# Patient Record
Sex: Male | Born: 1945 | Race: White | Hispanic: No | Marital: Married | Smoking: Never smoker
Health system: Southern US, Community
[De-identification: ages and names within clinical notes are randomized; demographics above are authoritative.]

## PROBLEM LIST (undated history)

## (undated) DIAGNOSIS — I499 Cardiac arrhythmia, unspecified: Secondary | ICD-10-CM

## (undated) DIAGNOSIS — Z9889 Other specified postprocedural states: Secondary | ICD-10-CM

## (undated) DIAGNOSIS — F32A Depression, unspecified: Secondary | ICD-10-CM

## (undated) DIAGNOSIS — I2510 Atherosclerotic heart disease of native coronary artery without angina pectoris: Secondary | ICD-10-CM

## (undated) DIAGNOSIS — Z8719 Personal history of other diseases of the digestive system: Secondary | ICD-10-CM

## (undated) DIAGNOSIS — F329 Major depressive disorder, single episode, unspecified: Secondary | ICD-10-CM

## (undated) DIAGNOSIS — E785 Hyperlipidemia, unspecified: Secondary | ICD-10-CM

## (undated) HISTORY — DX: Other specified postprocedural states: Z98.890

## (undated) HISTORY — DX: Atherosclerotic heart disease of native coronary artery without angina pectoris: I25.10

## (undated) HISTORY — DX: Depression, unspecified: F32.A

## (undated) HISTORY — DX: Hyperlipidemia, unspecified: E78.5

## (undated) HISTORY — DX: Personal history of other diseases of the digestive system: Z87.19

## (undated) HISTORY — DX: Cardiac arrhythmia, unspecified: I49.9

## (undated) HISTORY — PX: CARDIAC CATHETERIZATION: SHX172

## (undated) HISTORY — PX: CHOLECYSTECTOMY: SHX55

## (undated) HISTORY — DX: Major depressive disorder, single episode, unspecified: F32.9

## (undated) HISTORY — PX: HERNIA REPAIR: SHX51

---

## 1998-03-18 HISTORY — PX: CORONARY ARTERY BYPASS GRAFT: SHX141

## 1998-12-04 ENCOUNTER — Ambulatory Visit (HOSPITAL_COMMUNITY): Admission: RE | Admit: 1998-12-04 | Discharge: 1998-12-04 | Payer: Self-pay | Admitting: Gastroenterology

## 1999-07-31 ENCOUNTER — Emergency Department (HOSPITAL_COMMUNITY): Admission: EM | Admit: 1999-07-31 | Discharge: 1999-07-31 | Payer: Self-pay | Admitting: Emergency Medicine

## 2001-12-07 ENCOUNTER — Ambulatory Visit (HOSPITAL_COMMUNITY): Admission: RE | Admit: 2001-12-07 | Discharge: 2001-12-08 | Payer: Self-pay | Admitting: Cardiovascular Disease

## 2001-12-17 ENCOUNTER — Encounter: Payer: Self-pay | Admitting: Surgery

## 2001-12-21 ENCOUNTER — Encounter: Payer: Self-pay | Admitting: Surgery

## 2001-12-21 ENCOUNTER — Inpatient Hospital Stay (HOSPITAL_COMMUNITY): Admission: RE | Admit: 2001-12-21 | Discharge: 2001-12-26 | Payer: Self-pay | Admitting: Surgery

## 2001-12-22 ENCOUNTER — Encounter: Payer: Self-pay | Admitting: Surgery

## 2001-12-23 ENCOUNTER — Encounter: Payer: Self-pay | Admitting: Surgery

## 2002-01-25 ENCOUNTER — Encounter (HOSPITAL_COMMUNITY): Admission: RE | Admit: 2002-01-25 | Discharge: 2002-04-25 | Payer: Self-pay | Admitting: Cardiovascular Disease

## 2002-04-25 ENCOUNTER — Encounter (HOSPITAL_COMMUNITY): Admission: RE | Admit: 2002-04-25 | Discharge: 2002-07-24 | Payer: Self-pay | Admitting: Cardiovascular Disease

## 2002-08-17 ENCOUNTER — Encounter (HOSPITAL_COMMUNITY): Admission: RE | Admit: 2002-08-17 | Discharge: 2002-11-15 | Payer: Self-pay | Admitting: Cardiovascular Disease

## 2002-11-17 ENCOUNTER — Encounter (HOSPITAL_COMMUNITY): Admission: RE | Admit: 2002-11-17 | Discharge: 2003-02-15 | Payer: Self-pay | Admitting: Cardiovascular Disease

## 2003-02-16 ENCOUNTER — Encounter (HOSPITAL_COMMUNITY): Admission: RE | Admit: 2003-02-16 | Discharge: 2003-05-17 | Payer: Self-pay | Admitting: Cardiovascular Disease

## 2003-06-18 ENCOUNTER — Encounter (HOSPITAL_COMMUNITY): Admission: RE | Admit: 2003-06-18 | Discharge: 2003-09-16 | Payer: Self-pay | Admitting: Cardiovascular Disease

## 2003-09-20 ENCOUNTER — Encounter (HOSPITAL_COMMUNITY): Admission: RE | Admit: 2003-09-20 | Discharge: 2003-12-19 | Payer: Self-pay | Admitting: Cardiovascular Disease

## 2003-12-20 ENCOUNTER — Encounter (HOSPITAL_COMMUNITY): Admission: RE | Admit: 2003-12-20 | Discharge: 2004-03-19 | Payer: Self-pay | Admitting: Cardiovascular Disease

## 2004-03-20 ENCOUNTER — Encounter (HOSPITAL_COMMUNITY): Admission: RE | Admit: 2004-03-20 | Discharge: 2004-06-18 | Payer: Self-pay | Admitting: Cardiovascular Disease

## 2004-06-19 ENCOUNTER — Encounter (HOSPITAL_COMMUNITY): Admission: RE | Admit: 2004-06-19 | Discharge: 2004-09-17 | Payer: Self-pay | Admitting: Cardiovascular Disease

## 2004-09-18 ENCOUNTER — Encounter (HOSPITAL_COMMUNITY): Admission: RE | Admit: 2004-09-18 | Discharge: 2004-12-17 | Payer: Self-pay | Admitting: Cardiovascular Disease

## 2004-12-18 ENCOUNTER — Encounter (HOSPITAL_COMMUNITY): Admission: RE | Admit: 2004-12-18 | Discharge: 2005-03-17 | Payer: Self-pay | Admitting: Cardiovascular Disease

## 2005-03-19 ENCOUNTER — Encounter (HOSPITAL_COMMUNITY): Admission: RE | Admit: 2005-03-19 | Discharge: 2005-06-17 | Payer: Self-pay | Admitting: Cardiovascular Disease

## 2005-06-18 ENCOUNTER — Encounter (HOSPITAL_COMMUNITY): Admission: RE | Admit: 2005-06-18 | Discharge: 2005-09-16 | Payer: Self-pay | Admitting: Cardiovascular Disease

## 2005-09-17 ENCOUNTER — Encounter (HOSPITAL_COMMUNITY): Admission: RE | Admit: 2005-09-17 | Discharge: 2005-12-16 | Payer: Self-pay | Admitting: Cardiovascular Disease

## 2005-12-17 ENCOUNTER — Encounter (HOSPITAL_COMMUNITY): Admission: RE | Admit: 2005-12-17 | Discharge: 2006-03-17 | Payer: Self-pay | Admitting: Cardiovascular Disease

## 2006-03-18 ENCOUNTER — Encounter (HOSPITAL_COMMUNITY): Admission: RE | Admit: 2006-03-18 | Discharge: 2006-06-16 | Payer: Self-pay | Admitting: Cardiovascular Disease

## 2006-06-17 ENCOUNTER — Encounter (HOSPITAL_COMMUNITY): Admission: RE | Admit: 2006-06-17 | Discharge: 2006-09-15 | Payer: Self-pay | Admitting: Cardiovascular Disease

## 2006-09-16 ENCOUNTER — Encounter (HOSPITAL_COMMUNITY): Admission: RE | Admit: 2006-09-16 | Discharge: 2006-12-15 | Payer: Self-pay | Admitting: Cardiovascular Disease

## 2006-12-17 ENCOUNTER — Encounter (HOSPITAL_COMMUNITY): Admission: RE | Admit: 2006-12-17 | Discharge: 2007-03-17 | Payer: Self-pay | Admitting: Cardiovascular Disease

## 2007-03-19 ENCOUNTER — Encounter (HOSPITAL_COMMUNITY): Admission: RE | Admit: 2007-03-19 | Discharge: 2007-04-17 | Payer: Self-pay | Admitting: Cardiovascular Disease

## 2007-04-19 ENCOUNTER — Encounter (HOSPITAL_COMMUNITY): Admission: RE | Admit: 2007-04-19 | Discharge: 2007-07-18 | Payer: Self-pay | Admitting: Cardiovascular Disease

## 2007-07-19 ENCOUNTER — Encounter (HOSPITAL_COMMUNITY): Admission: RE | Admit: 2007-07-19 | Discharge: 2007-10-17 | Payer: Self-pay | Admitting: Cardiovascular Disease

## 2007-10-18 ENCOUNTER — Encounter (HOSPITAL_COMMUNITY): Admission: RE | Admit: 2007-10-18 | Discharge: 2008-01-16 | Payer: Self-pay | Admitting: Cardiovascular Disease

## 2008-01-18 ENCOUNTER — Encounter (HOSPITAL_COMMUNITY): Admission: RE | Admit: 2008-01-18 | Discharge: 2008-04-17 | Payer: Self-pay | Admitting: Cardiovascular Disease

## 2008-04-18 ENCOUNTER — Encounter (HOSPITAL_COMMUNITY): Admission: RE | Admit: 2008-04-18 | Discharge: 2008-07-17 | Payer: Self-pay | Admitting: Cardiovascular Disease

## 2008-07-18 ENCOUNTER — Encounter (HOSPITAL_COMMUNITY): Admission: RE | Admit: 2008-07-18 | Discharge: 2008-10-16 | Payer: Self-pay | Admitting: Cardiovascular Disease

## 2008-10-17 ENCOUNTER — Encounter (HOSPITAL_COMMUNITY): Admission: RE | Admit: 2008-10-17 | Discharge: 2009-01-15 | Payer: Self-pay | Admitting: Cardiovascular Disease

## 2009-01-16 ENCOUNTER — Encounter (HOSPITAL_COMMUNITY): Admission: RE | Admit: 2009-01-16 | Discharge: 2009-04-16 | Payer: Self-pay | Admitting: Cardiovascular Disease

## 2009-04-18 ENCOUNTER — Encounter (HOSPITAL_COMMUNITY): Admission: RE | Admit: 2009-04-18 | Discharge: 2009-07-17 | Payer: Self-pay | Admitting: Cardiovascular Disease

## 2009-07-18 ENCOUNTER — Encounter (HOSPITAL_COMMUNITY): Admission: RE | Admit: 2009-07-18 | Discharge: 2009-10-16 | Payer: Self-pay | Admitting: Cardiovascular Disease

## 2009-10-17 ENCOUNTER — Encounter (HOSPITAL_COMMUNITY): Admission: RE | Admit: 2009-10-17 | Discharge: 2010-01-15 | Payer: Self-pay | Admitting: Cardiovascular Disease

## 2009-12-27 ENCOUNTER — Ambulatory Visit: Payer: Self-pay | Admitting: Cardiovascular Disease

## 2010-01-16 ENCOUNTER — Encounter (HOSPITAL_COMMUNITY)
Admission: RE | Admit: 2010-01-16 | Discharge: 2010-04-16 | Payer: Self-pay | Source: Home / Self Care | Attending: Cardiovascular Disease | Admitting: Cardiovascular Disease

## 2010-02-26 IMAGING — CR DG CHEST 2V
2 series · 2 of 2 positions shown · non-contrast
Comparison: None.

CLINICAL DATA: Preop for right inguinal hernia repair

CHEST - 2 VIEW

[w chest pa]
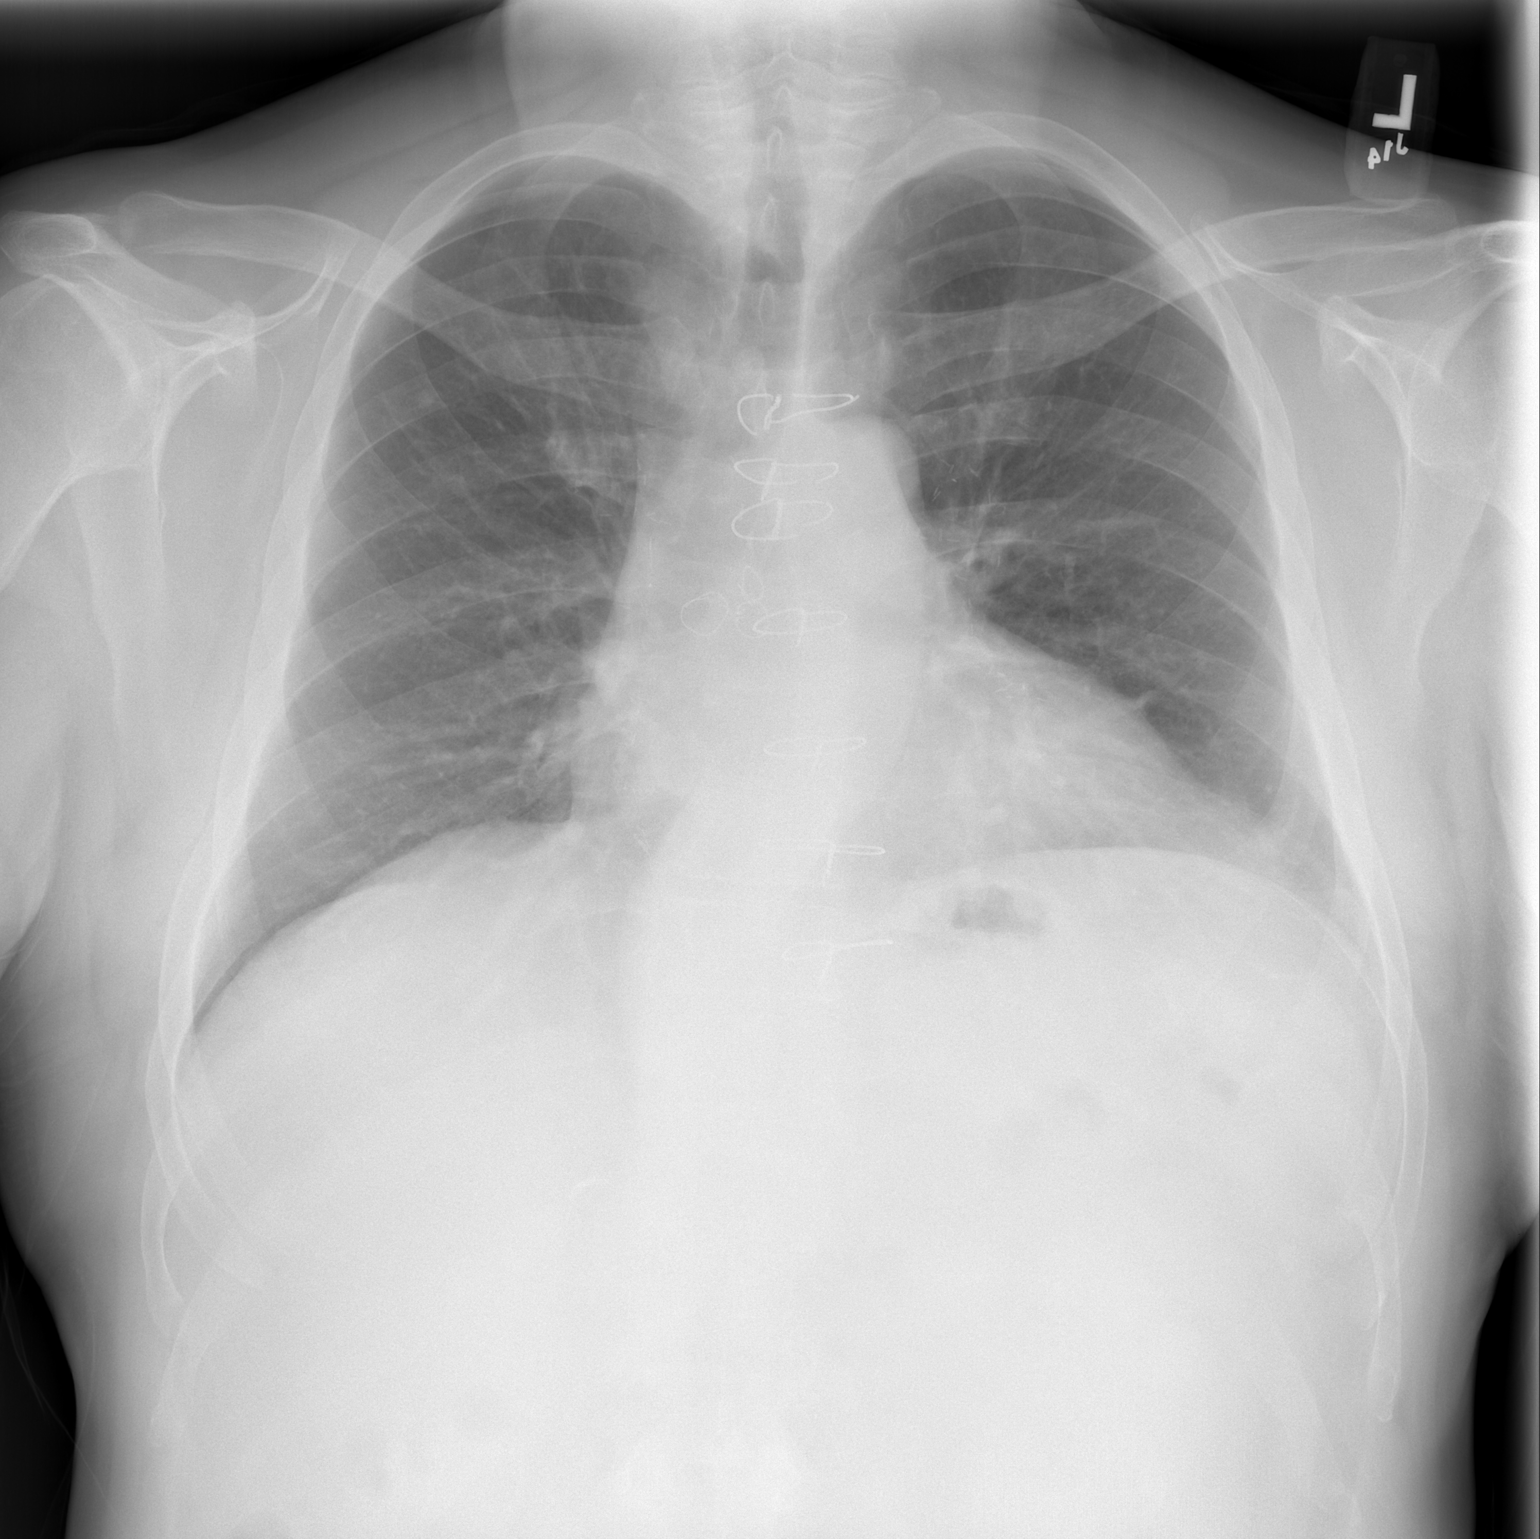

[w chest lat]
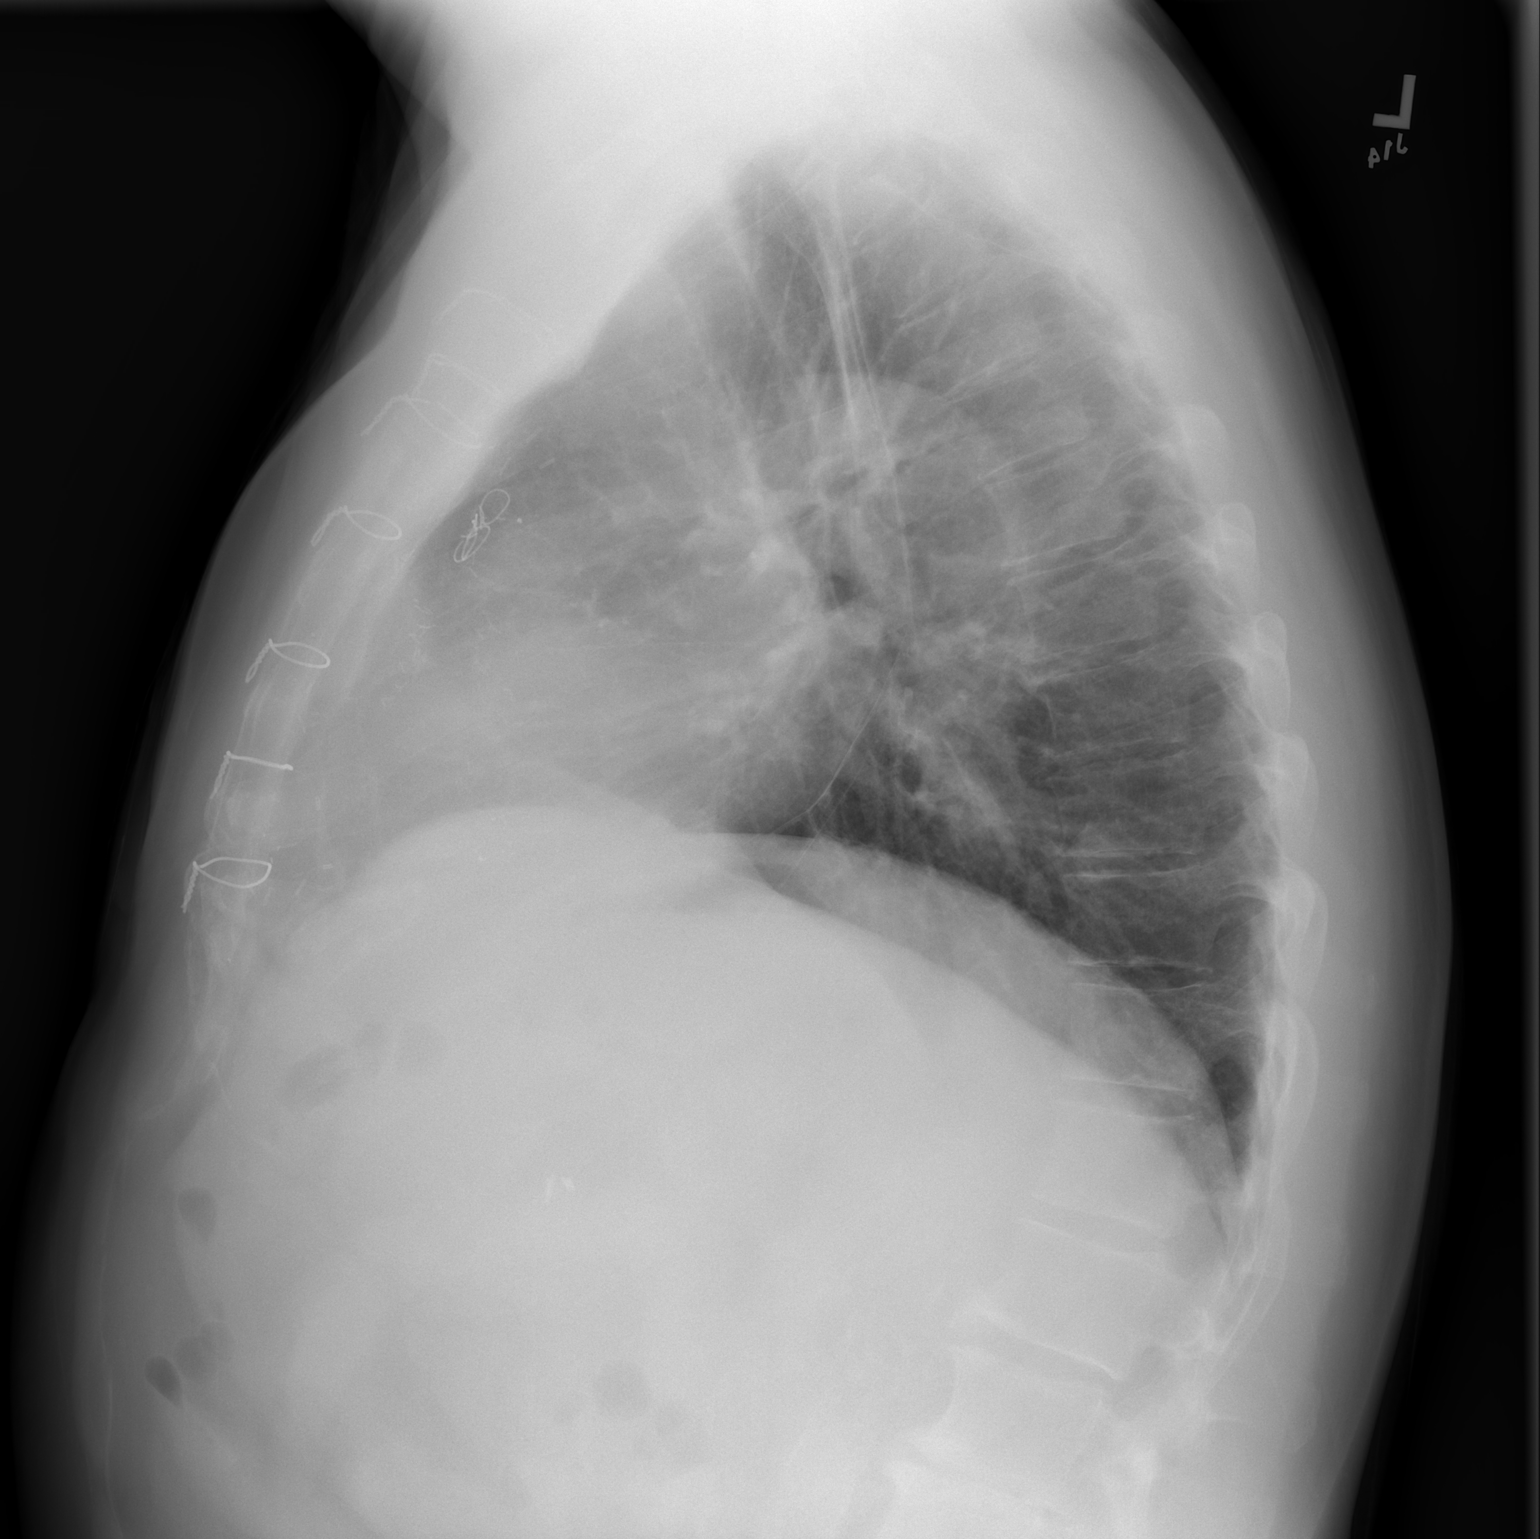

[2 of 2 positions shown; findings below may reference images not displayed]

FINDINGS: Post CABG.  Heart size within normal limits.  Aorta
tortuous.  Lungs clear.  Osseous structures intact.
IMPRESSION: Post CABG - no active disease.

## 2010-02-28 ENCOUNTER — Ambulatory Visit (HOSPITAL_COMMUNITY)
Admission: RE | Admit: 2010-02-28 | Discharge: 2010-02-28 | Payer: Self-pay | Source: Home / Self Care | Attending: Surgery | Admitting: Surgery

## 2010-04-18 ENCOUNTER — Ambulatory Visit (HOSPITAL_COMMUNITY): Payer: Medicare Other | Attending: Cardiovascular Disease

## 2010-04-19 ENCOUNTER — Encounter (HOSPITAL_COMMUNITY): Payer: Self-pay

## 2010-04-19 ENCOUNTER — Ambulatory Visit (HOSPITAL_COMMUNITY): Payer: Self-pay

## 2010-04-23 ENCOUNTER — Encounter (HOSPITAL_COMMUNITY): Payer: Medicare Other | Attending: Cardiovascular Disease

## 2010-04-23 DIAGNOSIS — I2510 Atherosclerotic heart disease of native coronary artery without angina pectoris: Secondary | ICD-10-CM | POA: Insufficient documentation

## 2010-04-23 DIAGNOSIS — Z8249 Family history of ischemic heart disease and other diseases of the circulatory system: Secondary | ICD-10-CM | POA: Insufficient documentation

## 2010-04-23 DIAGNOSIS — E669 Obesity, unspecified: Secondary | ICD-10-CM | POA: Insufficient documentation

## 2010-04-23 DIAGNOSIS — E7800 Pure hypercholesterolemia, unspecified: Secondary | ICD-10-CM | POA: Insufficient documentation

## 2010-04-23 DIAGNOSIS — Z951 Presence of aortocoronary bypass graft: Secondary | ICD-10-CM | POA: Insufficient documentation

## 2010-04-23 DIAGNOSIS — I10 Essential (primary) hypertension: Secondary | ICD-10-CM | POA: Insufficient documentation

## 2010-04-23 DIAGNOSIS — Z5189 Encounter for other specified aftercare: Secondary | ICD-10-CM | POA: Insufficient documentation

## 2010-04-24 ENCOUNTER — Encounter (HOSPITAL_COMMUNITY): Payer: Self-pay | Attending: Cardiovascular Disease

## 2010-04-25 ENCOUNTER — Encounter (HOSPITAL_COMMUNITY): Payer: Self-pay

## 2010-04-26 ENCOUNTER — Encounter (HOSPITAL_COMMUNITY): Payer: Self-pay

## 2010-05-01 ENCOUNTER — Encounter (HOSPITAL_COMMUNITY): Payer: Self-pay

## 2010-05-02 ENCOUNTER — Encounter (HOSPITAL_COMMUNITY): Payer: Self-pay

## 2010-05-03 ENCOUNTER — Encounter (HOSPITAL_COMMUNITY): Payer: Self-pay

## 2010-05-04 ENCOUNTER — Encounter (HOSPITAL_COMMUNITY): Payer: Self-pay

## 2010-05-08 ENCOUNTER — Encounter (HOSPITAL_COMMUNITY): Payer: Self-pay

## 2010-05-09 ENCOUNTER — Encounter (HOSPITAL_COMMUNITY): Payer: Self-pay

## 2010-05-10 ENCOUNTER — Encounter (HOSPITAL_COMMUNITY): Payer: Self-pay

## 2010-05-15 ENCOUNTER — Encounter (HOSPITAL_COMMUNITY): Payer: Self-pay

## 2010-05-16 ENCOUNTER — Encounter (HOSPITAL_COMMUNITY): Payer: Self-pay

## 2010-05-17 ENCOUNTER — Encounter (HOSPITAL_COMMUNITY): Payer: Self-pay | Attending: Cardiovascular Disease

## 2010-05-17 DIAGNOSIS — I10 Essential (primary) hypertension: Secondary | ICD-10-CM | POA: Insufficient documentation

## 2010-05-17 DIAGNOSIS — I2510 Atherosclerotic heart disease of native coronary artery without angina pectoris: Secondary | ICD-10-CM | POA: Insufficient documentation

## 2010-05-17 DIAGNOSIS — E669 Obesity, unspecified: Secondary | ICD-10-CM | POA: Insufficient documentation

## 2010-05-17 DIAGNOSIS — Z5189 Encounter for other specified aftercare: Secondary | ICD-10-CM | POA: Insufficient documentation

## 2010-05-17 DIAGNOSIS — Z951 Presence of aortocoronary bypass graft: Secondary | ICD-10-CM | POA: Insufficient documentation

## 2010-05-17 DIAGNOSIS — Z8249 Family history of ischemic heart disease and other diseases of the circulatory system: Secondary | ICD-10-CM | POA: Insufficient documentation

## 2010-05-17 DIAGNOSIS — E7800 Pure hypercholesterolemia, unspecified: Secondary | ICD-10-CM | POA: Insufficient documentation

## 2010-05-21 ENCOUNTER — Encounter (HOSPITAL_COMMUNITY): Payer: Self-pay

## 2010-05-22 ENCOUNTER — Encounter (HOSPITAL_COMMUNITY): Payer: Self-pay

## 2010-05-23 ENCOUNTER — Encounter (HOSPITAL_COMMUNITY): Payer: Self-pay

## 2010-05-24 ENCOUNTER — Encounter (HOSPITAL_COMMUNITY): Payer: Self-pay

## 2010-05-29 ENCOUNTER — Encounter (HOSPITAL_COMMUNITY): Payer: Self-pay

## 2010-05-29 LAB — BASIC METABOLIC PANEL
BUN: 14 mg/dL (ref 6–23)
CO2: 28 mEq/L (ref 19–32)
Calcium: 9.4 mg/dL (ref 8.4–10.5)
Chloride: 105 mEq/L (ref 96–112)
Creatinine, Ser: 0.88 mg/dL (ref 0.4–1.5)
GFR calc Af Amer: >60 mL/min (ref >60)
GFR calc non Af Amer: >60 mL/min (ref >60)
Glucose, Bld: 146 mg/dL — ABNORMAL HIGH (ref 70–99)
Potassium: 4 mEq/L (ref 3.5–5.1)
Sodium: 139 mEq/L (ref 135–145)

## 2010-05-29 LAB — SURGICAL PCR SCREEN
MRSA, PCR: NEGATIVE
Staphylococcus aureus: POSITIVE — AB

## 2010-05-30 ENCOUNTER — Encounter (HOSPITAL_COMMUNITY): Payer: Self-pay

## 2010-05-31 ENCOUNTER — Encounter (HOSPITAL_COMMUNITY): Payer: Self-pay

## 2010-06-01 ENCOUNTER — Encounter (HOSPITAL_COMMUNITY): Payer: Self-pay

## 2010-06-05 ENCOUNTER — Encounter (HOSPITAL_COMMUNITY): Payer: Self-pay

## 2010-06-07 ENCOUNTER — Encounter (HOSPITAL_COMMUNITY): Payer: Self-pay

## 2010-06-08 ENCOUNTER — Encounter (HOSPITAL_COMMUNITY): Payer: Self-pay

## 2010-06-12 ENCOUNTER — Encounter (HOSPITAL_COMMUNITY): Payer: Self-pay

## 2010-06-13 ENCOUNTER — Encounter (HOSPITAL_COMMUNITY): Payer: Self-pay

## 2010-06-14 ENCOUNTER — Encounter (HOSPITAL_COMMUNITY): Payer: Self-pay

## 2010-06-19 ENCOUNTER — Encounter (HOSPITAL_COMMUNITY): Payer: Self-pay | Attending: Cardiovascular Disease

## 2010-06-19 DIAGNOSIS — Z8249 Family history of ischemic heart disease and other diseases of the circulatory system: Secondary | ICD-10-CM | POA: Insufficient documentation

## 2010-06-19 DIAGNOSIS — E669 Obesity, unspecified: Secondary | ICD-10-CM | POA: Insufficient documentation

## 2010-06-19 DIAGNOSIS — Z5189 Encounter for other specified aftercare: Secondary | ICD-10-CM | POA: Insufficient documentation

## 2010-06-19 DIAGNOSIS — E7800 Pure hypercholesterolemia, unspecified: Secondary | ICD-10-CM | POA: Insufficient documentation

## 2010-06-19 DIAGNOSIS — I2510 Atherosclerotic heart disease of native coronary artery without angina pectoris: Secondary | ICD-10-CM | POA: Insufficient documentation

## 2010-06-19 DIAGNOSIS — I10 Essential (primary) hypertension: Secondary | ICD-10-CM | POA: Insufficient documentation

## 2010-06-19 DIAGNOSIS — Z951 Presence of aortocoronary bypass graft: Secondary | ICD-10-CM | POA: Insufficient documentation

## 2010-06-20 ENCOUNTER — Encounter (HOSPITAL_COMMUNITY): Payer: Self-pay

## 2010-06-21 ENCOUNTER — Encounter (HOSPITAL_COMMUNITY): Payer: Self-pay

## 2010-06-26 ENCOUNTER — Encounter (HOSPITAL_COMMUNITY): Payer: Self-pay

## 2010-06-27 ENCOUNTER — Encounter (HOSPITAL_COMMUNITY): Payer: Self-pay

## 2010-06-28 ENCOUNTER — Encounter (HOSPITAL_COMMUNITY): Payer: Self-pay

## 2010-07-02 ENCOUNTER — Encounter: Payer: Self-pay | Admitting: *Deleted

## 2010-07-03 ENCOUNTER — Encounter: Payer: Self-pay | Admitting: Cardiovascular Disease

## 2010-07-03 ENCOUNTER — Encounter (HOSPITAL_COMMUNITY): Payer: Self-pay

## 2010-07-03 ENCOUNTER — Ambulatory Visit (INDEPENDENT_AMBULATORY_CARE_PROVIDER_SITE_OTHER): Payer: Medicare Other | Admitting: Cardiovascular Disease

## 2010-07-03 VITALS — BP 132/70 | HR 60 | Wt 186.0 lb

## 2010-07-03 DIAGNOSIS — F419 Anxiety disorder, unspecified: Secondary | ICD-10-CM | POA: Insufficient documentation

## 2010-07-03 DIAGNOSIS — F411 Generalized anxiety disorder: Secondary | ICD-10-CM

## 2010-07-03 DIAGNOSIS — E785 Hyperlipidemia, unspecified: Secondary | ICD-10-CM | POA: Insufficient documentation

## 2010-07-03 DIAGNOSIS — I2510 Atherosclerotic heart disease of native coronary artery without angina pectoris: Secondary | ICD-10-CM | POA: Insufficient documentation

## 2010-07-03 NOTE — Progress Notes (Signed)
Fransisca Kaufmann Date of Birth  [birth year] [facility] / [facility] [address]     [address] [place], [place]  [address] [phone]  Fax  [phone]  History of Present Illness:  Pharoah is a middle-aged gentleman with a history of coronary artery disease. He status post CABG. He has a history of dyslipidemia and atrial fibrillation. He also has problems with depression. He denies any chest pain or shortness breath. He still works out at the right MCA and is anticipating in cardiac rehabilitation.  Current Outpatient Prescriptions on File Prior to Visit  Medication Sig Dispense Refill  . aspirin 325 MG EC tablet Take 325 mg by mouth daily.        Marland Kitchen atorvastatin (LIPITOR) 20 MG tablet Take 20 mg by mouth daily.        . fish oil-omega-3 fatty acids 1000 MG capsule Take 1 g by mouth daily.       . Flaxseed, Linseed, (FLAX SEED OIL PO) Take by mouth daily.        . irbesartan (AVAPRO) 75 MG tablet Take 75 mg by mouth at bedtime.        . Multiple Vitamin (MULTIVITAMIN PO) Take by mouth daily.        . ranitidine (ZANTAC) 150 MG capsule Take 150 mg by mouth every evening.        . TRAZODONE HCL PO Take 50 mg by mouth at bedtime as needed.        Marland Kitchen DISCONTD: atenolol (TENORMIN) 25 MG tablet Take 25 mg by mouth daily.          Allergies  Allergen Reactions  . Lisinopril   . Penicillins     Past Medical History  Diagnosis Date  . Hyperlipidemia   . Arrhythmia     afib  . Depression   . Coronary artery disease     2003  . S/P hernia surgery     Past Surgical History  Procedure Date  . Hernia repair   . Cholecystectomy   . Cardiac catheterization     11/2001  . Coronary artery bypass graft 2000    History  Smoking status  . Never Smoker   Smokeless tobacco  . Not on file    History  Alcohol Use No    Family History  Problem Relation Age of Onset  . Heart disease Father     Reviw of Systems:  Reviewed in the HPI.  All other  systems are negative.  Physical Exam: BP 132/70  Pulse 60  Wt 186 lb (84.369 kg) The patient is alert and oriented x 3.  The mood and affect are normal.  The skin is warm and dry.  Color is normal.  The HEENT exam reveals that the sclera are nonicteric.  The mucous membranes are moist.  The carotids are 2+ without bruits.  There is no thyromegaly.  There is no JVD.  The lungs are clear.  The chest wall is non tender.  The heart exam reveals a regular rate with a normal S1 and S2.  There are no murmurs, gallops, or rubs.  The PMI is not displaced.   Abdominal exam reveals good bowel sounds.  There is no guarding or rebound.  There is no hepatosplenomegaly or tenderness.  There are no masses.  Exam of the legs reveal no clubbing, cyanosis, or edema.  The legs are without rashes.  The distal pulses are intact.  Cranial nerves II -  XII are intact.  Motor and sensory functions are intact.  The gait is normal.  Assessment / Plan:

## 2010-07-03 NOTE — Patient Instructions (Signed)
Call William Hernandez  [phone]) if you need to discuss your stress issues.

## 2010-07-03 NOTE — Assessment & Plan Note (Signed)
His lipid levels have been well controlled. We will check a lipid profile, basic metabolic profile, and hepatic profile in 6 months along with his office visit.

## 2010-07-03 NOTE — Assessment & Plan Note (Signed)
William Hernandez is doing quite well from a cardiac standpoint. He denies any chest pain or shortness of breath. He's having some chest wall pain that is probably due to costochondritis. He is not having any angina symptoms. We'll continue with his same medications. I'll see him again in 6 months.

## 2010-07-04 ENCOUNTER — Encounter (HOSPITAL_COMMUNITY): Payer: Self-pay

## 2010-07-05 ENCOUNTER — Encounter (HOSPITAL_COMMUNITY): Payer: Self-pay

## 2010-07-10 ENCOUNTER — Encounter (HOSPITAL_COMMUNITY): Payer: Self-pay

## 2010-07-11 ENCOUNTER — Encounter (HOSPITAL_COMMUNITY): Payer: Self-pay

## 2010-07-12 ENCOUNTER — Encounter (HOSPITAL_COMMUNITY): Payer: Self-pay

## 2010-07-17 ENCOUNTER — Encounter (HOSPITAL_COMMUNITY): Payer: Self-pay | Attending: Cardiovascular Disease

## 2010-07-17 DIAGNOSIS — E669 Obesity, unspecified: Secondary | ICD-10-CM | POA: Insufficient documentation

## 2010-07-17 DIAGNOSIS — E7800 Pure hypercholesterolemia, unspecified: Secondary | ICD-10-CM | POA: Insufficient documentation

## 2010-07-17 DIAGNOSIS — Z8249 Family history of ischemic heart disease and other diseases of the circulatory system: Secondary | ICD-10-CM | POA: Insufficient documentation

## 2010-07-17 DIAGNOSIS — I2510 Atherosclerotic heart disease of native coronary artery without angina pectoris: Secondary | ICD-10-CM | POA: Insufficient documentation

## 2010-07-17 DIAGNOSIS — Z951 Presence of aortocoronary bypass graft: Secondary | ICD-10-CM | POA: Insufficient documentation

## 2010-07-17 DIAGNOSIS — I10 Essential (primary) hypertension: Secondary | ICD-10-CM | POA: Insufficient documentation

## 2010-07-17 DIAGNOSIS — Z5189 Encounter for other specified aftercare: Secondary | ICD-10-CM | POA: Insufficient documentation

## 2010-07-18 ENCOUNTER — Encounter (HOSPITAL_COMMUNITY): Payer: Self-pay

## 2010-07-19 ENCOUNTER — Encounter (HOSPITAL_COMMUNITY): Payer: Self-pay

## 2010-07-24 ENCOUNTER — Encounter (HOSPITAL_COMMUNITY): Payer: Self-pay

## 2010-07-25 ENCOUNTER — Encounter (HOSPITAL_COMMUNITY): Payer: Self-pay

## 2010-07-26 ENCOUNTER — Encounter (HOSPITAL_COMMUNITY): Payer: Self-pay

## 2010-07-31 ENCOUNTER — Encounter (HOSPITAL_COMMUNITY): Payer: Self-pay

## 2010-08-01 ENCOUNTER — Encounter (HOSPITAL_COMMUNITY): Payer: Self-pay

## 2010-08-02 ENCOUNTER — Encounter (HOSPITAL_COMMUNITY): Payer: Self-pay

## 2010-08-03 NOTE — Discharge Summary (Signed)
NAME:  William Hernandez, William Hernandez NO.:  [id number]   MEDICAL RECORD NO.:  [id number]                   PATIENT TYPE:  INP   LOCATION:  2001                                 FACILITY:  [facility]   PHYSICIAN:  Evelene Croon, M.D.                  DATE OF BIRTH:  04-13-1945   DATE OF ADMISSION:  12/21/2001  DATE OF DISCHARGE:  12/26/2001                                 DISCHARGE SUMMARY   ADMISSION DIAGNOSIS:  Three vessel coronary artery disease.   PAST MEDICAL HISTORY:  1. Hypertension  2. Hypercholesterolemia  3. Surgical history includes left inguinal hernia repair  4. This patient is allergic to penicillin   DISCHARGE DIAGNOSIS:  Significant three vessel coronary artery disease,  status post coronary artery bypass grafting.   BRIEF HISTORY:  Mr. Heikes is a 65 year old gentleman.  In early September  2003 he developed severe right sided chest pain and sought medical  attention. His physician recommended a Cardiolite scan. This showed apical  attenuation of unclear significance.  Since his chest pains were recurrent,  he underwent cardiac catheterization on December 07, 2001. This revealed  significant three vessel coronary artery disease.  This was not amenable to  PCI, and therefore CVTS consultation was requested.  Mr. Holsomback was evaluated  by Dr. Evelene Croon, in the CVTS office on December 15, 2001.  After  examination of the patient revealed all available records including  catheterization films, Dr. Laneta Simmers recommended coronary artery bypass  grafting as the preferred treatment of choice for this gentleman.  The  procedure, risks and benefits were discussed with Mr. Legate and he agreed to  proceed.   HOSPITAL COURSE:  On December 21, 2001, Mr. Flanagin was electively admitted to  [facility] under the care of Dr. Evelene Croon.  He underwent the  following surgical procedure.  Coronary artery bypass grafting times six.  Grafts placed at the time of the  procedure were left internal mammary artery  grafted to the left anterior descending artery, right internal mammary  artery  grafted to the posterior descending artery, saphenous vein grafted  to the first diagonal artery, saphenous vein grafted to the second diagonal  artery, saphenous vein was grafted in a sequential fashion to the first  obtuse marginal and the second obtuse marginal arteries.  Vein was harvested  from bilateral thighs via the intra vein harvesting technique.  Mr. Goins  tolerated this procedure reasonably well, and was transferred in stable  condition to the SICU.   Mr. Czerniak has remained hemodynamically stable since surgery.  History of  postoperative course has been eventful only for some atrial fibrillation.  On the afternoon December 23, 2001 postoperative day two, Mr. Borboa  experienced recurrent atrial fibrillation with his heart rates 140 to 178  range, history of  blood pressure was stable, and he was essentially  asymptomatic.  Dr. Laneta Simmers initiated amiodarone for  treatment of his  arrhythmia. He also initiated anticoagulation therapy with Lovenox.  The  next day in the afternoon he did become symptomatic from RAF, Cardizem was  initiated in an attempt to lower his heart rate.  He converted back to  normal sinus rhythm later in the day and remained there.   On October 8, he also was noted to have a fever of 100.5. His chest x-ray  revealed consolidation in his right lower lobe.  Tequin antibiotic was  initiated.  He was afebrile by the morning of October 10, and the Tequin was  continued due to possible effect QTC with the amiodarone.   Overall, Mr. Bomar made very good progress in recovering from his surgery.  On the morning of October 11, on postoperative day five his vital signs were  stable and he was afebrile. His heart remained in normal sinus rhythm and  his lungs were clear.  His bowel and bladder habits were within normal  limits for him.  His  incisions were healing well, and his pain was well  controlled, and he was ambulating independently.  Mr. Parton was read for  discharge on the morning of December 26, 2001.   LABORATORY DATA:  Recent laboratory studies on October 8, CBC revealed white  blood cell count 11.3, hemoglobin 10.8, hematocrit 31.3, platelet count  126,000.  On December 24, 2001 chemistries revealed sodium 137, potassium 3.9,  chloride 100, CO2 28, BUN 11, creatinine 1.0, glucose 160, calcium 8.8.   CONDITION ON DISCHARGE:  Improved.   DISCHARGE INSTRUCTIONS:  Include medications coated aspirin 325 mg P.O. QD -  this was a new dose for him, Tylox one to two P.O. four to six hours prn for  moderate-to-severe pain or Tylenol one to two P.O. q. four to six hours prn  for mild-to-moderate pain.  Amiodarone 200 mg P.O. QD.  He is instructed to  resume his following home medications: atenolol 25 mg P.O. QD, ranitidine  150 mg P.O. q evening . Lipitor 10 mg P.O. q evening, trazodone 25 mg P.O. q  evening and a multivitamin daily.   ACTIVITY:  He has been asked to refrain from any driving or any heaving  lifting, pushing or pulling.  He has also been instructed to continue his  breathing exercises and daily walking.   DIET:  Should be low fat and low salt.   WOUND CARE:  He may shower with mild soap and water.  He has been instructed  to inspect his incisions daily. If they become red, hot, swollen, draining  or he has a fever of greater than 101 degrees Farenheit, he is to call Dr.  Sharee Pimple office.   FOLLOW UP:  1.Dr. Elease Hashimoto would like to see him in the office in  approximately two weeks. He has been asked to call to arrange the  appointment.  He will have a chest x-ray taken that day.  1. He has an appointment to see Dr. Laneta Simmers at the CVTS office in     approximately three weeks.  The office will call with the date and time    for that appointment. He has been asked to bring his chest x-ray from Dr.     Harvie Bridge  office to this appointment with Dr. Evelene Croon, M.D..  Evelene Croon, M.D.    BB/MEDQ  D:  02/01/2002  T:  02/02/2002  Job:  [id number]   cc:   Vesta Mixer, M.D.   Windle Guard, M.D.  [address]  [place], [place] [address]  Fax: [phone]

## 2010-08-03 NOTE — Cardiovascular Report (Signed)
NAME:  William Hernandez, William Hernandez NO.:  [id number]   MEDICAL RECORD NO.:  [id number]                   PATIENT TYPE:  OIB   LOCATION:  6525                                 FACILITY:  [facility]   PHYSICIAN:  Vesta Mixer, M.D.              DATE OF BIRTH:  1945/12/15   DATE OF PROCEDURE:  12/07/2001  DATE OF DISCHARGE:                              CARDIAC CATHETERIZATION   INDICATIONS:  The patient is a 65 year old gentleman with a history of chest  pain. For the past year he has had an abnormal Cardiolite study which has  revealed apical attenuation.  He is referred for a heart catheterization  after having worsening episodes of chest pain.   PROCEDURE:  Left heart catheterization with an attempted percutaneous  transluminal coronary angioplasty of his left anterior descending.   DESCRIPTION OF PROCEDURE:  The right femoral artery was easily cannulated  using the modified Seldinger technique.   HEMODYNAMICS:  The left ventricular pressure is 131/12 with an aortic  pressure of  131/68.   ANGIOGRAPHY:  His left main coronary artery has minor luminal  irregularities.   The left anterior descending artery has minor luminal irregularities  proximally.  The mid vessel is quite tortuous and then has an 80-90%  stenosis at the origin of the second diagonal vessel.  The remainder of the  LAD has only minor luminal irregularities.   The first diagonal vessel is a moderate to large vessel with no significant  irregularities. The second diagonal vessel originates at the site of the  tight LAD stenosis and is approximately 1 mm in size.   The left circumflex artery is a large and codominant vessel. There is a 50%  proximal stenosis.  This stenosis is quite eccentric.  In the middle of the  stenosis, the first obtuse marginal arises and is seen to be subtotally  occluded.  The circumflex marginal fills predominately through left to left  collateral filling.  The size  of this vessel is approximately 1.5 to 2 mm in  size.  The remainder of the left circumflex artery has minor luminal  irregularities.  There is a 40-50% stenosis at the distal end of the  circumflex artery prior to giving off a posterior descending artery.   The right coronary artery is small to moderate in size.  There is a 50-60%  stenosis in the proximal aspect of this vessel.  The vessel provides flow to  a moderate sized posterior descending artery.   LEFT VENTRICULOGRAM:  The left ventriculogram was performed in a 30 RAO  position. It reveals a mildly dilated left ventricle but with well preserved  left ventricular systolic function.  There is no mitral regurgitation.  Ejection fraction is approximately 50%.   PERCUTANEOUS TRANSLUMINAL CORONARY ANGIOPLASTY PROCEDURE:  The 6 French  sheath was traded out for a 7 [place] sheath.  A Judkins left 3.5 guide was  used  to engage the left main coronary artery.  We gave him 4500 units of  heparin followed by a double bolus Integrilin drip.  We made multiple passes  with various guide wires. We were not able to cross the mid LAD stenosis.  The wires kept preferentially fusing to go down the diagonal vessel.  After  multiple attempts and inducing some coronary spasm, we decided to stop the  procedure.  We  will review the films and consider an alternative approach.  The patient  does have a left circumflex marginal stenosis/occlusion and a moderate  stenosis in this right coronary artery.  We may need to consider coronary  artery bypass grafting.  We will discuss these options with the patient.                                                Vesta Mixer, M.D.    PJN/MEDQ  D:  12/07/2001  T:  12/08/2001  Job:  [id number]   cc:   [facility]   Windle Guard, M.D.

## 2010-08-03 NOTE — H&P (Signed)
NAME:  DELMORE, SEAR NO.:  [id number]   MEDICAL RECORD NO.:  [id number]                   PATIENT TYPE:  OIB   LOCATION:  6532                                 FACILITY:  [facility]   PHYSICIAN:  Vesta Mixer, M.D.              DATE OF BIRTH:  [birth year]   DATE OF ADMISSION:  12/07/2001  DATE OF DISCHARGE:                                HISTORY & PHYSICAL   HISTORY OF PRESENT ILLNESS:  The patient is a middle-aged gentleman with a  history of chest pain and an abnormal Cardiolite study.  He is referred for  cardiac catheterization for further evaluation of these abnormalities.   The patient is a middle-aged gentleman who has had some intermittent  episodes of chest pain over the years.  He had a stress Cardiolite study  approximately six months ago which was mildly abnormal.  He was found to  have apical attenuation consistent with either chest wall attenuation versus  a scar.   The patient has continued to have some intermittent episodes of chest pain  and arm pain.  These episodes of pain occur at various times and are not  necessarily associated with eating, drinking, changing position, or taking a  deep breath.  He notes these episodes of pain both with rest and exertion  and they typically last for several minutes.   MEDICATIONS:  1. Hydrochlorothiazide 25 mg a day.  2. Aspirin 325 mg a day.  3. Vitamin E and multivitamin once a day.  4. Celebrex as needed.  5. Garlic 300 mg a day.  6. Caltrate once a day.  7. Atenolol 25 mg a day.  8. Ranitidine 150 mg a day.  9. Trazodone 50 mg q.h.s.  10.      Lipitor q.h.s.   ALLERGIES:  PENICILLIN.   PAST MEDICAL HISTORY:  1. Hypertension.  2. Hypercholesterolemia.  3. History of chest pain.   SOCIAL HISTORY:  The patient does not smoke.  He does not drink alcohol.   FAMILY HISTORY:  Noncontributory.   REVIEW OF SYSTEMS:  Essentially negative.   PHYSICAL EXAMINATION:  GENERAL:  He is a  middle-aged gentleman in no acute  distress.  He is alert and oriented x3 and his mood and affect are normal.  VITAL SIGNS:  Weight 202, blood pressure 130/70, heart rate 80.  HEENT:  2+ carotids.  No bruits.  No JVD and no thyromegaly.  LUNGS:  Clear to auscultation.  HEART:  Regular rate and rhythm, S1 and S2 with no murmurs, rubs, or  gallops.  ABDOMEN:  Good bowel sounds and nontender.  EXTREMITIES:  No cyanosis, clubbing, or edema.  NEUROLOGY:  Nonfocal.   The patient presents with persistently abnormal Cardiolite study and some  episodes of chest pain.  The most recent Cardiolite study performed last  week revealed the persistent scar.  In addition, his left ventricular  systolic function has decreased  slightly.  For further evaluation, I would  prefer that we perform a cardiac catheterization. We have discussed the  risks, benefits, and options of heart catheterization. He understands and  agrees to proceed.                                               Vesta Mixer, M.D.    PJN/MEDQ  D:  12/04/2001  T:  12/07/2001  Job:  [id number]   cc:   Windle Guard, M.D.

## 2010-08-03 NOTE — Op Note (Signed)
NAME:  William Hernandez, William Hernandez NO.:  [id number]   MEDICAL RECORD NO.:  [id number]                   PATIENT TYPE:  INP   LOCATION:  2309                                 FACILITY:  [facility]   PHYSICIAN:  Evelene Croon, MD                    DATE OF BIRTH:  05-17-1945   DATE OF PROCEDURE:  12/21/2001  DATE OF DISCHARGE:                                 OPERATIVE REPORT   PREOPERATIVE DIAGNOSIS:  Severe three vessel coronary artery disease with  unstable angina.   POSTOPERATIVE DIAGNOSIS:  Severe three vessel coronary artery disease with  unstable angina.   OPERATION:  Median sternotomy, extracorporeal circulation, coronary artery  bypass graft surgery times six using a left internal mammary artery graft to  the left anterior descending coronary artery, with a right internal mammary  artery graft to the posterior descending branch of the right coronary  artery, a saphenous vein graft to the first diagonal branch of the left  anterior descending artery, a saphenous vein graft to the second diagonal  branch of the left anterior descending artery, and a sequential saphenous  vein graft to the first and second obtuse marginal branches of the left  circumflex coronary artery.  Endoscopic vein harvesting from both thighs.   SURGEON:  Evelene Croon, M.D.   ASSISTANT:  Debby Freiberg. Thomasena Edis, P.A.   ANESTHESIA:  General endotracheal   CLINICAL HISTORY:  This patient is a 65 year old gentleman with a several  year history of intermittent chest pain.  He developed severe right sided  chest pain on Labor Day radiating into the right neck.  He underwent a  stress Cardiolite scan that showed apical attenuation of unclear  significance.  Due to these recurring episodes of chest pain, he underwent  cardiac catheterization on December 07, 2001.  This showed significant  three vessel coronary artery disease.  The LAD had diffuse proximal 50%  stenosis.  There was a first diagonal  branch that had about 50% focal  stenosis proximally.  The second diagonal branch came off the LAD in an area  of 80% to 90% stenosis.  The left circumflex had subtotal occlusion on the  first marginal branch.  The distal left circumflex had 40 to 50% stenosis  before a second marginal branch.  The right coronary artery had diffuse  proximal stenosis of about 50 to 60%.  Left ventricular ejection fraction  was about 50% with mild ventricular dilatation.  There was no mitral  regurgitation.  After review of the angiogram and examination of the  patient, it was felt that coronary artery bypass graft surgery was the best  treatment.  I discussed the operative procedure with the patient, including  alternatives, benefits and risks including bleeding, blood transfusion,  infection, stroke, myocardial infarction, and death.  He understood and  agreed to proceed.   DESCRIPTION OF PROCEDURE:  The patient was taken to  the operating room and  placed on the table in the supine position.  After induction of general  endotracheal anesthesia, a  Foley catheter was placed in the bladder using  sterile technique.  Then the chest, abdomen and both lower extremities were  prepped and draped in the usual sterile manner.  The chest was entered  through a median sternotomy incision and the pericardium opened in the  midline.  Examination of the heart showed good ventricular contractility.  The ascending aorta had no palpable plaques in it.   Then the left internal mammary artery was harvested from the chest wall as a  pedicle graft.  This is a medium caliber vessel with excellent flow through  it.  At the same time, a segment of greater saphenous vein was harvested  from the right thigh.  Endoscopic vein harvest technique was used. Part of  this vein in the upper thigh was of sufficient caliber to use.  About the  mid thigh, the vein was small and very thin walled and not felt to be  suitable.  Therefore,  another section of saphenous vein was harvested from  the left leg using endoscopic vein harvest technique.  This vein was of  medium size and good quality.  Since there was limited vein to use, I  decided to harvest the right internal mammary artery as a pedicle graft so  that this could be placed to the posterior descending branch of the right  coronary artery.   Then the patient was heparinized and when an adequate clotting time was  achieved, the distal ascending aorta was cannulated using a 20 French aortic  cannula for arterial inflow.  Venous outflow was achieved using a two stage  venous cannula at the right atrial appendage.  Antegrade cardioplegia and  vent cannula was inserted in the aortic root.   The first distal anastomosis was performed to the first obtuse marginal  branch.  Internal diameter was 1.75 mm.  The conduit used was a segment of  greater saphenous vein.  The anastomosis was performed in a sequential side-  to-side manner using continuous 7-0 Prolene suture.  Flow was measured  through the graft and was excellent.   The second distal anastomosis was performed to the second marginal branch.  The internal diameter of this vessel was about 2 mm.  The conduit used was  the same segment of greater saphenous vein.  The anastomosis was performed  in a sequential end-to-side manner using continuous 7-0 Prolene suture.  Flow was measured through the graft and was excellent.  Another dose of  cardioplegia was given down the vein graft and in the aortic root.   A third distal anastomosis was performed in the first diagonal branch.  The  internal diameter of this vessel was about 1.75 mm.  The conduit used was a  second segment of greater saphenous vein.  The anastomosis was performed in  an end-to-side manner using continuous 7-0 Prolene suture.  Flow was  measured through the graft and was excellent.  The fourth distal anastomosis was performed in the second diagonal  branch.  The internal diameter was about 1.5 to 1.6 mm.  The conduit used was a third  segment of greater saphenous vein.  The anastomosis was performed in an end-  to-side manner using continuous 7-0 Prolene suture.  Flow was measured  through the graft and was excellent.  Then another dose of cardioplegia was  given down these vein grafts and in  the aortic root.   The fifth distal anastomosis was then performed to the posterior descending  branch of the right coronary artery.  The internal diameter of this vessel  was about 1.75 mm.  The conduit used was the right internal mammary graft  and this was brought through an opening in the right pericardium anterior to  the phrenic nerve.  It was anastomosed to the posterior descending branch in  an end-to-side manner using continuous 8-0 Prolene suture.  The pedicle was  tacked to epicardium with 6-0 Prolene sutures.   The sixth distal anastomosis was performed in the midportion of the left  anterior descending coronary artery.  The internal diameter of this vessel  was about 2 mm.  The conduit used was the left internal mammary graft and  this was brought through an opening in the left pericardium anterior to the  phrenic nerve.  It was anastomosed to the LAD in an end-to-side manner using  continuous 8-0 Prolene suture.  The pedicle was tacked to the epicardium  with 6-0 Prolene sutures.  The patient was rewarmed to 37 degrees Centigrade  and the clamp removed from the mammary pedicle.  There was rapid rewarming  of the ventricular septum and return of spontaneous ventricular  fibrillation.  The cross clamp was removed with a time of 80 minutes and the  patient defibrillating to sinus rhythm.   A partial occlusion clamp was placed in the aortic root and the three  proximal vein graft anastomoses were performed in an end-to-side manner  using continuous 6-0 Prolene suture.  The clamp was removed.  The vein graft  was deaired and clamps  removed from them.  The proximal and distal  anastomoses appeared hemostatic in line with vastus lateralis fascia.  Graft  markers were placed around the proximal anastomoses.  Two temporary  biventricular and right atrial pacing wires were placed and brought out  through the skin.   When the patient had rewarmed to 37 degrees Centigrade, he was weaned from  cardiopulmonary bypass.  Total bypass time was 125 minutes.  Cardiac  appeared excellent with cardiac output of 6 liters per minute.  Protamine  was given and venous and aortic cannulae were removed without difficulty.  Hemostasis was achieved.  Four chest tubes were placed with bilateral  pleural tubes, a tube in the posterior pericardium, and one in the anterior  mediastinum.  The pericardium was reapproximated to the heart.  Sternum was  closed with #6 stainless steel wire.  The fascia was closed with continuous #1 Vicryl suture.  Subcutaneous tissue was closed using continuous 2-0  Vicryl and the skin with 3-0 Vicryl subcuticular closure.  Lower extremity  vein harvest site was closed in layers in a similar manner.  The sponge,  needle and instrument counts were correct according to the scrub nurse.  Dry  sterile dressings were applied over the incisions around the chest tubes  which were hooked to Pleurovac suction.  The patient remained  hemodynamically stable and was transported to the SICU in guarded but stable  condition.                                               Evelene Croon, MD    BB/MEDQ  D:  12/21/2001  T:  12/22/2001  Job:  [id number]   cc:   Deloris Ping.  Nahser, M.D.   [facility]

## 2010-08-07 ENCOUNTER — Encounter (HOSPITAL_COMMUNITY): Payer: Self-pay

## 2010-08-08 ENCOUNTER — Encounter (HOSPITAL_COMMUNITY): Payer: Self-pay

## 2010-08-09 ENCOUNTER — Encounter (HOSPITAL_COMMUNITY): Payer: Self-pay

## 2010-08-14 ENCOUNTER — Encounter (HOSPITAL_COMMUNITY): Payer: Self-pay

## 2010-08-15 ENCOUNTER — Encounter (HOSPITAL_COMMUNITY): Payer: Self-pay

## 2010-08-16 ENCOUNTER — Encounter (HOSPITAL_COMMUNITY): Payer: Self-pay

## 2010-08-21 ENCOUNTER — Encounter (HOSPITAL_COMMUNITY): Payer: Self-pay | Attending: Cardiovascular Disease

## 2010-08-21 DIAGNOSIS — E669 Obesity, unspecified: Secondary | ICD-10-CM | POA: Insufficient documentation

## 2010-08-21 DIAGNOSIS — Z951 Presence of aortocoronary bypass graft: Secondary | ICD-10-CM | POA: Insufficient documentation

## 2010-08-21 DIAGNOSIS — I2510 Atherosclerotic heart disease of native coronary artery without angina pectoris: Secondary | ICD-10-CM | POA: Insufficient documentation

## 2010-08-21 DIAGNOSIS — Z5189 Encounter for other specified aftercare: Secondary | ICD-10-CM | POA: Insufficient documentation

## 2010-08-21 DIAGNOSIS — I10 Essential (primary) hypertension: Secondary | ICD-10-CM | POA: Insufficient documentation

## 2010-08-21 DIAGNOSIS — Z8249 Family history of ischemic heart disease and other diseases of the circulatory system: Secondary | ICD-10-CM | POA: Insufficient documentation

## 2010-08-21 DIAGNOSIS — E7800 Pure hypercholesterolemia, unspecified: Secondary | ICD-10-CM | POA: Insufficient documentation

## 2010-08-22 ENCOUNTER — Encounter (HOSPITAL_COMMUNITY): Payer: Self-pay

## 2010-08-23 ENCOUNTER — Encounter (HOSPITAL_COMMUNITY): Payer: Self-pay

## 2010-08-28 ENCOUNTER — Encounter (HOSPITAL_COMMUNITY): Payer: Self-pay

## 2010-08-29 ENCOUNTER — Encounter (HOSPITAL_COMMUNITY): Payer: Self-pay

## 2010-08-30 ENCOUNTER — Encounter (HOSPITAL_COMMUNITY): Payer: Self-pay

## 2010-09-04 ENCOUNTER — Encounter (HOSPITAL_COMMUNITY): Payer: Self-pay

## 2010-09-05 ENCOUNTER — Encounter (HOSPITAL_COMMUNITY): Payer: Self-pay

## 2010-09-06 ENCOUNTER — Encounter (HOSPITAL_COMMUNITY): Payer: Self-pay

## 2010-09-07 ENCOUNTER — Encounter (HOSPITAL_COMMUNITY): Payer: Medicare Other | Attending: Cardiovascular Disease

## 2010-09-11 ENCOUNTER — Encounter (HOSPITAL_COMMUNITY): Payer: Self-pay

## 2010-09-12 ENCOUNTER — Encounter (HOSPITAL_COMMUNITY): Payer: Self-pay

## 2010-09-13 ENCOUNTER — Encounter (HOSPITAL_COMMUNITY): Payer: Self-pay

## 2010-09-14 ENCOUNTER — Encounter (HOSPITAL_COMMUNITY): Payer: Medicare Other | Attending: Cardiovascular Disease

## 2010-09-18 ENCOUNTER — Encounter (HOSPITAL_COMMUNITY): Payer: Self-pay | Attending: Cardiovascular Disease

## 2010-09-18 DIAGNOSIS — E669 Obesity, unspecified: Secondary | ICD-10-CM | POA: Insufficient documentation

## 2010-09-18 DIAGNOSIS — Z8249 Family history of ischemic heart disease and other diseases of the circulatory system: Secondary | ICD-10-CM | POA: Insufficient documentation

## 2010-09-18 DIAGNOSIS — Z5189 Encounter for other specified aftercare: Secondary | ICD-10-CM | POA: Insufficient documentation

## 2010-09-18 DIAGNOSIS — E7800 Pure hypercholesterolemia, unspecified: Secondary | ICD-10-CM | POA: Insufficient documentation

## 2010-09-18 DIAGNOSIS — Z951 Presence of aortocoronary bypass graft: Secondary | ICD-10-CM | POA: Insufficient documentation

## 2010-09-18 DIAGNOSIS — I2510 Atherosclerotic heart disease of native coronary artery without angina pectoris: Secondary | ICD-10-CM | POA: Insufficient documentation

## 2010-09-18 DIAGNOSIS — I10 Essential (primary) hypertension: Secondary | ICD-10-CM | POA: Insufficient documentation

## 2010-09-19 ENCOUNTER — Encounter (HOSPITAL_COMMUNITY): Payer: Self-pay

## 2010-09-20 ENCOUNTER — Encounter (HOSPITAL_COMMUNITY): Payer: Self-pay

## 2010-09-25 ENCOUNTER — Encounter (HOSPITAL_COMMUNITY): Payer: Self-pay

## 2010-09-26 ENCOUNTER — Encounter (HOSPITAL_COMMUNITY): Payer: Self-pay

## 2010-09-27 ENCOUNTER — Encounter (HOSPITAL_COMMUNITY): Payer: Self-pay

## 2010-10-02 ENCOUNTER — Encounter (HOSPITAL_COMMUNITY): Payer: Self-pay

## 2010-10-03 ENCOUNTER — Encounter (HOSPITAL_COMMUNITY): Payer: Self-pay

## 2010-10-04 ENCOUNTER — Encounter (HOSPITAL_COMMUNITY): Payer: Self-pay

## 2010-10-09 ENCOUNTER — Encounter (HOSPITAL_COMMUNITY): Payer: Self-pay

## 2010-10-10 ENCOUNTER — Encounter (HOSPITAL_COMMUNITY): Payer: Self-pay

## 2010-10-11 ENCOUNTER — Encounter (HOSPITAL_COMMUNITY): Payer: Self-pay

## 2010-10-16 ENCOUNTER — Encounter (HOSPITAL_COMMUNITY): Payer: Self-pay

## 2010-10-17 ENCOUNTER — Encounter (HOSPITAL_COMMUNITY): Payer: Self-pay | Attending: Cardiovascular Disease

## 2010-10-17 DIAGNOSIS — Z951 Presence of aortocoronary bypass graft: Secondary | ICD-10-CM | POA: Insufficient documentation

## 2010-10-17 DIAGNOSIS — E669 Obesity, unspecified: Secondary | ICD-10-CM | POA: Insufficient documentation

## 2010-10-17 DIAGNOSIS — Z8249 Family history of ischemic heart disease and other diseases of the circulatory system: Secondary | ICD-10-CM | POA: Insufficient documentation

## 2010-10-17 DIAGNOSIS — I10 Essential (primary) hypertension: Secondary | ICD-10-CM | POA: Insufficient documentation

## 2010-10-17 DIAGNOSIS — E7800 Pure hypercholesterolemia, unspecified: Secondary | ICD-10-CM | POA: Insufficient documentation

## 2010-10-17 DIAGNOSIS — I2510 Atherosclerotic heart disease of native coronary artery without angina pectoris: Secondary | ICD-10-CM | POA: Insufficient documentation

## 2010-10-17 DIAGNOSIS — Z5189 Encounter for other specified aftercare: Secondary | ICD-10-CM | POA: Insufficient documentation

## 2010-10-18 ENCOUNTER — Encounter (HOSPITAL_COMMUNITY): Payer: Self-pay

## 2010-10-23 ENCOUNTER — Encounter (HOSPITAL_COMMUNITY): Payer: Self-pay

## 2010-10-24 ENCOUNTER — Encounter (HOSPITAL_COMMUNITY): Payer: Self-pay

## 2010-10-25 ENCOUNTER — Encounter (HOSPITAL_COMMUNITY): Payer: Self-pay

## 2010-10-30 ENCOUNTER — Encounter (HOSPITAL_COMMUNITY): Payer: Self-pay

## 2010-10-31 ENCOUNTER — Encounter (HOSPITAL_COMMUNITY): Payer: Self-pay

## 2010-11-01 ENCOUNTER — Encounter (HOSPITAL_COMMUNITY): Payer: Self-pay

## 2010-11-06 ENCOUNTER — Encounter (HOSPITAL_COMMUNITY): Payer: Self-pay

## 2010-11-07 ENCOUNTER — Encounter (HOSPITAL_COMMUNITY): Payer: Self-pay

## 2010-11-08 ENCOUNTER — Encounter (HOSPITAL_COMMUNITY): Payer: Self-pay

## 2010-11-13 ENCOUNTER — Encounter (HOSPITAL_COMMUNITY): Payer: Self-pay

## 2010-11-14 ENCOUNTER — Encounter (HOSPITAL_COMMUNITY): Payer: Self-pay

## 2010-11-15 ENCOUNTER — Encounter (HOSPITAL_COMMUNITY): Payer: Self-pay

## 2010-11-20 ENCOUNTER — Encounter (HOSPITAL_COMMUNITY): Payer: Self-pay | Attending: Cardiovascular Disease

## 2010-11-20 DIAGNOSIS — I2510 Atherosclerotic heart disease of native coronary artery without angina pectoris: Secondary | ICD-10-CM | POA: Insufficient documentation

## 2010-11-20 DIAGNOSIS — Z5189 Encounter for other specified aftercare: Secondary | ICD-10-CM | POA: Insufficient documentation

## 2010-11-20 DIAGNOSIS — Z8249 Family history of ischemic heart disease and other diseases of the circulatory system: Secondary | ICD-10-CM | POA: Insufficient documentation

## 2010-11-20 DIAGNOSIS — Z951 Presence of aortocoronary bypass graft: Secondary | ICD-10-CM | POA: Insufficient documentation

## 2010-11-20 DIAGNOSIS — I10 Essential (primary) hypertension: Secondary | ICD-10-CM | POA: Insufficient documentation

## 2010-11-20 DIAGNOSIS — E669 Obesity, unspecified: Secondary | ICD-10-CM | POA: Insufficient documentation

## 2010-11-20 DIAGNOSIS — E7800 Pure hypercholesterolemia, unspecified: Secondary | ICD-10-CM | POA: Insufficient documentation

## 2010-11-21 ENCOUNTER — Encounter (HOSPITAL_COMMUNITY): Payer: Self-pay

## 2010-11-22 ENCOUNTER — Encounter (HOSPITAL_COMMUNITY): Payer: Self-pay

## 2010-11-26 ENCOUNTER — Other Ambulatory Visit: Payer: Self-pay | Admitting: Cardiology

## 2010-11-26 MED ORDER — ATORVASTATIN CALCIUM 20 MG PO TABS: ORAL_TABLET | ORAL | Status: DC

## 2010-11-26 NOTE — Telephone Encounter (Signed)
Med refill for lipitor °

## 2010-11-27 ENCOUNTER — Encounter (HOSPITAL_COMMUNITY): Payer: Self-pay

## 2010-11-28 ENCOUNTER — Encounter (HOSPITAL_COMMUNITY): Payer: Self-pay

## 2010-11-29 ENCOUNTER — Encounter (HOSPITAL_COMMUNITY): Payer: Self-pay

## 2010-12-04 ENCOUNTER — Encounter (HOSPITAL_COMMUNITY): Payer: Self-pay

## 2010-12-05 ENCOUNTER — Encounter (HOSPITAL_COMMUNITY): Payer: Self-pay

## 2010-12-06 ENCOUNTER — Encounter (HOSPITAL_COMMUNITY): Payer: Self-pay

## 2010-12-11 ENCOUNTER — Encounter (HOSPITAL_COMMUNITY): Payer: Self-pay

## 2010-12-12 ENCOUNTER — Encounter (HOSPITAL_COMMUNITY): Payer: Self-pay

## 2010-12-13 ENCOUNTER — Encounter (HOSPITAL_COMMUNITY): Payer: Self-pay

## 2010-12-14 ENCOUNTER — Encounter (HOSPITAL_COMMUNITY): Payer: Self-pay

## 2010-12-18 ENCOUNTER — Encounter (HOSPITAL_COMMUNITY): Payer: Self-pay | Attending: Cardiovascular Disease

## 2010-12-18 DIAGNOSIS — E7800 Pure hypercholesterolemia, unspecified: Secondary | ICD-10-CM | POA: Insufficient documentation

## 2010-12-18 DIAGNOSIS — E669 Obesity, unspecified: Secondary | ICD-10-CM | POA: Insufficient documentation

## 2010-12-18 DIAGNOSIS — I10 Essential (primary) hypertension: Secondary | ICD-10-CM | POA: Insufficient documentation

## 2010-12-18 DIAGNOSIS — Z951 Presence of aortocoronary bypass graft: Secondary | ICD-10-CM | POA: Insufficient documentation

## 2010-12-18 DIAGNOSIS — I2510 Atherosclerotic heart disease of native coronary artery without angina pectoris: Secondary | ICD-10-CM | POA: Insufficient documentation

## 2010-12-18 DIAGNOSIS — Z8249 Family history of ischemic heart disease and other diseases of the circulatory system: Secondary | ICD-10-CM | POA: Insufficient documentation

## 2010-12-18 DIAGNOSIS — Z5189 Encounter for other specified aftercare: Secondary | ICD-10-CM | POA: Insufficient documentation

## 2010-12-19 ENCOUNTER — Encounter (HOSPITAL_COMMUNITY): Payer: Self-pay

## 2010-12-20 ENCOUNTER — Encounter (HOSPITAL_COMMUNITY): Payer: Self-pay

## 2010-12-25 ENCOUNTER — Encounter (HOSPITAL_COMMUNITY): Payer: Self-pay

## 2010-12-26 ENCOUNTER — Encounter (HOSPITAL_COMMUNITY): Payer: Self-pay

## 2010-12-27 ENCOUNTER — Encounter (HOSPITAL_COMMUNITY): Payer: Self-pay

## 2011-01-01 ENCOUNTER — Encounter (HOSPITAL_COMMUNITY): Payer: Self-pay

## 2011-01-02 ENCOUNTER — Encounter (HOSPITAL_COMMUNITY): Payer: Self-pay

## 2011-01-03 ENCOUNTER — Encounter (HOSPITAL_COMMUNITY): Payer: Self-pay

## 2011-01-04 ENCOUNTER — Encounter (HOSPITAL_COMMUNITY): Payer: Self-pay

## 2011-01-08 ENCOUNTER — Encounter (HOSPITAL_COMMUNITY): Payer: Self-pay

## 2011-01-09 ENCOUNTER — Encounter (HOSPITAL_COMMUNITY): Payer: Self-pay

## 2011-01-10 ENCOUNTER — Encounter: Payer: Self-pay | Admitting: Cardiovascular Disease

## 2011-01-10 ENCOUNTER — Ambulatory Visit (INDEPENDENT_AMBULATORY_CARE_PROVIDER_SITE_OTHER): Payer: Medicare Other | Admitting: Cardiovascular Disease

## 2011-01-10 ENCOUNTER — Encounter (HOSPITAL_COMMUNITY): Payer: Self-pay

## 2011-01-10 ENCOUNTER — Other Ambulatory Visit (INDEPENDENT_AMBULATORY_CARE_PROVIDER_SITE_OTHER): Payer: Medicare Other | Admitting: *Deleted

## 2011-01-10 VITALS — BP 118/74 | HR 50 | Ht 69.0 in | Wt 190.0 lb

## 2011-01-10 DIAGNOSIS — I119 Hypertensive heart disease without heart failure: Secondary | ICD-10-CM

## 2011-01-10 DIAGNOSIS — I2510 Atherosclerotic heart disease of native coronary artery without angina pectoris: Secondary | ICD-10-CM

## 2011-01-10 DIAGNOSIS — F419 Anxiety disorder, unspecified: Secondary | ICD-10-CM

## 2011-01-10 DIAGNOSIS — E785 Hyperlipidemia, unspecified: Secondary | ICD-10-CM

## 2011-01-10 LAB — BASIC METABOLIC PANEL
CO2: 26 mEq/L (ref 19–32)
Chloride: 106 mEq/L (ref 96–112)
Potassium: 4.1 mEq/L (ref 3.5–5.1)
Sodium: 140 mEq/L (ref 135–145)

## 2011-01-10 LAB — LIPID PANEL
Cholesterol: 113 mg/dL (ref 0–200)
LDL Cholesterol: 57 mg/dL (ref 0–99)
Total CHOL/HDL Ratio: 3
Triglycerides: 52 mg/dL (ref 0.0–149.0)
VLDL: 10.4 mg/dL (ref 0.0–40.0)

## 2011-01-10 LAB — HEPATIC FUNCTION PANEL
Albumin: 4.2 g/dL (ref 3.5–5.2)
Total Protein: 7.1 g/dL (ref 6.0–8.3)

## 2011-01-10 NOTE — Assessment & Plan Note (Signed)
We'll check a lipid level in 6 months when I seen again for an office visit.

## 2011-01-10 NOTE — Assessment & Plan Note (Signed)
He'll continue to see Josue Hector  for help with his anxiety.

## 2011-01-10 NOTE — Assessment & Plan Note (Addendum)
He remains very stable from a cardiac standpoint. He's not having any episodes of angina. He walks on a regular basis and is participating in cardiac rehabilitation without any angina.  He has occasional episodes of chest pain that I think are due to anxiety. They typically occur at night when he's worried about something. These episodes never occur with rest.  I've reassured him that these do not sound cardiac in origin. We'll continue to follow him and I'll see him in 6 months.

## 2011-01-10 NOTE — Patient Instructions (Signed)
Will get labs today and call you with results.  Your physician wants you to follow-up in: 6 month You will receive a reminder letter in the mail two months in advance. If you don't receive a letter, please call our office to schedule the follow-up appointment.

## 2011-01-10 NOTE — Progress Notes (Signed)
William Hernandez Date of Birth  02-13-1946 [facility] [address]    [address] [place], [place]  [address] [phone]  Fax  [phone]  History of Present Illness:  William Hernandez is a 65 year old gentleman with a history of coronary disease-status post coronary artery bypass grafting.  He's done very well from a cardiac standpoint. He is participating in cardiac rehabilitation and has not had any episodes of chest pain or shortness breath.  He has some episodes of chest pressure that typically occur if he is sitting balance relaxing. This is a sister with a sensation of indigestion. He continues to have lots of problems with anxiety.  Current Outpatient Prescriptions on File Prior to Visit  Medication Sig Dispense Refill  . aspirin 325 MG EC tablet Take 325 mg by mouth daily.        Marland Kitchen atorvastatin (LIPITOR) 20 MG tablet Take one tablet by mouth once daily.   90 tablet  3  . fish oil-omega-3 fatty acids 1000 MG capsule Take 1 g by mouth daily.       . Flaxseed, Linseed, (FLAX SEED OIL PO) Take by mouth daily.        . irbesartan (AVAPRO) 75 MG tablet Take 75 mg by mouth at bedtime.        . Multiple Vitamin (MULTIVITAMIN PO) Take by mouth daily.        . nebivolol (BYSTOLIC) 5 MG tablet Take 5 mg by mouth daily.        . ranitidine (ZANTAC) 150 MG capsule Take 150 mg by mouth every evening.        . TRAZODONE HCL PO Take 50 mg by mouth at bedtime as needed.          Allergies  Allergen Reactions  . Lisinopril   . Penicillins     Past Medical History  Diagnosis Date  . Hyperlipidemia   . Arrhythmia     afib  . Depression   . Coronary artery disease     2003  . S/P hernia surgery     Past Surgical History  Procedure Date  . Hernia repair   . Cholecystectomy   . Cardiac catheterization     11/2001  . Coronary artery bypass graft 2000    History  Smoking status  . Never Smoker   Smokeless tobacco  . Not on file    History  Alcohol Use No    Family  History  Problem Relation Age of Onset  . Heart disease Father     Reviw of Systems:  Reviewed in the HPI.  All other systems are negative.  Physical Exam: BP 118/74  Pulse 50  Ht 5\' 9"  (1.753 m)  Wt 190 lb (86.183 kg)  BMI 28.06 kg/m2 The patient is alert and oriented x 3.  The mood and affect are normal.   Skin: warm and dry.  Color is normal.    HEENT:   normal  Lungs: clear   Heart: RR, no murmurs    Abdomen: soft, good BS  Extremities:  No edema  Neuro:  Non focal  ECG:  Assessment / Plan:

## 2011-01-14 ENCOUNTER — Telehealth: Payer: Self-pay | Admitting: Cardiovascular Disease

## 2011-01-14 NOTE — Telephone Encounter (Signed)
Pt calling for lab results from 01-10-11/mt

## 2011-01-14 NOTE — Telephone Encounter (Signed)
Lab results given, copy mailed to him and sent to pcp.

## 2011-01-15 ENCOUNTER — Encounter (HOSPITAL_COMMUNITY): Payer: Self-pay

## 2011-01-16 ENCOUNTER — Encounter (HOSPITAL_COMMUNITY): Payer: Self-pay

## 2011-01-17 ENCOUNTER — Encounter (HOSPITAL_COMMUNITY): Payer: Self-pay

## 2011-01-17 ENCOUNTER — Telehealth: Payer: Self-pay | Admitting: Cardiovascular Disease

## 2011-01-17 ENCOUNTER — Other Ambulatory Visit: Payer: Self-pay | Admitting: *Deleted

## 2011-01-17 DIAGNOSIS — Z951 Presence of aortocoronary bypass graft: Secondary | ICD-10-CM | POA: Insufficient documentation

## 2011-01-17 DIAGNOSIS — I10 Essential (primary) hypertension: Secondary | ICD-10-CM | POA: Insufficient documentation

## 2011-01-17 DIAGNOSIS — Z5189 Encounter for other specified aftercare: Secondary | ICD-10-CM | POA: Insufficient documentation

## 2011-01-17 DIAGNOSIS — Z8249 Family history of ischemic heart disease and other diseases of the circulatory system: Secondary | ICD-10-CM | POA: Insufficient documentation

## 2011-01-17 DIAGNOSIS — E7800 Pure hypercholesterolemia, unspecified: Secondary | ICD-10-CM | POA: Insufficient documentation

## 2011-01-17 DIAGNOSIS — I2510 Atherosclerotic heart disease of native coronary artery without angina pectoris: Secondary | ICD-10-CM | POA: Insufficient documentation

## 2011-01-17 DIAGNOSIS — E669 Obesity, unspecified: Secondary | ICD-10-CM | POA: Insufficient documentation

## 2011-01-17 NOTE — Telephone Encounter (Signed)
Concern with T. Bili being elevated, Please advise: when should he be concerned? Do you feel its elevated from lipitor? Does he need to F/U with specialist? Pt informed I will not call him back till Monday.

## 2011-01-17 NOTE — Telephone Encounter (Signed)
New message  Pt said he wanted to talk to you about lab test done last week Bilirubin test was high Please call

## 2011-01-18 NOTE — Telephone Encounter (Signed)
He should consult his primary md.  This is not likely to be due to statin.  thge AST and ALT are normal.

## 2011-01-21 ENCOUNTER — Other Ambulatory Visit: Payer: Self-pay | Admitting: *Deleted

## 2011-01-21 MED ORDER — IRBESARTAN 150 MG PO TABS: 75.0000 mg | ORAL_TABLET | Freq: Every day | ORAL | Status: DC

## 2011-01-21 NOTE — Telephone Encounter (Signed)
Advised f/u to pcp pt agreed to do so.

## 2011-01-22 ENCOUNTER — Encounter (HOSPITAL_COMMUNITY): Payer: Self-pay

## 2011-01-23 ENCOUNTER — Encounter (HOSPITAL_COMMUNITY): Payer: Self-pay

## 2011-01-24 ENCOUNTER — Encounter (HOSPITAL_COMMUNITY): Payer: Self-pay

## 2011-01-29 ENCOUNTER — Encounter (HOSPITAL_COMMUNITY): Payer: Self-pay

## 2011-01-30 ENCOUNTER — Encounter (HOSPITAL_COMMUNITY): Payer: Self-pay

## 2011-01-31 ENCOUNTER — Encounter (HOSPITAL_COMMUNITY): Payer: Self-pay

## 2011-02-05 ENCOUNTER — Encounter (HOSPITAL_COMMUNITY)
Admission: RE | Admit: 2011-02-05 | Discharge: 2011-02-05 | Disposition: A | Discharge status: Home / Self Care | Payer: Self-pay | Source: Ambulatory Visit | Attending: Cardiovascular Disease | Admitting: Cardiovascular Disease

## 2011-02-06 ENCOUNTER — Encounter (HOSPITAL_COMMUNITY)
Admission: RE | Admit: 2011-02-06 | Discharge: 2011-02-06 | Disposition: A | Discharge status: Home / Self Care | Payer: Self-pay | Source: Ambulatory Visit | Attending: Cardiovascular Disease | Admitting: Cardiovascular Disease

## 2011-02-07 ENCOUNTER — Encounter (HOSPITAL_COMMUNITY): Payer: Self-pay

## 2011-02-12 ENCOUNTER — Encounter (HOSPITAL_COMMUNITY)
Admission: RE | Admit: 2011-02-12 | Discharge: 2011-02-12 | Disposition: A | Discharge status: Home / Self Care | Payer: Self-pay | Source: Ambulatory Visit | Attending: Cardiovascular Disease | Admitting: Cardiovascular Disease

## 2011-02-13 ENCOUNTER — Encounter (HOSPITAL_COMMUNITY): Payer: Self-pay

## 2011-02-14 ENCOUNTER — Encounter (HOSPITAL_COMMUNITY): Payer: Self-pay

## 2011-02-19 ENCOUNTER — Encounter (HOSPITAL_COMMUNITY)
Admission: RE | Admit: 2011-02-19 | Discharge: 2011-02-19 | Disposition: A | Discharge status: Home / Self Care | Payer: Self-pay | Source: Ambulatory Visit | Attending: Cardiovascular Disease | Admitting: Cardiovascular Disease

## 2011-02-19 DIAGNOSIS — Z8249 Family history of ischemic heart disease and other diseases of the circulatory system: Secondary | ICD-10-CM | POA: Insufficient documentation

## 2011-02-19 DIAGNOSIS — I2510 Atherosclerotic heart disease of native coronary artery without angina pectoris: Secondary | ICD-10-CM | POA: Insufficient documentation

## 2011-02-19 DIAGNOSIS — Z5189 Encounter for other specified aftercare: Secondary | ICD-10-CM | POA: Insufficient documentation

## 2011-02-19 DIAGNOSIS — I10 Essential (primary) hypertension: Secondary | ICD-10-CM | POA: Insufficient documentation

## 2011-02-19 DIAGNOSIS — E7800 Pure hypercholesterolemia, unspecified: Secondary | ICD-10-CM | POA: Insufficient documentation

## 2011-02-19 DIAGNOSIS — Z951 Presence of aortocoronary bypass graft: Secondary | ICD-10-CM | POA: Insufficient documentation

## 2011-02-19 DIAGNOSIS — E669 Obesity, unspecified: Secondary | ICD-10-CM | POA: Insufficient documentation

## 2011-02-20 ENCOUNTER — Encounter (HOSPITAL_COMMUNITY)
Admission: RE | Admit: 2011-02-20 | Discharge: 2011-02-20 | Disposition: A | Discharge status: Home / Self Care | Payer: Self-pay | Source: Ambulatory Visit | Attending: Cardiovascular Disease | Admitting: Cardiovascular Disease

## 2011-02-21 ENCOUNTER — Encounter (HOSPITAL_COMMUNITY)
Admission: RE | Admit: 2011-02-21 | Discharge: 2011-02-21 | Disposition: A | Discharge status: Home / Self Care | Payer: Self-pay | Source: Ambulatory Visit | Attending: Cardiovascular Disease | Admitting: Cardiovascular Disease

## 2011-02-26 ENCOUNTER — Encounter (HOSPITAL_COMMUNITY)
Admission: RE | Admit: 2011-02-26 | Discharge: 2011-02-26 | Disposition: A | Discharge status: Home / Self Care | Payer: Self-pay | Source: Ambulatory Visit | Attending: Cardiovascular Disease | Admitting: Cardiovascular Disease

## 2011-02-27 ENCOUNTER — Encounter (HOSPITAL_COMMUNITY)
Admission: RE | Admit: 2011-02-27 | Discharge: 2011-02-27 | Disposition: A | Discharge status: Home / Self Care | Payer: Self-pay | Source: Ambulatory Visit | Attending: Cardiovascular Disease | Admitting: Cardiovascular Disease

## 2011-02-28 ENCOUNTER — Encounter (HOSPITAL_COMMUNITY)
Admission: RE | Admit: 2011-02-28 | Discharge: 2011-02-28 | Disposition: A | Discharge status: Home / Self Care | Payer: Self-pay | Source: Ambulatory Visit | Attending: Cardiovascular Disease | Admitting: Cardiovascular Disease

## 2011-03-05 ENCOUNTER — Encounter (HOSPITAL_COMMUNITY)
Admission: RE | Admit: 2011-03-05 | Discharge: 2011-03-05 | Disposition: A | Discharge status: Home / Self Care | Payer: Self-pay | Source: Ambulatory Visit | Attending: Cardiovascular Disease | Admitting: Cardiovascular Disease

## 2011-03-06 ENCOUNTER — Encounter (HOSPITAL_COMMUNITY)
Admission: RE | Admit: 2011-03-06 | Discharge: 2011-03-06 | Disposition: A | Discharge status: Home / Self Care | Payer: Self-pay | Source: Ambulatory Visit | Attending: Cardiovascular Disease | Admitting: Cardiovascular Disease

## 2011-03-07 ENCOUNTER — Encounter (HOSPITAL_COMMUNITY)
Admission: RE | Admit: 2011-03-07 | Discharge: 2011-03-07 | Disposition: A | Discharge status: Home / Self Care | Payer: Self-pay | Source: Ambulatory Visit | Attending: Cardiovascular Disease | Admitting: Cardiovascular Disease

## 2011-03-12 ENCOUNTER — Encounter (HOSPITAL_COMMUNITY): Payer: Self-pay

## 2011-03-13 ENCOUNTER — Encounter (HOSPITAL_COMMUNITY): Payer: Self-pay

## 2011-03-14 ENCOUNTER — Encounter (HOSPITAL_COMMUNITY): Payer: Self-pay

## 2011-03-19 ENCOUNTER — Encounter (HOSPITAL_COMMUNITY): Payer: Self-pay

## 2011-03-20 ENCOUNTER — Encounter (HOSPITAL_COMMUNITY): Payer: Self-pay

## 2011-03-21 ENCOUNTER — Encounter (HOSPITAL_COMMUNITY)
Admission: RE | Admit: 2011-03-21 | Discharge: 2011-03-21 | Disposition: A | Discharge status: Home / Self Care | Payer: Self-pay | Source: Ambulatory Visit | Attending: Cardiovascular Disease | Admitting: Cardiovascular Disease

## 2011-03-21 DIAGNOSIS — E669 Obesity, unspecified: Secondary | ICD-10-CM | POA: Insufficient documentation

## 2011-03-21 DIAGNOSIS — E7800 Pure hypercholesterolemia, unspecified: Secondary | ICD-10-CM | POA: Insufficient documentation

## 2011-03-21 DIAGNOSIS — I10 Essential (primary) hypertension: Secondary | ICD-10-CM | POA: Insufficient documentation

## 2011-03-21 DIAGNOSIS — Z951 Presence of aortocoronary bypass graft: Secondary | ICD-10-CM | POA: Insufficient documentation

## 2011-03-21 DIAGNOSIS — I2510 Atherosclerotic heart disease of native coronary artery without angina pectoris: Secondary | ICD-10-CM | POA: Insufficient documentation

## 2011-03-21 DIAGNOSIS — Z8249 Family history of ischemic heart disease and other diseases of the circulatory system: Secondary | ICD-10-CM | POA: Insufficient documentation

## 2011-03-21 DIAGNOSIS — Z5189 Encounter for other specified aftercare: Secondary | ICD-10-CM | POA: Insufficient documentation

## 2011-03-22 ENCOUNTER — Encounter (HOSPITAL_COMMUNITY)
Admission: RE | Admit: 2011-03-22 | Discharge: 2011-03-22 | Disposition: A | Discharge status: Home / Self Care | Payer: Self-pay | Source: Ambulatory Visit | Attending: Cardiovascular Disease | Admitting: Cardiovascular Disease

## 2011-03-26 ENCOUNTER — Encounter (HOSPITAL_COMMUNITY)
Admission: RE | Admit: 2011-03-26 | Discharge: 2011-03-26 | Disposition: A | Discharge status: Home / Self Care | Payer: Self-pay | Source: Ambulatory Visit | Attending: Cardiovascular Disease | Admitting: Cardiovascular Disease

## 2011-03-27 ENCOUNTER — Encounter (HOSPITAL_COMMUNITY)
Admission: RE | Admit: 2011-03-27 | Discharge: 2011-03-27 | Disposition: A | Discharge status: Home / Self Care | Payer: Self-pay | Source: Ambulatory Visit | Attending: Cardiovascular Disease | Admitting: Cardiovascular Disease

## 2011-03-28 ENCOUNTER — Encounter (HOSPITAL_COMMUNITY)
Admission: RE | Admit: 2011-03-28 | Discharge: 2011-03-28 | Disposition: A | Discharge status: Home / Self Care | Payer: Self-pay | Source: Ambulatory Visit | Attending: Cardiovascular Disease | Admitting: Cardiovascular Disease

## 2011-04-02 ENCOUNTER — Encounter (HOSPITAL_COMMUNITY): Payer: Self-pay

## 2011-04-03 ENCOUNTER — Encounter (HOSPITAL_COMMUNITY)
Admission: RE | Admit: 2011-04-03 | Discharge: 2011-04-03 | Disposition: A | Discharge status: Home / Self Care | Payer: Self-pay | Source: Ambulatory Visit | Attending: Cardiovascular Disease | Admitting: Cardiovascular Disease

## 2011-04-04 ENCOUNTER — Encounter (HOSPITAL_COMMUNITY): Payer: Self-pay

## 2011-04-05 ENCOUNTER — Encounter (HOSPITAL_COMMUNITY): Payer: Self-pay

## 2011-04-08 ENCOUNTER — Encounter (HOSPITAL_COMMUNITY): Payer: Self-pay

## 2011-04-09 ENCOUNTER — Encounter (HOSPITAL_COMMUNITY)
Admission: RE | Admit: 2011-04-09 | Discharge: 2011-04-09 | Disposition: A | Discharge status: Home / Self Care | Payer: Self-pay | Source: Ambulatory Visit | Attending: Cardiovascular Disease | Admitting: Cardiovascular Disease

## 2011-04-10 ENCOUNTER — Encounter (HOSPITAL_COMMUNITY)
Admission: RE | Admit: 2011-04-10 | Discharge: 2011-04-10 | Disposition: A | Discharge status: Home / Self Care | Payer: Self-pay | Source: Ambulatory Visit | Attending: Cardiovascular Disease | Admitting: Cardiovascular Disease

## 2011-04-11 ENCOUNTER — Encounter (HOSPITAL_COMMUNITY)
Admission: RE | Admit: 2011-04-11 | Discharge: 2011-04-11 | Disposition: A | Discharge status: Home / Self Care | Payer: Self-pay | Source: Ambulatory Visit | Attending: Cardiovascular Disease | Admitting: Cardiovascular Disease

## 2011-04-12 ENCOUNTER — Encounter (HOSPITAL_COMMUNITY): Payer: Self-pay

## 2011-04-15 ENCOUNTER — Encounter (HOSPITAL_COMMUNITY): Payer: Self-pay

## 2011-04-16 ENCOUNTER — Encounter (HOSPITAL_COMMUNITY)
Admission: RE | Admit: 2011-04-16 | Discharge: 2011-04-16 | Disposition: A | Discharge status: Home / Self Care | Payer: Self-pay | Source: Ambulatory Visit | Attending: Cardiovascular Disease | Admitting: Cardiovascular Disease

## 2011-04-17 ENCOUNTER — Encounter (HOSPITAL_COMMUNITY)
Admission: RE | Admit: 2011-04-17 | Discharge: 2011-04-17 | Disposition: A | Discharge status: Home / Self Care | Payer: Self-pay | Source: Ambulatory Visit | Attending: Cardiovascular Disease | Admitting: Cardiovascular Disease

## 2011-04-18 ENCOUNTER — Encounter (HOSPITAL_COMMUNITY)
Admission: RE | Admit: 2011-04-18 | Discharge: 2011-04-18 | Disposition: A | Discharge status: Home / Self Care | Payer: Self-pay | Source: Ambulatory Visit | Attending: Cardiovascular Disease | Admitting: Cardiovascular Disease

## 2011-04-19 ENCOUNTER — Encounter (HOSPITAL_COMMUNITY): Payer: Self-pay

## 2011-04-22 ENCOUNTER — Encounter (HOSPITAL_COMMUNITY): Payer: Self-pay

## 2011-04-23 ENCOUNTER — Encounter (HOSPITAL_COMMUNITY)
Admission: RE | Admit: 2011-04-23 | Discharge: 2011-04-23 | Disposition: A | Discharge status: Home / Self Care | Payer: Self-pay | Source: Ambulatory Visit | Attending: Cardiovascular Disease | Admitting: Cardiovascular Disease

## 2011-04-23 DIAGNOSIS — Z951 Presence of aortocoronary bypass graft: Secondary | ICD-10-CM | POA: Insufficient documentation

## 2011-04-23 DIAGNOSIS — Z5189 Encounter for other specified aftercare: Secondary | ICD-10-CM | POA: Insufficient documentation

## 2011-04-23 DIAGNOSIS — E7800 Pure hypercholesterolemia, unspecified: Secondary | ICD-10-CM | POA: Insufficient documentation

## 2011-04-23 DIAGNOSIS — E669 Obesity, unspecified: Secondary | ICD-10-CM | POA: Insufficient documentation

## 2011-04-23 DIAGNOSIS — Z8249 Family history of ischemic heart disease and other diseases of the circulatory system: Secondary | ICD-10-CM | POA: Insufficient documentation

## 2011-04-23 DIAGNOSIS — I2510 Atherosclerotic heart disease of native coronary artery without angina pectoris: Secondary | ICD-10-CM | POA: Insufficient documentation

## 2011-04-23 DIAGNOSIS — I10 Essential (primary) hypertension: Secondary | ICD-10-CM | POA: Insufficient documentation

## 2011-04-24 ENCOUNTER — Encounter (HOSPITAL_COMMUNITY)
Admission: RE | Admit: 2011-04-24 | Discharge: 2011-04-24 | Disposition: A | Discharge status: Home / Self Care | Payer: Self-pay | Source: Ambulatory Visit | Attending: Cardiovascular Disease | Admitting: Cardiovascular Disease

## 2011-04-25 ENCOUNTER — Encounter (HOSPITAL_COMMUNITY)
Admission: RE | Admit: 2011-04-25 | Discharge: 2011-04-25 | Disposition: A | Discharge status: Home / Self Care | Payer: Self-pay | Source: Ambulatory Visit | Attending: Cardiovascular Disease | Admitting: Cardiovascular Disease

## 2011-04-26 ENCOUNTER — Encounter (HOSPITAL_COMMUNITY): Payer: Self-pay

## 2011-04-29 ENCOUNTER — Encounter (HOSPITAL_COMMUNITY): Payer: Self-pay

## 2011-04-30 ENCOUNTER — Encounter (HOSPITAL_COMMUNITY)
Admission: RE | Admit: 2011-04-30 | Discharge: 2011-04-30 | Disposition: A | Discharge status: Home / Self Care | Payer: Self-pay | Source: Ambulatory Visit | Attending: Cardiovascular Disease | Admitting: Cardiovascular Disease

## 2011-05-01 ENCOUNTER — Encounter (HOSPITAL_COMMUNITY): Payer: Self-pay

## 2011-05-02 ENCOUNTER — Encounter (HOSPITAL_COMMUNITY)
Admission: RE | Admit: 2011-05-02 | Discharge: 2011-05-02 | Disposition: A | Discharge status: Home / Self Care | Payer: Self-pay | Source: Ambulatory Visit | Attending: Cardiovascular Disease | Admitting: Cardiovascular Disease

## 2011-05-03 ENCOUNTER — Encounter (HOSPITAL_COMMUNITY): Payer: Self-pay

## 2011-05-06 ENCOUNTER — Encounter (HOSPITAL_COMMUNITY): Payer: Self-pay

## 2011-05-07 ENCOUNTER — Encounter (HOSPITAL_COMMUNITY)
Admission: RE | Admit: 2011-05-07 | Discharge: 2011-05-07 | Disposition: A | Discharge status: Home / Self Care | Payer: Self-pay | Source: Ambulatory Visit | Attending: Cardiovascular Disease | Admitting: Cardiovascular Disease

## 2011-05-08 ENCOUNTER — Encounter (HOSPITAL_COMMUNITY)
Admission: RE | Admit: 2011-05-08 | Discharge: 2011-05-08 | Disposition: A | Discharge status: Home / Self Care | Payer: Self-pay | Source: Ambulatory Visit | Attending: Cardiovascular Disease | Admitting: Cardiovascular Disease

## 2011-05-09 ENCOUNTER — Encounter (HOSPITAL_COMMUNITY)
Admission: RE | Admit: 2011-05-09 | Discharge: 2011-05-09 | Disposition: A | Discharge status: Home / Self Care | Payer: Self-pay | Source: Ambulatory Visit | Attending: Cardiovascular Disease | Admitting: Cardiovascular Disease

## 2011-05-10 ENCOUNTER — Encounter (HOSPITAL_COMMUNITY): Payer: Self-pay

## 2011-05-13 ENCOUNTER — Encounter (HOSPITAL_COMMUNITY): Payer: Self-pay

## 2011-05-14 ENCOUNTER — Other Ambulatory Visit: Payer: Self-pay | Admitting: *Deleted

## 2011-05-14 ENCOUNTER — Encounter (HOSPITAL_COMMUNITY)
Admission: RE | Admit: 2011-05-14 | Discharge: 2011-05-14 | Disposition: A | Discharge status: Home / Self Care | Payer: Self-pay | Source: Ambulatory Visit | Attending: Cardiovascular Disease | Admitting: Cardiovascular Disease

## 2011-05-14 MED ORDER — NEBIVOLOL HCL 5 MG PO TABS: 5.0000 mg | ORAL_TABLET | Freq: Every day | ORAL | Status: DC

## 2011-05-15 ENCOUNTER — Encounter (HOSPITAL_COMMUNITY): Payer: Self-pay

## 2011-05-16 ENCOUNTER — Encounter (HOSPITAL_COMMUNITY)
Admission: RE | Admit: 2011-05-16 | Discharge: 2011-05-16 | Disposition: A | Discharge status: Home / Self Care | Payer: Self-pay | Source: Ambulatory Visit | Attending: Cardiovascular Disease | Admitting: Cardiovascular Disease

## 2011-05-17 ENCOUNTER — Encounter (HOSPITAL_COMMUNITY): Payer: Self-pay

## 2011-05-20 ENCOUNTER — Encounter (HOSPITAL_COMMUNITY): Payer: Self-pay

## 2011-05-21 ENCOUNTER — Encounter (HOSPITAL_COMMUNITY)
Admission: RE | Admit: 2011-05-21 | Discharge: 2011-05-21 | Disposition: A | Discharge status: Home / Self Care | Payer: Self-pay | Source: Ambulatory Visit | Attending: Cardiovascular Disease | Admitting: Cardiovascular Disease

## 2011-05-21 DIAGNOSIS — E669 Obesity, unspecified: Secondary | ICD-10-CM | POA: Insufficient documentation

## 2011-05-21 DIAGNOSIS — E7800 Pure hypercholesterolemia, unspecified: Secondary | ICD-10-CM | POA: Insufficient documentation

## 2011-05-21 DIAGNOSIS — Z5189 Encounter for other specified aftercare: Secondary | ICD-10-CM | POA: Insufficient documentation

## 2011-05-21 DIAGNOSIS — I10 Essential (primary) hypertension: Secondary | ICD-10-CM | POA: Insufficient documentation

## 2011-05-21 DIAGNOSIS — Z951 Presence of aortocoronary bypass graft: Secondary | ICD-10-CM | POA: Insufficient documentation

## 2011-05-21 DIAGNOSIS — Z8249 Family history of ischemic heart disease and other diseases of the circulatory system: Secondary | ICD-10-CM | POA: Insufficient documentation

## 2011-05-21 DIAGNOSIS — I2510 Atherosclerotic heart disease of native coronary artery without angina pectoris: Secondary | ICD-10-CM | POA: Insufficient documentation

## 2011-05-22 ENCOUNTER — Encounter (HOSPITAL_COMMUNITY): Payer: Self-pay

## 2011-05-23 ENCOUNTER — Encounter (HOSPITAL_COMMUNITY)
Admission: RE | Admit: 2011-05-23 | Discharge: 2011-05-23 | Disposition: A | Discharge status: Home / Self Care | Payer: Self-pay | Source: Ambulatory Visit | Attending: Cardiovascular Disease | Admitting: Cardiovascular Disease

## 2011-05-24 ENCOUNTER — Encounter (HOSPITAL_COMMUNITY): Payer: Self-pay

## 2011-05-27 ENCOUNTER — Encounter (HOSPITAL_COMMUNITY): Payer: Self-pay

## 2011-05-28 ENCOUNTER — Encounter (HOSPITAL_COMMUNITY): Payer: Self-pay

## 2011-05-29 ENCOUNTER — Encounter (HOSPITAL_COMMUNITY)
Admission: RE | Admit: 2011-05-29 | Discharge: 2011-05-29 | Disposition: A | Discharge status: Home / Self Care | Payer: Self-pay | Source: Ambulatory Visit | Attending: Cardiovascular Disease | Admitting: Cardiovascular Disease

## 2011-05-30 ENCOUNTER — Encounter (HOSPITAL_COMMUNITY)
Admission: RE | Admit: 2011-05-30 | Discharge: 2011-05-30 | Disposition: A | Discharge status: Home / Self Care | Payer: Self-pay | Source: Ambulatory Visit | Attending: Cardiovascular Disease | Admitting: Cardiovascular Disease

## 2011-05-31 ENCOUNTER — Encounter (HOSPITAL_COMMUNITY): Payer: Self-pay

## 2011-06-03 ENCOUNTER — Encounter (HOSPITAL_COMMUNITY): Payer: Self-pay

## 2011-06-04 ENCOUNTER — Encounter (HOSPITAL_COMMUNITY)
Admission: RE | Admit: 2011-06-04 | Discharge: 2011-06-04 | Disposition: A | Discharge status: Home / Self Care | Payer: Self-pay | Source: Ambulatory Visit | Attending: Cardiovascular Disease | Admitting: Cardiovascular Disease

## 2011-06-05 ENCOUNTER — Encounter (HOSPITAL_COMMUNITY)
Admission: RE | Admit: 2011-06-05 | Discharge: 2011-06-05 | Disposition: A | Discharge status: Home / Self Care | Payer: Self-pay | Source: Ambulatory Visit | Attending: Cardiovascular Disease | Admitting: Cardiovascular Disease

## 2011-06-06 ENCOUNTER — Encounter (HOSPITAL_COMMUNITY)
Admission: RE | Admit: 2011-06-06 | Discharge: 2011-06-06 | Disposition: A | Discharge status: Home / Self Care | Payer: Self-pay | Source: Ambulatory Visit | Attending: Cardiovascular Disease | Admitting: Cardiovascular Disease

## 2011-06-07 ENCOUNTER — Encounter (HOSPITAL_COMMUNITY): Payer: Self-pay

## 2011-06-10 ENCOUNTER — Encounter (HOSPITAL_COMMUNITY): Payer: Self-pay

## 2011-06-11 ENCOUNTER — Encounter (HOSPITAL_COMMUNITY)
Admission: RE | Admit: 2011-06-11 | Discharge: 2011-06-11 | Disposition: A | Discharge status: Home / Self Care | Payer: Self-pay | Source: Ambulatory Visit | Attending: Cardiovascular Disease | Admitting: Cardiovascular Disease

## 2011-06-12 ENCOUNTER — Encounter (HOSPITAL_COMMUNITY): Payer: Self-pay

## 2011-06-13 ENCOUNTER — Encounter (HOSPITAL_COMMUNITY)
Admission: RE | Admit: 2011-06-13 | Discharge: 2011-06-13 | Disposition: A | Discharge status: Home / Self Care | Payer: Self-pay | Source: Ambulatory Visit | Attending: Cardiovascular Disease | Admitting: Cardiovascular Disease

## 2011-06-14 ENCOUNTER — Encounter (HOSPITAL_COMMUNITY): Payer: Self-pay

## 2011-06-17 ENCOUNTER — Encounter (HOSPITAL_COMMUNITY): Payer: Self-pay

## 2011-06-18 ENCOUNTER — Encounter (HOSPITAL_COMMUNITY)
Admission: RE | Admit: 2011-06-18 | Discharge: 2011-06-18 | Disposition: A | Discharge status: Home / Self Care | Payer: Self-pay | Source: Ambulatory Visit | Attending: Cardiovascular Disease | Admitting: Cardiovascular Disease

## 2011-06-18 DIAGNOSIS — Z8249 Family history of ischemic heart disease and other diseases of the circulatory system: Secondary | ICD-10-CM | POA: Insufficient documentation

## 2011-06-18 DIAGNOSIS — I2510 Atherosclerotic heart disease of native coronary artery without angina pectoris: Secondary | ICD-10-CM | POA: Insufficient documentation

## 2011-06-18 DIAGNOSIS — E669 Obesity, unspecified: Secondary | ICD-10-CM | POA: Insufficient documentation

## 2011-06-18 DIAGNOSIS — Z5189 Encounter for other specified aftercare: Secondary | ICD-10-CM | POA: Insufficient documentation

## 2011-06-18 DIAGNOSIS — I10 Essential (primary) hypertension: Secondary | ICD-10-CM | POA: Insufficient documentation

## 2011-06-18 DIAGNOSIS — E7800 Pure hypercholesterolemia, unspecified: Secondary | ICD-10-CM | POA: Insufficient documentation

## 2011-06-18 DIAGNOSIS — Z951 Presence of aortocoronary bypass graft: Secondary | ICD-10-CM | POA: Insufficient documentation

## 2011-06-19 ENCOUNTER — Encounter (HOSPITAL_COMMUNITY): Payer: Self-pay

## 2011-06-20 ENCOUNTER — Encounter (HOSPITAL_COMMUNITY)
Admission: RE | Admit: 2011-06-20 | Discharge: 2011-06-20 | Disposition: A | Discharge status: Home / Self Care | Payer: Self-pay | Source: Ambulatory Visit | Attending: Cardiovascular Disease | Admitting: Cardiovascular Disease

## 2011-06-21 ENCOUNTER — Encounter (HOSPITAL_COMMUNITY): Payer: Self-pay

## 2011-06-24 ENCOUNTER — Encounter (HOSPITAL_COMMUNITY): Payer: Self-pay

## 2011-06-25 ENCOUNTER — Encounter (HOSPITAL_COMMUNITY): Payer: Self-pay

## 2011-06-26 ENCOUNTER — Encounter (HOSPITAL_COMMUNITY)
Admission: RE | Admit: 2011-06-26 | Discharge: 2011-06-26 | Disposition: A | Discharge status: Home / Self Care | Payer: Self-pay | Source: Ambulatory Visit | Attending: Cardiovascular Disease | Admitting: Cardiovascular Disease

## 2011-06-27 ENCOUNTER — Encounter (HOSPITAL_COMMUNITY)
Admission: RE | Admit: 2011-06-27 | Discharge: 2011-06-27 | Disposition: A | Discharge status: Home / Self Care | Payer: Self-pay | Source: Ambulatory Visit | Attending: Cardiovascular Disease | Admitting: Cardiovascular Disease

## 2011-06-28 ENCOUNTER — Encounter (HOSPITAL_COMMUNITY): Payer: Self-pay

## 2011-07-01 ENCOUNTER — Encounter (HOSPITAL_COMMUNITY): Payer: Self-pay

## 2011-07-02 ENCOUNTER — Encounter (HOSPITAL_COMMUNITY)
Admission: RE | Admit: 2011-07-02 | Discharge: 2011-07-02 | Disposition: A | Discharge status: Home / Self Care | Payer: Self-pay | Source: Ambulatory Visit | Attending: Cardiovascular Disease | Admitting: Cardiovascular Disease

## 2011-07-03 ENCOUNTER — Encounter (HOSPITAL_COMMUNITY)
Admission: RE | Admit: 2011-07-03 | Discharge: 2011-07-03 | Disposition: A | Discharge status: Home / Self Care | Payer: Self-pay | Source: Ambulatory Visit | Attending: Cardiovascular Disease | Admitting: Cardiovascular Disease

## 2011-07-04 ENCOUNTER — Encounter (HOSPITAL_COMMUNITY)
Admission: RE | Admit: 2011-07-04 | Discharge: 2011-07-04 | Disposition: A | Discharge status: Home / Self Care | Payer: Self-pay | Source: Ambulatory Visit | Attending: Cardiovascular Disease | Admitting: Cardiovascular Disease

## 2011-07-04 ENCOUNTER — Ambulatory Visit (INDEPENDENT_AMBULATORY_CARE_PROVIDER_SITE_OTHER): Payer: Medicare Other | Admitting: *Deleted

## 2011-07-04 DIAGNOSIS — Z125 Encounter for screening for malignant neoplasm of prostate: Secondary | ICD-10-CM

## 2011-07-04 DIAGNOSIS — I119 Hypertensive heart disease without heart failure: Secondary | ICD-10-CM

## 2011-07-04 LAB — BASIC METABOLIC PANEL
BUN: 15 mg/dL (ref 6–23)
CO2: 30 mEq/L (ref 19–32)
Chloride: 102 mEq/L (ref 96–112)
Creatinine, Ser: 0.9 mg/dL (ref 0.4–1.5)
Glucose, Bld: 113 mg/dL — ABNORMAL HIGH (ref 70–99)
Potassium: 4.6 mEq/L (ref 3.5–5.1)

## 2011-07-04 LAB — LIPID PANEL
Cholesterol: 113 mg/dL (ref 0–200)
LDL Cholesterol: 59 mg/dL (ref 0–99)
Total CHOL/HDL Ratio: 3
VLDL: 12.6 mg/dL (ref 0.0–40.0)

## 2011-07-04 LAB — HEPATIC FUNCTION PANEL
Alkaline Phosphatase: 46 U/L (ref 39–117)
Bilirubin, Direct: 0.2 mg/dL (ref 0.0–0.3)
Total Bilirubin: 2.6 mg/dL — ABNORMAL HIGH (ref 0.3–1.2)
Total Protein: 6.9 g/dL (ref 6.0–8.3)

## 2011-07-05 ENCOUNTER — Telehealth: Payer: Self-pay | Admitting: Cardiovascular Disease

## 2011-07-05 ENCOUNTER — Encounter (HOSPITAL_COMMUNITY): Payer: Self-pay

## 2011-07-05 NOTE — Telephone Encounter (Signed)
Pt aware of all lab results. He would like a copy of them when he comes in next week. Mylo Red RN

## 2011-07-05 NOTE — Telephone Encounter (Signed)
Fu call °Patient returning your call about test results °

## 2011-07-08 ENCOUNTER — Encounter (HOSPITAL_COMMUNITY): Payer: Self-pay

## 2011-07-08 NOTE — Telephone Encounter (Signed)
noted 

## 2011-07-09 ENCOUNTER — Encounter (HOSPITAL_COMMUNITY)
Admission: RE | Admit: 2011-07-09 | Discharge: 2011-07-09 | Disposition: A | Discharge status: Home / Self Care | Payer: Medicare Other | Source: Ambulatory Visit | Attending: Cardiovascular Disease | Admitting: Cardiovascular Disease

## 2011-07-10 ENCOUNTER — Encounter (HOSPITAL_COMMUNITY)
Admission: RE | Admit: 2011-07-10 | Discharge: 2011-07-10 | Disposition: A | Discharge status: Home / Self Care | Payer: Medicare Other | Source: Ambulatory Visit | Attending: Cardiovascular Disease | Admitting: Cardiovascular Disease

## 2011-07-11 ENCOUNTER — Encounter (HOSPITAL_COMMUNITY)
Admission: RE | Admit: 2011-07-11 | Discharge: 2011-07-11 | Disposition: A | Discharge status: Home / Self Care | Payer: Medicare Other | Source: Ambulatory Visit | Attending: Cardiovascular Disease | Admitting: Cardiovascular Disease

## 2011-07-12 ENCOUNTER — Encounter (HOSPITAL_COMMUNITY): Payer: Self-pay

## 2011-07-12 ENCOUNTER — Ambulatory Visit (INDEPENDENT_AMBULATORY_CARE_PROVIDER_SITE_OTHER): Payer: Medicare Other | Admitting: Cardiovascular Disease

## 2011-07-12 ENCOUNTER — Encounter: Payer: Self-pay | Admitting: Cardiovascular Disease

## 2011-07-12 VITALS — BP 140/66 | HR 62 | Ht 69.0 in | Wt 187.2 lb

## 2011-07-12 DIAGNOSIS — F419 Anxiety disorder, unspecified: Secondary | ICD-10-CM

## 2011-07-12 DIAGNOSIS — E785 Hyperlipidemia, unspecified: Secondary | ICD-10-CM

## 2011-07-12 DIAGNOSIS — I2510 Atherosclerotic heart disease of native coronary artery without angina pectoris: Secondary | ICD-10-CM

## 2011-07-12 DIAGNOSIS — F411 Generalized anxiety disorder: Secondary | ICD-10-CM

## 2011-07-12 NOTE — Assessment & Plan Note (Signed)
William Hernandez is doing well. He's not having any episodes of chest pain or shortness of breath.

## 2011-07-12 NOTE — Assessment & Plan Note (Signed)
His lipid levels are very well controlled.

## 2011-07-12 NOTE — Assessment & Plan Note (Signed)
Continue to see his counselor.

## 2011-07-12 NOTE — Patient Instructions (Signed)
Your physician wants you to follow-up in:  6 months. You will receive a reminder letter in the mail two months in advance. If you don't receive a letter, please call our office to schedule the follow-up appointment.   

## 2011-07-12 NOTE — Progress Notes (Addendum)
William Hernandez Date of Birth  [birth year] [facility] [address]    [address] [place], [place]  [address] [phone]  Fax  [phone]  Problems 1. CAD 2. Dyslipidemia 3. Atrial fibrillation 4. Depression  History of Present Illness:  William Hernandez is a 66 year old gentleman with a history of coronary disease-status post coronary artery bypass grafting.  He's done very well from a cardiac standpoint. He is participating in cardiac rehabilitation and has not had any episodes of chest pain or shortness breath.  He has some episodes of chest pressure that typically occur if he is sitting balance relaxing.  He continues to have lots of problems with anxiety.  He has done well.  His lipids are well controlled.    He is participating in cardiac rehab and is doing well.  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 He doesn't have any episodes of chest pain or shortness breath when he is active.  Current Outpatient Prescriptions on File Prior to Visit  Medication Sig Dispense Refill  . aspirin 325 MG EC tablet Take 325 mg by mouth daily.        Marland Kitchen atorvastatin (LIPITOR) 20 MG tablet Take one tablet by mouth once daily.   90 tablet  3  . fish oil-omega-3 fatty acids 1000 MG capsule Take 1 g by mouth daily.       . Flaxseed, Linseed, (FLAX SEED OIL PO) Take by mouth daily.        . irbesartan (AVAPRO) 150 MG tablet Take 0.5 tablets (75 mg total) by mouth at bedtime.  90 tablet  3  . Multiple Vitamin (MULTIVITAMIN PO) Take by mouth daily.        . nebivolol (BYSTOLIC) 5 MG tablet Take 1 tablet (5 mg total) by mouth daily.  30 tablet  6  . ranitidine (ZANTAC) 150 MG capsule Take 150 mg by mouth every evening.        . TRAZODONE HCL PO Take 75 mg by mouth at bedtime as needed.         Allergies  Allergen Reactions  . Lisinopril   . Penicillins     Past Medical History  Diagnosis Date  . Hyperlipidemia   . Arrhythmia     afib  . Depression   .  Coronary artery disease     2003  . S/P hernia surgery     Past Surgical History  Procedure Date  . Hernia repair   . Cholecystectomy   . Cardiac catheterization     11/2001  . Coronary artery bypass graft 2000    History  Smoking status  . Never Smoker   Smokeless tobacco  . Not on file    History  Alcohol Use No    Family History  Problem Relation Age of Onset  . Heart disease Father     Reviw of Systems:  Reviewed in the HPI.  All other systems are negative.  Physical Exam: BP 140/66  Pulse 62  Ht 5\' 9"  (1.753 m)  Wt 187 lb 3.2 oz (84.913 kg)  BMI 27.64 kg/m2 The patient is alert and oriented x 3.  The mood and affect are normal.   Skin: warm and dry.  Color is normal.    HEENT:   normal  Lungs: clear   Heart: RR, no murmurs    Abdomen: soft, good BS  Extremities:  No edema  Neuro:  Non focal  ECG: 07/12/2011-normal sinus rhythm.  Incomplete) slide. His voltage criteria for left ventricular hypertrophy.  Assessment / Plan:

## 2011-07-15 ENCOUNTER — Encounter (HOSPITAL_COMMUNITY): Payer: Self-pay

## 2011-07-16 ENCOUNTER — Encounter (HOSPITAL_COMMUNITY): Payer: Medicare Other

## 2011-07-17 ENCOUNTER — Encounter (HOSPITAL_COMMUNITY)
Admission: RE | Admit: 2011-07-17 | Discharge: 2011-07-17 | Disposition: A | Discharge status: Home / Self Care | Payer: Self-pay | Source: Ambulatory Visit | Attending: Cardiovascular Disease | Admitting: Cardiovascular Disease

## 2011-07-17 DIAGNOSIS — E669 Obesity, unspecified: Secondary | ICD-10-CM | POA: Insufficient documentation

## 2011-07-17 DIAGNOSIS — Z5189 Encounter for other specified aftercare: Secondary | ICD-10-CM | POA: Insufficient documentation

## 2011-07-17 DIAGNOSIS — I10 Essential (primary) hypertension: Secondary | ICD-10-CM | POA: Insufficient documentation

## 2011-07-17 DIAGNOSIS — Z8249 Family history of ischemic heart disease and other diseases of the circulatory system: Secondary | ICD-10-CM | POA: Insufficient documentation

## 2011-07-17 DIAGNOSIS — E7800 Pure hypercholesterolemia, unspecified: Secondary | ICD-10-CM | POA: Insufficient documentation

## 2011-07-17 DIAGNOSIS — I2510 Atherosclerotic heart disease of native coronary artery without angina pectoris: Secondary | ICD-10-CM | POA: Insufficient documentation

## 2011-07-17 DIAGNOSIS — Z951 Presence of aortocoronary bypass graft: Secondary | ICD-10-CM | POA: Insufficient documentation

## 2011-07-18 ENCOUNTER — Encounter (HOSPITAL_COMMUNITY)
Admission: RE | Admit: 2011-07-18 | Discharge: 2011-07-18 | Disposition: A | Discharge status: Home / Self Care | Payer: Self-pay | Source: Ambulatory Visit

## 2011-07-19 ENCOUNTER — Encounter (HOSPITAL_COMMUNITY): Payer: Self-pay

## 2011-07-22 ENCOUNTER — Encounter (HOSPITAL_COMMUNITY): Payer: Self-pay

## 2011-07-23 ENCOUNTER — Encounter (HOSPITAL_COMMUNITY)
Admission: RE | Admit: 2011-07-23 | Discharge: 2011-07-23 | Disposition: A | Discharge status: Home / Self Care | Payer: Self-pay | Source: Ambulatory Visit | Attending: Cardiovascular Disease | Admitting: Cardiovascular Disease

## 2011-07-24 ENCOUNTER — Encounter (HOSPITAL_COMMUNITY)
Admission: RE | Admit: 2011-07-24 | Discharge: 2011-07-24 | Disposition: A | Discharge status: Home / Self Care | Payer: Self-pay | Source: Ambulatory Visit | Attending: Cardiovascular Disease | Admitting: Cardiovascular Disease

## 2011-07-25 ENCOUNTER — Encounter (HOSPITAL_COMMUNITY): Payer: Self-pay

## 2011-07-26 ENCOUNTER — Encounter (HOSPITAL_COMMUNITY): Payer: Self-pay

## 2011-07-29 ENCOUNTER — Encounter (HOSPITAL_COMMUNITY): Payer: Self-pay

## 2011-07-30 ENCOUNTER — Encounter (HOSPITAL_COMMUNITY): Payer: Self-pay

## 2011-07-31 ENCOUNTER — Encounter (HOSPITAL_COMMUNITY): Payer: Self-pay

## 2011-08-01 ENCOUNTER — Encounter (HOSPITAL_COMMUNITY): Payer: Self-pay

## 2011-08-02 ENCOUNTER — Encounter (HOSPITAL_COMMUNITY): Payer: Self-pay

## 2011-08-05 ENCOUNTER — Encounter (HOSPITAL_COMMUNITY): Payer: Self-pay

## 2011-08-06 ENCOUNTER — Encounter (HOSPITAL_COMMUNITY)
Admission: RE | Admit: 2011-08-06 | Discharge: 2011-08-06 | Disposition: A | Discharge status: Home / Self Care | Payer: Self-pay | Source: Ambulatory Visit | Attending: Cardiovascular Disease | Admitting: Cardiovascular Disease

## 2011-08-07 ENCOUNTER — Encounter (HOSPITAL_COMMUNITY)
Admission: RE | Admit: 2011-08-07 | Discharge: 2011-08-07 | Disposition: A | Discharge status: Home / Self Care | Payer: Self-pay | Source: Ambulatory Visit | Attending: Cardiovascular Disease | Admitting: Cardiovascular Disease

## 2011-08-08 ENCOUNTER — Encounter (HOSPITAL_COMMUNITY)
Admission: RE | Admit: 2011-08-08 | Discharge: 2011-08-08 | Disposition: A | Discharge status: Home / Self Care | Payer: Self-pay | Source: Ambulatory Visit | Attending: Cardiovascular Disease | Admitting: Cardiovascular Disease

## 2011-08-09 ENCOUNTER — Encounter (HOSPITAL_COMMUNITY): Payer: Self-pay

## 2011-08-12 ENCOUNTER — Encounter (HOSPITAL_COMMUNITY): Payer: Self-pay

## 2011-08-13 ENCOUNTER — Encounter (HOSPITAL_COMMUNITY): Payer: Self-pay

## 2011-08-14 ENCOUNTER — Encounter (HOSPITAL_COMMUNITY): Payer: Self-pay

## 2011-08-15 ENCOUNTER — Encounter (HOSPITAL_COMMUNITY): Payer: Self-pay

## 2011-08-16 ENCOUNTER — Encounter (HOSPITAL_COMMUNITY): Payer: Self-pay

## 2011-08-19 ENCOUNTER — Encounter (HOSPITAL_COMMUNITY): Payer: Self-pay

## 2011-08-20 ENCOUNTER — Encounter (HOSPITAL_COMMUNITY)
Admission: RE | Admit: 2011-08-20 | Discharge: 2011-08-20 | Disposition: A | Discharge status: Home / Self Care | Payer: Self-pay | Source: Ambulatory Visit | Attending: Cardiovascular Disease | Admitting: Cardiovascular Disease

## 2011-08-20 DIAGNOSIS — E7800 Pure hypercholesterolemia, unspecified: Secondary | ICD-10-CM | POA: Insufficient documentation

## 2011-08-20 DIAGNOSIS — Z5189 Encounter for other specified aftercare: Secondary | ICD-10-CM | POA: Insufficient documentation

## 2011-08-20 DIAGNOSIS — Z8249 Family history of ischemic heart disease and other diseases of the circulatory system: Secondary | ICD-10-CM | POA: Insufficient documentation

## 2011-08-20 DIAGNOSIS — I10 Essential (primary) hypertension: Secondary | ICD-10-CM | POA: Insufficient documentation

## 2011-08-20 DIAGNOSIS — E669 Obesity, unspecified: Secondary | ICD-10-CM | POA: Insufficient documentation

## 2011-08-20 DIAGNOSIS — Z951 Presence of aortocoronary bypass graft: Secondary | ICD-10-CM | POA: Insufficient documentation

## 2011-08-20 DIAGNOSIS — I2510 Atherosclerotic heart disease of native coronary artery without angina pectoris: Secondary | ICD-10-CM | POA: Insufficient documentation

## 2011-08-21 ENCOUNTER — Encounter (HOSPITAL_COMMUNITY)
Admission: RE | Admit: 2011-08-21 | Discharge: 2011-08-21 | Disposition: A | Discharge status: Home / Self Care | Payer: Self-pay | Source: Ambulatory Visit | Attending: Cardiovascular Disease | Admitting: Cardiovascular Disease

## 2011-08-22 ENCOUNTER — Encounter (HOSPITAL_COMMUNITY): Payer: Self-pay

## 2011-08-23 ENCOUNTER — Encounter (HOSPITAL_COMMUNITY): Payer: Self-pay

## 2011-08-26 ENCOUNTER — Encounter (HOSPITAL_COMMUNITY): Payer: Self-pay

## 2011-08-27 ENCOUNTER — Encounter (HOSPITAL_COMMUNITY)
Admission: RE | Admit: 2011-08-27 | Discharge: 2011-08-27 | Disposition: A | Discharge status: Home / Self Care | Payer: Self-pay | Source: Ambulatory Visit | Attending: Cardiovascular Disease | Admitting: Cardiovascular Disease

## 2011-08-28 ENCOUNTER — Encounter (HOSPITAL_COMMUNITY)
Admission: RE | Admit: 2011-08-28 | Discharge: 2011-08-28 | Disposition: A | Discharge status: Home / Self Care | Payer: Self-pay | Source: Ambulatory Visit | Attending: Cardiovascular Disease | Admitting: Cardiovascular Disease

## 2011-08-29 ENCOUNTER — Encounter (HOSPITAL_COMMUNITY)
Admission: RE | Admit: 2011-08-29 | Discharge: 2011-08-29 | Disposition: A | Discharge status: Home / Self Care | Payer: Self-pay | Source: Ambulatory Visit | Attending: Cardiovascular Disease | Admitting: Cardiovascular Disease

## 2011-08-30 ENCOUNTER — Encounter (HOSPITAL_COMMUNITY): Payer: Self-pay

## 2011-09-02 ENCOUNTER — Encounter (HOSPITAL_COMMUNITY): Payer: Self-pay

## 2011-09-03 ENCOUNTER — Encounter (HOSPITAL_COMMUNITY)
Admission: RE | Admit: 2011-09-03 | Discharge: 2011-09-03 | Disposition: A | Discharge status: Home / Self Care | Payer: Self-pay | Source: Ambulatory Visit | Attending: Cardiovascular Disease | Admitting: Cardiovascular Disease

## 2011-09-04 ENCOUNTER — Encounter (HOSPITAL_COMMUNITY)
Admission: RE | Admit: 2011-09-04 | Discharge: 2011-09-04 | Disposition: A | Discharge status: Home / Self Care | Payer: Self-pay | Source: Ambulatory Visit | Attending: Cardiovascular Disease | Admitting: Cardiovascular Disease

## 2011-09-05 ENCOUNTER — Encounter (HOSPITAL_COMMUNITY)
Admission: RE | Admit: 2011-09-05 | Discharge: 2011-09-05 | Disposition: A | Discharge status: Home / Self Care | Payer: Self-pay | Source: Ambulatory Visit | Attending: Cardiovascular Disease | Admitting: Cardiovascular Disease

## 2011-09-06 ENCOUNTER — Encounter (HOSPITAL_COMMUNITY): Payer: Self-pay

## 2011-09-09 ENCOUNTER — Encounter (HOSPITAL_COMMUNITY): Payer: Self-pay

## 2011-09-10 ENCOUNTER — Encounter (HOSPITAL_COMMUNITY)
Admission: RE | Admit: 2011-09-10 | Discharge: 2011-09-10 | Disposition: A | Discharge status: Home / Self Care | Payer: Self-pay | Source: Ambulatory Visit | Attending: Cardiovascular Disease | Admitting: Cardiovascular Disease

## 2011-09-11 ENCOUNTER — Encounter (HOSPITAL_COMMUNITY): Payer: Self-pay

## 2011-09-12 ENCOUNTER — Encounter (HOSPITAL_COMMUNITY): Payer: Self-pay

## 2011-09-13 ENCOUNTER — Encounter (HOSPITAL_COMMUNITY): Payer: Self-pay

## 2011-09-16 ENCOUNTER — Encounter (HOSPITAL_COMMUNITY): Payer: Self-pay

## 2011-09-17 ENCOUNTER — Encounter (HOSPITAL_COMMUNITY): Payer: Self-pay

## 2011-09-17 DIAGNOSIS — E7800 Pure hypercholesterolemia, unspecified: Secondary | ICD-10-CM | POA: Insufficient documentation

## 2011-09-17 DIAGNOSIS — Z5189 Encounter for other specified aftercare: Secondary | ICD-10-CM | POA: Insufficient documentation

## 2011-09-17 DIAGNOSIS — E669 Obesity, unspecified: Secondary | ICD-10-CM | POA: Insufficient documentation

## 2011-09-17 DIAGNOSIS — I2510 Atherosclerotic heart disease of native coronary artery without angina pectoris: Secondary | ICD-10-CM | POA: Insufficient documentation

## 2011-09-17 DIAGNOSIS — Z8249 Family history of ischemic heart disease and other diseases of the circulatory system: Secondary | ICD-10-CM | POA: Insufficient documentation

## 2011-09-17 DIAGNOSIS — I10 Essential (primary) hypertension: Secondary | ICD-10-CM | POA: Insufficient documentation

## 2011-09-17 DIAGNOSIS — Z951 Presence of aortocoronary bypass graft: Secondary | ICD-10-CM | POA: Insufficient documentation

## 2011-09-18 ENCOUNTER — Encounter (HOSPITAL_COMMUNITY): Payer: Self-pay

## 2011-09-19 ENCOUNTER — Encounter (HOSPITAL_COMMUNITY): Payer: Self-pay

## 2011-09-20 ENCOUNTER — Encounter (HOSPITAL_COMMUNITY): Payer: Self-pay

## 2011-09-23 ENCOUNTER — Encounter (HOSPITAL_COMMUNITY): Payer: Self-pay

## 2011-09-24 ENCOUNTER — Encounter (HOSPITAL_COMMUNITY): Payer: Self-pay

## 2011-09-25 ENCOUNTER — Encounter (HOSPITAL_COMMUNITY)
Admission: RE | Admit: 2011-09-25 | Discharge: 2011-09-25 | Disposition: A | Discharge status: Home / Self Care | Payer: Self-pay | Source: Ambulatory Visit | Attending: Cardiovascular Disease | Admitting: Cardiovascular Disease

## 2011-09-26 ENCOUNTER — Encounter (HOSPITAL_COMMUNITY): Payer: Self-pay

## 2011-09-27 ENCOUNTER — Encounter (HOSPITAL_COMMUNITY): Payer: Self-pay

## 2011-09-30 ENCOUNTER — Encounter (HOSPITAL_COMMUNITY): Payer: Self-pay

## 2011-10-01 ENCOUNTER — Encounter (HOSPITAL_COMMUNITY)
Admission: RE | Admit: 2011-10-01 | Discharge: 2011-10-01 | Disposition: A | Discharge status: Home / Self Care | Payer: Self-pay | Source: Ambulatory Visit | Attending: Cardiovascular Disease | Admitting: Cardiovascular Disease

## 2011-10-02 ENCOUNTER — Encounter (HOSPITAL_COMMUNITY)
Admission: RE | Admit: 2011-10-02 | Discharge: 2011-10-02 | Disposition: A | Discharge status: Home / Self Care | Payer: Self-pay | Source: Ambulatory Visit | Attending: Cardiovascular Disease | Admitting: Cardiovascular Disease

## 2011-10-03 ENCOUNTER — Encounter (HOSPITAL_COMMUNITY)
Admission: RE | Admit: 2011-10-03 | Discharge: 2011-10-03 | Disposition: A | Discharge status: Home / Self Care | Payer: Self-pay | Source: Ambulatory Visit | Attending: Cardiovascular Disease | Admitting: Cardiovascular Disease

## 2011-10-04 ENCOUNTER — Encounter (HOSPITAL_COMMUNITY): Payer: Self-pay

## 2011-10-07 ENCOUNTER — Encounter (HOSPITAL_COMMUNITY): Payer: Self-pay

## 2011-10-08 ENCOUNTER — Encounter (HOSPITAL_COMMUNITY)
Admission: RE | Admit: 2011-10-08 | Discharge: 2011-10-08 | Disposition: A | Discharge status: Home / Self Care | Payer: Self-pay | Source: Ambulatory Visit | Attending: Cardiovascular Disease | Admitting: Cardiovascular Disease

## 2011-10-09 ENCOUNTER — Encounter (HOSPITAL_COMMUNITY)
Admission: RE | Admit: 2011-10-09 | Discharge: 2011-10-09 | Disposition: A | Discharge status: Home / Self Care | Payer: Self-pay | Source: Ambulatory Visit | Attending: Cardiovascular Disease | Admitting: Cardiovascular Disease

## 2011-10-10 ENCOUNTER — Encounter (HOSPITAL_COMMUNITY)
Admission: RE | Admit: 2011-10-10 | Discharge: 2011-10-10 | Disposition: A | Discharge status: Home / Self Care | Payer: Self-pay | Source: Ambulatory Visit | Attending: Cardiovascular Disease | Admitting: Cardiovascular Disease

## 2011-10-11 ENCOUNTER — Encounter (HOSPITAL_COMMUNITY): Payer: Self-pay

## 2011-10-14 ENCOUNTER — Encounter (HOSPITAL_COMMUNITY): Payer: Self-pay

## 2011-10-15 ENCOUNTER — Encounter (HOSPITAL_COMMUNITY)
Admission: RE | Admit: 2011-10-15 | Discharge: 2011-10-15 | Disposition: A | Discharge status: Home / Self Care | Payer: Self-pay | Source: Ambulatory Visit | Attending: Cardiovascular Disease | Admitting: Cardiovascular Disease

## 2011-10-16 ENCOUNTER — Encounter (HOSPITAL_COMMUNITY)
Admission: RE | Admit: 2011-10-16 | Discharge: 2011-10-16 | Disposition: A | Discharge status: Home / Self Care | Payer: Self-pay | Source: Ambulatory Visit | Attending: Cardiovascular Disease | Admitting: Cardiovascular Disease

## 2011-10-17 ENCOUNTER — Encounter (HOSPITAL_COMMUNITY): Payer: Self-pay

## 2011-10-17 DIAGNOSIS — Z5189 Encounter for other specified aftercare: Secondary | ICD-10-CM | POA: Insufficient documentation

## 2011-10-17 DIAGNOSIS — I10 Essential (primary) hypertension: Secondary | ICD-10-CM | POA: Insufficient documentation

## 2011-10-17 DIAGNOSIS — I2510 Atherosclerotic heart disease of native coronary artery without angina pectoris: Secondary | ICD-10-CM | POA: Insufficient documentation

## 2011-10-17 DIAGNOSIS — Z8249 Family history of ischemic heart disease and other diseases of the circulatory system: Secondary | ICD-10-CM | POA: Insufficient documentation

## 2011-10-17 DIAGNOSIS — Z951 Presence of aortocoronary bypass graft: Secondary | ICD-10-CM | POA: Insufficient documentation

## 2011-10-17 DIAGNOSIS — E7800 Pure hypercholesterolemia, unspecified: Secondary | ICD-10-CM | POA: Insufficient documentation

## 2011-10-17 DIAGNOSIS — E669 Obesity, unspecified: Secondary | ICD-10-CM | POA: Insufficient documentation

## 2011-10-18 ENCOUNTER — Encounter (HOSPITAL_COMMUNITY): Payer: Self-pay

## 2011-10-21 ENCOUNTER — Encounter (HOSPITAL_COMMUNITY): Payer: Self-pay

## 2011-10-22 ENCOUNTER — Encounter (HOSPITAL_COMMUNITY)
Admission: RE | Admit: 2011-10-22 | Discharge: 2011-10-22 | Disposition: A | Discharge status: Home / Self Care | Payer: Self-pay | Source: Ambulatory Visit | Attending: Cardiovascular Disease | Admitting: Cardiovascular Disease

## 2011-10-23 ENCOUNTER — Encounter (HOSPITAL_COMMUNITY): Admission: RE | Admit: 2011-10-23 | Payer: Self-pay | Source: Ambulatory Visit

## 2011-10-24 ENCOUNTER — Encounter (HOSPITAL_COMMUNITY)
Admission: RE | Admit: 2011-10-24 | Discharge: 2011-10-24 | Disposition: A | Discharge status: Home / Self Care | Payer: Self-pay | Source: Ambulatory Visit | Attending: Cardiovascular Disease | Admitting: Cardiovascular Disease

## 2011-10-25 ENCOUNTER — Encounter (HOSPITAL_COMMUNITY): Payer: Self-pay

## 2011-10-28 ENCOUNTER — Encounter (HOSPITAL_COMMUNITY): Payer: Self-pay

## 2011-10-29 ENCOUNTER — Encounter (HOSPITAL_COMMUNITY)
Admission: RE | Admit: 2011-10-29 | Discharge: 2011-10-29 | Disposition: A | Discharge status: Home / Self Care | Payer: Self-pay | Source: Ambulatory Visit | Attending: Cardiovascular Disease | Admitting: Cardiovascular Disease

## 2011-10-30 ENCOUNTER — Encounter (HOSPITAL_COMMUNITY)
Admission: RE | Admit: 2011-10-30 | Discharge: 2011-10-30 | Disposition: A | Discharge status: Home / Self Care | Payer: Self-pay | Source: Ambulatory Visit | Attending: Cardiovascular Disease | Admitting: Cardiovascular Disease

## 2011-10-31 ENCOUNTER — Encounter (HOSPITAL_COMMUNITY)
Admission: RE | Admit: 2011-10-31 | Discharge: 2011-10-31 | Disposition: A | Discharge status: Home / Self Care | Payer: Self-pay | Source: Ambulatory Visit | Attending: Cardiovascular Disease | Admitting: Cardiovascular Disease

## 2011-11-01 ENCOUNTER — Encounter (HOSPITAL_COMMUNITY): Payer: Self-pay

## 2011-11-04 ENCOUNTER — Encounter (HOSPITAL_COMMUNITY): Payer: Self-pay

## 2011-11-05 ENCOUNTER — Encounter (HOSPITAL_COMMUNITY)
Admission: RE | Admit: 2011-11-05 | Discharge: 2011-11-05 | Disposition: A | Discharge status: Home / Self Care | Payer: Self-pay | Source: Ambulatory Visit | Attending: Cardiovascular Disease | Admitting: Cardiovascular Disease

## 2011-11-06 ENCOUNTER — Encounter (HOSPITAL_COMMUNITY)
Admission: RE | Admit: 2011-11-06 | Discharge: 2011-11-06 | Disposition: A | Discharge status: Home / Self Care | Payer: Self-pay | Source: Ambulatory Visit | Attending: Cardiovascular Disease | Admitting: Cardiovascular Disease

## 2011-11-07 ENCOUNTER — Encounter (HOSPITAL_COMMUNITY)
Admission: RE | Admit: 2011-11-07 | Discharge: 2011-11-07 | Disposition: A | Discharge status: Home / Self Care | Payer: Self-pay | Source: Ambulatory Visit | Attending: Cardiovascular Disease | Admitting: Cardiovascular Disease

## 2011-11-08 ENCOUNTER — Encounter (HOSPITAL_COMMUNITY): Payer: Self-pay

## 2011-11-11 ENCOUNTER — Encounter (HOSPITAL_COMMUNITY): Payer: Self-pay

## 2011-11-12 ENCOUNTER — Encounter (HOSPITAL_COMMUNITY)
Admission: RE | Admit: 2011-11-12 | Discharge: 2011-11-12 | Disposition: A | Discharge status: Home / Self Care | Payer: Self-pay | Source: Ambulatory Visit | Attending: Cardiovascular Disease | Admitting: Cardiovascular Disease

## 2011-11-13 ENCOUNTER — Encounter (HOSPITAL_COMMUNITY)
Admission: RE | Admit: 2011-11-13 | Discharge: 2011-11-13 | Disposition: A | Discharge status: Home / Self Care | Payer: Self-pay | Source: Ambulatory Visit | Attending: Cardiovascular Disease | Admitting: Cardiovascular Disease

## 2011-11-14 ENCOUNTER — Encounter (HOSPITAL_COMMUNITY): Payer: Self-pay

## 2011-11-15 ENCOUNTER — Encounter (HOSPITAL_COMMUNITY): Payer: Self-pay

## 2011-11-18 ENCOUNTER — Encounter (HOSPITAL_COMMUNITY): Payer: Self-pay

## 2011-11-19 ENCOUNTER — Encounter (HOSPITAL_COMMUNITY): Payer: Self-pay

## 2011-11-19 DIAGNOSIS — Z951 Presence of aortocoronary bypass graft: Secondary | ICD-10-CM | POA: Insufficient documentation

## 2011-11-19 DIAGNOSIS — Z8249 Family history of ischemic heart disease and other diseases of the circulatory system: Secondary | ICD-10-CM | POA: Insufficient documentation

## 2011-11-19 DIAGNOSIS — I2510 Atherosclerotic heart disease of native coronary artery without angina pectoris: Secondary | ICD-10-CM | POA: Insufficient documentation

## 2011-11-19 DIAGNOSIS — I10 Essential (primary) hypertension: Secondary | ICD-10-CM | POA: Insufficient documentation

## 2011-11-19 DIAGNOSIS — E669 Obesity, unspecified: Secondary | ICD-10-CM | POA: Insufficient documentation

## 2011-11-19 DIAGNOSIS — Z5189 Encounter for other specified aftercare: Secondary | ICD-10-CM | POA: Insufficient documentation

## 2011-11-19 DIAGNOSIS — E7800 Pure hypercholesterolemia, unspecified: Secondary | ICD-10-CM | POA: Insufficient documentation

## 2011-11-20 ENCOUNTER — Encounter (HOSPITAL_COMMUNITY)
Admission: RE | Admit: 2011-11-20 | Discharge: 2011-11-20 | Disposition: A | Discharge status: Home / Self Care | Payer: Self-pay | Source: Ambulatory Visit | Attending: Cardiovascular Disease | Admitting: Cardiovascular Disease

## 2011-11-21 ENCOUNTER — Other Ambulatory Visit: Payer: Self-pay | Admitting: Cardiovascular Disease

## 2011-11-21 ENCOUNTER — Encounter (HOSPITAL_COMMUNITY)
Admission: RE | Admit: 2011-11-21 | Discharge: 2011-11-21 | Disposition: A | Discharge status: Home / Self Care | Payer: Self-pay | Source: Ambulatory Visit | Attending: Cardiovascular Disease | Admitting: Cardiovascular Disease

## 2011-11-22 ENCOUNTER — Encounter (HOSPITAL_COMMUNITY): Payer: Self-pay

## 2011-11-22 NOTE — Telephone Encounter (Signed)
Fax Received. Refill Completed. Everlie Eble Chowoe (R.M.A)   

## 2011-11-25 ENCOUNTER — Encounter (HOSPITAL_COMMUNITY): Payer: Self-pay

## 2011-11-26 ENCOUNTER — Encounter (HOSPITAL_COMMUNITY)
Admission: RE | Admit: 2011-11-26 | Discharge: 2011-11-26 | Disposition: A | Discharge status: Home / Self Care | Payer: Self-pay | Source: Ambulatory Visit | Attending: Cardiovascular Disease | Admitting: Cardiovascular Disease

## 2011-11-27 ENCOUNTER — Encounter (HOSPITAL_COMMUNITY)
Admission: RE | Admit: 2011-11-27 | Discharge: 2011-11-27 | Disposition: A | Discharge status: Home / Self Care | Payer: Self-pay | Source: Ambulatory Visit | Attending: Cardiovascular Disease | Admitting: Cardiovascular Disease

## 2011-11-28 ENCOUNTER — Encounter (HOSPITAL_COMMUNITY): Payer: Self-pay

## 2011-11-29 ENCOUNTER — Encounter (HOSPITAL_COMMUNITY): Payer: Self-pay

## 2011-12-02 ENCOUNTER — Encounter (HOSPITAL_COMMUNITY): Payer: Self-pay

## 2011-12-03 ENCOUNTER — Encounter (HOSPITAL_COMMUNITY): Payer: Self-pay

## 2011-12-04 ENCOUNTER — Encounter (HOSPITAL_COMMUNITY): Payer: Self-pay

## 2011-12-05 ENCOUNTER — Encounter (HOSPITAL_COMMUNITY): Payer: Self-pay

## 2011-12-06 ENCOUNTER — Encounter (HOSPITAL_COMMUNITY): Payer: Self-pay

## 2011-12-09 ENCOUNTER — Encounter (HOSPITAL_COMMUNITY): Payer: Self-pay

## 2011-12-10 ENCOUNTER — Encounter (HOSPITAL_COMMUNITY)
Admission: RE | Admit: 2011-12-10 | Discharge: 2011-12-10 | Disposition: A | Discharge status: Home / Self Care | Payer: Self-pay | Source: Ambulatory Visit | Attending: Cardiovascular Disease | Admitting: Cardiovascular Disease

## 2011-12-11 ENCOUNTER — Encounter (HOSPITAL_COMMUNITY)
Admission: RE | Admit: 2011-12-11 | Discharge: 2011-12-11 | Disposition: A | Discharge status: Home / Self Care | Payer: Self-pay | Source: Ambulatory Visit | Attending: Cardiovascular Disease | Admitting: Cardiovascular Disease

## 2011-12-12 ENCOUNTER — Encounter (HOSPITAL_COMMUNITY)
Admission: RE | Admit: 2011-12-12 | Discharge: 2011-12-12 | Disposition: A | Discharge status: Home / Self Care | Payer: Self-pay | Source: Ambulatory Visit | Attending: Cardiovascular Disease | Admitting: Cardiovascular Disease

## 2011-12-12 ENCOUNTER — Telehealth: Payer: Self-pay | Admitting: Cardiovascular Disease

## 2011-12-12 MED ORDER — NEBIVOLOL HCL 5 MG PO TABS: 5.0000 mg | ORAL_TABLET | Freq: Every day | ORAL | Status: DC

## 2011-12-12 NOTE — Telephone Encounter (Signed)
New Problem:    Patient called in needing a refill of his nebivolol (BYSTOLIC) 5 MG tablet.

## 2011-12-12 NOTE — Telephone Encounter (Signed)
Fax Received. Refill Completed. Krysti Hickling Chowoe (R.M.A)   

## 2011-12-13 ENCOUNTER — Encounter (HOSPITAL_COMMUNITY): Payer: Self-pay

## 2011-12-16 ENCOUNTER — Encounter (HOSPITAL_COMMUNITY): Payer: Self-pay

## 2011-12-17 ENCOUNTER — Encounter (HOSPITAL_COMMUNITY)
Admission: RE | Admit: 2011-12-17 | Discharge: 2011-12-17 | Disposition: A | Discharge status: Home / Self Care | Payer: Self-pay | Source: Ambulatory Visit | Attending: Cardiovascular Disease | Admitting: Cardiovascular Disease

## 2011-12-17 DIAGNOSIS — Z8249 Family history of ischemic heart disease and other diseases of the circulatory system: Secondary | ICD-10-CM | POA: Insufficient documentation

## 2011-12-17 DIAGNOSIS — E669 Obesity, unspecified: Secondary | ICD-10-CM | POA: Insufficient documentation

## 2011-12-17 DIAGNOSIS — I2510 Atherosclerotic heart disease of native coronary artery without angina pectoris: Secondary | ICD-10-CM | POA: Insufficient documentation

## 2011-12-17 DIAGNOSIS — I10 Essential (primary) hypertension: Secondary | ICD-10-CM | POA: Insufficient documentation

## 2011-12-17 DIAGNOSIS — Z951 Presence of aortocoronary bypass graft: Secondary | ICD-10-CM | POA: Insufficient documentation

## 2011-12-17 DIAGNOSIS — Z5189 Encounter for other specified aftercare: Secondary | ICD-10-CM | POA: Insufficient documentation

## 2011-12-17 DIAGNOSIS — E7800 Pure hypercholesterolemia, unspecified: Secondary | ICD-10-CM | POA: Insufficient documentation

## 2011-12-18 ENCOUNTER — Encounter (HOSPITAL_COMMUNITY): Payer: Self-pay

## 2011-12-19 ENCOUNTER — Encounter (HOSPITAL_COMMUNITY): Payer: Self-pay

## 2011-12-20 ENCOUNTER — Encounter (HOSPITAL_COMMUNITY): Payer: Self-pay

## 2011-12-23 ENCOUNTER — Encounter (HOSPITAL_COMMUNITY): Payer: Self-pay

## 2011-12-24 ENCOUNTER — Encounter (HOSPITAL_COMMUNITY)
Admission: RE | Admit: 2011-12-24 | Discharge: 2011-12-24 | Disposition: A | Discharge status: Home / Self Care | Payer: Self-pay | Source: Ambulatory Visit | Attending: Cardiovascular Disease | Admitting: Cardiovascular Disease

## 2011-12-25 ENCOUNTER — Encounter (HOSPITAL_COMMUNITY)
Admission: RE | Admit: 2011-12-25 | Discharge: 2011-12-25 | Disposition: A | Discharge status: Home / Self Care | Payer: Self-pay | Source: Ambulatory Visit | Attending: Cardiovascular Disease | Admitting: Cardiovascular Disease

## 2011-12-26 ENCOUNTER — Encounter (HOSPITAL_COMMUNITY)
Admission: RE | Admit: 2011-12-26 | Discharge: 2011-12-26 | Disposition: A | Discharge status: Home / Self Care | Payer: Self-pay | Source: Ambulatory Visit | Attending: Cardiovascular Disease | Admitting: Cardiovascular Disease

## 2011-12-27 ENCOUNTER — Encounter (HOSPITAL_COMMUNITY): Payer: Self-pay

## 2011-12-30 ENCOUNTER — Encounter (HOSPITAL_COMMUNITY): Payer: Self-pay

## 2011-12-30 ENCOUNTER — Encounter (HOSPITAL_COMMUNITY)
Admission: RE | Admit: 2011-12-30 | Discharge: 2011-12-30 | Disposition: A | Discharge status: Home / Self Care | Payer: Self-pay | Source: Ambulatory Visit | Attending: Cardiovascular Disease | Admitting: Cardiovascular Disease

## 2011-12-31 ENCOUNTER — Encounter (HOSPITAL_COMMUNITY): Payer: Self-pay

## 2012-01-01 ENCOUNTER — Encounter (HOSPITAL_COMMUNITY): Payer: Self-pay

## 2012-01-02 ENCOUNTER — Encounter (HOSPITAL_COMMUNITY): Payer: Self-pay

## 2012-01-03 ENCOUNTER — Encounter (HOSPITAL_COMMUNITY): Payer: Self-pay

## 2012-01-06 ENCOUNTER — Encounter (HOSPITAL_COMMUNITY): Payer: Self-pay

## 2012-01-07 ENCOUNTER — Encounter (HOSPITAL_COMMUNITY)
Admission: RE | Admit: 2012-01-07 | Discharge: 2012-01-07 | Disposition: A | Discharge status: Home / Self Care | Payer: Self-pay | Source: Ambulatory Visit | Attending: Cardiovascular Disease | Admitting: Cardiovascular Disease

## 2012-01-08 ENCOUNTER — Ambulatory Visit (INDEPENDENT_AMBULATORY_CARE_PROVIDER_SITE_OTHER): Payer: Medicare Other | Admitting: Cardiovascular Disease

## 2012-01-08 ENCOUNTER — Encounter: Payer: Self-pay | Admitting: Cardiovascular Disease

## 2012-01-08 ENCOUNTER — Encounter (HOSPITAL_COMMUNITY)
Admission: RE | Admit: 2012-01-08 | Discharge: 2012-01-08 | Disposition: A | Discharge status: Home / Self Care | Payer: Self-pay | Source: Ambulatory Visit | Attending: Cardiovascular Disease | Admitting: Cardiovascular Disease

## 2012-01-08 VITALS — BP 118/72 | HR 60 | Resp 16 | Ht 68.0 in | Wt 185.8 lb

## 2012-01-08 DIAGNOSIS — I25810 Atherosclerosis of coronary artery bypass graft(s) without angina pectoris: Secondary | ICD-10-CM

## 2012-01-08 DIAGNOSIS — I2510 Atherosclerotic heart disease of native coronary artery without angina pectoris: Secondary | ICD-10-CM

## 2012-01-08 DIAGNOSIS — E785 Hyperlipidemia, unspecified: Secondary | ICD-10-CM

## 2012-01-08 LAB — LIPID PANEL
LDL Cholesterol: 63 mg/dL (ref 0–99)
Total CHOL/HDL Ratio: 3
Triglycerides: 78 mg/dL (ref 0.0–149.0)

## 2012-01-08 LAB — BASIC METABOLIC PANEL
BUN: 13 mg/dL (ref 6–23)
CO2: 28 mEq/L (ref 19–32)
Calcium: 9.3 mg/dL (ref 8.4–10.5)
Chloride: 105 mEq/L (ref 96–112)
Creatinine, Ser: 0.9 mg/dL (ref 0.4–1.5)
Glucose, Bld: 96 mg/dL (ref 70–99)

## 2012-01-08 LAB — HEPATIC FUNCTION PANEL
AST: 16 U/L (ref 0–37)
Alkaline Phosphatase: 42 U/L (ref 39–117)
Total Bilirubin: 2 mg/dL — ABNORMAL HIGH (ref 0.3–1.2)

## 2012-01-08 NOTE — Progress Notes (Signed)
William Hernandez Date of Birth  11/15/1945 [facility] [address]    [address] [place], [place]  [address] [phone]  Fax  [phone]  Problems 1. CAD 2. Dyslipidemia 3. Atrial fibrillation 4. Depression  History of Present Illness:  William Hernandez is a 66 year old gentleman with a history of coronary disease-status post coronary artery bypass grafting.  He's done very well from a cardiac standpoint. He is participating in cardiac rehabilitation and has not had any episodes of chest pain or shortness breath.  He has done well.  His lipids are well controlled.    He is participating in cardiac rehab and is doing well.  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 He doesn't have any episodes of chest pain or shortness breath when he is active.  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  He continues to have issues with anxiety.   Current Outpatient Prescriptions on File Prior to Visit  Medication Sig Dispense Refill  . aspirin 325 MG EC tablet Take 325 mg by mouth daily.        Marland Kitchen atorvastatin (LIPITOR) 20 MG tablet TAKE 1 TABLET BY MOUTH ONCE DAILY  90 tablet  3  . fish oil-omega-3 fatty acids 1000 MG capsule Take 1 g by mouth daily.       . Flaxseed, Linseed, (FLAX SEED OIL PO) Take by mouth daily.        . irbesartan (AVAPRO) 150 MG tablet Take 0.5 tablets (75 mg total) by mouth at bedtime.  90 tablet  3  . Multiple Vitamin (MULTIVITAMIN PO) Take by mouth daily.        . nebivolol (BYSTOLIC) 5 MG tablet Take 1 tablet (5 mg total) by mouth daily.  30 tablet  6  . ranitidine (ZANTAC) 150 MG capsule Take 150 mg by mouth every evening.        . TRAZODONE HCL PO Take 150 mg by mouth at bedtime as needed.         Allergies  Allergen Reactions  . Lisinopril   . Penicillins     Past Medical History  Diagnosis Date  . Hyperlipidemia   . Arrhythmia     afib  . Depression   . Coronary artery disease     2003    . S/P hernia surgery     Past Surgical History  Procedure Date  . Hernia repair   . Cholecystectomy   . Cardiac catheterization     11/2001  . Coronary artery bypass graft 2000    History  Smoking status  . Never Smoker   Smokeless tobacco  . Not on file    History  Alcohol Use No    Family History  Problem Relation Age of Onset  . Heart disease Father     Reviw of Systems:  Reviewed in the HPI.  All other systems are negative.  Physical Exam: BP 118/72  Pulse 60  Resp 16  Ht 5\' 8"  (1.727 m)  Wt 185 lb 12.8 oz (84.278 kg)  BMI 28.25 kg/m2 The patient is alert and oriented x 3.  The mood and affect are normal.   Skin: warm and dry.  Color is normal.    HEENT:   normal  Lungs: clear   Heart: RR, no murmurs    Abdomen: soft, good BS  Extremities:  No edema  Neuro:  Non focal  ECG:  Assessment / Plan:

## 2012-01-08 NOTE — Assessment & Plan Note (Signed)
William Hernandez is doing well.  He is very active - no chest pain.

## 2012-01-08 NOTE — Assessment & Plan Note (Signed)
We will continue with his same medication -20 mg atorvastatin.  recheck fasting today. We'll check him again in 6 months.

## 2012-01-08 NOTE — Patient Instructions (Addendum)
Your physician recommends that you continue on your current medications as directed. Please refer to the Current Medication list given to you today.  Your physician wants you to follow-up in: 6 months. You will receive a reminder letter in the mail two months in advance. If you don't receive a letter, please call our office to schedule the follow-up appointment.  

## 2012-01-09 ENCOUNTER — Encounter (HOSPITAL_COMMUNITY): Payer: Self-pay

## 2012-01-10 ENCOUNTER — Encounter (HOSPITAL_COMMUNITY): Payer: Self-pay

## 2012-01-13 ENCOUNTER — Encounter (HOSPITAL_COMMUNITY): Payer: Self-pay

## 2012-01-14 ENCOUNTER — Encounter (HOSPITAL_COMMUNITY)
Admission: RE | Admit: 2012-01-14 | Discharge: 2012-01-14 | Disposition: A | Discharge status: Home / Self Care | Payer: Self-pay | Source: Ambulatory Visit | Attending: Cardiovascular Disease | Admitting: Cardiovascular Disease

## 2012-01-15 ENCOUNTER — Encounter (HOSPITAL_COMMUNITY)
Admission: RE | Admit: 2012-01-15 | Discharge: 2012-01-15 | Disposition: A | Discharge status: Home / Self Care | Payer: Self-pay | Source: Ambulatory Visit | Attending: Cardiovascular Disease | Admitting: Cardiovascular Disease

## 2012-01-16 ENCOUNTER — Encounter (HOSPITAL_COMMUNITY)
Admission: RE | Admit: 2012-01-16 | Discharge: 2012-01-16 | Disposition: A | Discharge status: Home / Self Care | Payer: Self-pay | Source: Ambulatory Visit | Attending: Cardiovascular Disease | Admitting: Cardiovascular Disease

## 2012-01-17 ENCOUNTER — Encounter (HOSPITAL_COMMUNITY): Payer: Self-pay

## 2012-01-20 ENCOUNTER — Encounter: Payer: Self-pay | Admitting: Cardiovascular Disease

## 2012-01-20 ENCOUNTER — Encounter (HOSPITAL_COMMUNITY): Payer: Self-pay

## 2012-01-20 DIAGNOSIS — E669 Obesity, unspecified: Secondary | ICD-10-CM | POA: Insufficient documentation

## 2012-01-20 DIAGNOSIS — Z8249 Family history of ischemic heart disease and other diseases of the circulatory system: Secondary | ICD-10-CM | POA: Insufficient documentation

## 2012-01-20 DIAGNOSIS — Z5189 Encounter for other specified aftercare: Secondary | ICD-10-CM | POA: Insufficient documentation

## 2012-01-20 DIAGNOSIS — E7800 Pure hypercholesterolemia, unspecified: Secondary | ICD-10-CM | POA: Insufficient documentation

## 2012-01-20 DIAGNOSIS — I10 Essential (primary) hypertension: Secondary | ICD-10-CM | POA: Insufficient documentation

## 2012-01-20 DIAGNOSIS — I2510 Atherosclerotic heart disease of native coronary artery without angina pectoris: Secondary | ICD-10-CM | POA: Insufficient documentation

## 2012-01-20 DIAGNOSIS — Z951 Presence of aortocoronary bypass graft: Secondary | ICD-10-CM | POA: Insufficient documentation

## 2012-01-21 ENCOUNTER — Encounter (HOSPITAL_COMMUNITY)
Admission: RE | Admit: 2012-01-21 | Discharge: 2012-01-21 | Disposition: A | Discharge status: Home / Self Care | Payer: Self-pay | Source: Ambulatory Visit | Attending: Cardiovascular Disease | Admitting: Cardiovascular Disease

## 2012-01-22 ENCOUNTER — Encounter (HOSPITAL_COMMUNITY): Payer: Self-pay

## 2012-01-23 ENCOUNTER — Encounter (HOSPITAL_COMMUNITY)
Admission: RE | Admit: 2012-01-23 | Discharge: 2012-01-23 | Disposition: A | Discharge status: Home / Self Care | Payer: Self-pay | Source: Ambulatory Visit | Attending: Cardiovascular Disease | Admitting: Cardiovascular Disease

## 2012-01-24 ENCOUNTER — Encounter (HOSPITAL_COMMUNITY): Payer: Self-pay

## 2012-01-27 ENCOUNTER — Encounter (HOSPITAL_COMMUNITY): Payer: Self-pay

## 2012-01-28 ENCOUNTER — Encounter (HOSPITAL_COMMUNITY)
Admission: RE | Admit: 2012-01-28 | Discharge: 2012-01-28 | Disposition: A | Discharge status: Home / Self Care | Payer: Self-pay | Source: Ambulatory Visit | Attending: Cardiovascular Disease | Admitting: Cardiovascular Disease

## 2012-01-29 ENCOUNTER — Encounter (HOSPITAL_COMMUNITY)
Admission: RE | Admit: 2012-01-29 | Discharge: 2012-01-29 | Disposition: A | Discharge status: Home / Self Care | Payer: Self-pay | Source: Ambulatory Visit | Attending: Cardiovascular Disease | Admitting: Cardiovascular Disease

## 2012-01-30 ENCOUNTER — Encounter (HOSPITAL_COMMUNITY)
Admission: RE | Admit: 2012-01-30 | Discharge: 2012-01-30 | Disposition: A | Discharge status: Home / Self Care | Payer: Self-pay | Source: Ambulatory Visit | Attending: Cardiovascular Disease | Admitting: Cardiovascular Disease

## 2012-01-31 ENCOUNTER — Encounter (HOSPITAL_COMMUNITY): Payer: Self-pay

## 2012-02-03 ENCOUNTER — Encounter (HOSPITAL_COMMUNITY): Payer: Self-pay

## 2012-02-04 ENCOUNTER — Encounter (HOSPITAL_COMMUNITY): Payer: Self-pay

## 2012-02-05 ENCOUNTER — Encounter (HOSPITAL_COMMUNITY)
Admission: RE | Admit: 2012-02-05 | Discharge: 2012-02-05 | Disposition: A | Discharge status: Home / Self Care | Payer: Self-pay | Source: Ambulatory Visit | Attending: Cardiovascular Disease | Admitting: Cardiovascular Disease

## 2012-02-06 ENCOUNTER — Encounter (HOSPITAL_COMMUNITY)
Admission: RE | Admit: 2012-02-06 | Discharge: 2012-02-06 | Disposition: A | Discharge status: Home / Self Care | Payer: Self-pay | Source: Ambulatory Visit | Attending: Cardiovascular Disease | Admitting: Cardiovascular Disease

## 2012-02-07 ENCOUNTER — Encounter (HOSPITAL_COMMUNITY): Payer: Self-pay

## 2012-02-10 ENCOUNTER — Encounter (HOSPITAL_COMMUNITY): Payer: Self-pay

## 2012-02-11 ENCOUNTER — Encounter (HOSPITAL_COMMUNITY): Payer: Self-pay

## 2012-02-12 ENCOUNTER — Encounter (HOSPITAL_COMMUNITY): Payer: Self-pay

## 2012-02-13 ENCOUNTER — Encounter (HOSPITAL_COMMUNITY): Payer: Self-pay

## 2012-02-14 ENCOUNTER — Encounter (HOSPITAL_COMMUNITY): Payer: Self-pay

## 2012-02-17 ENCOUNTER — Encounter (HOSPITAL_COMMUNITY): Payer: Self-pay

## 2012-02-18 ENCOUNTER — Encounter (HOSPITAL_COMMUNITY)
Admission: RE | Admit: 2012-02-18 | Discharge: 2012-02-18 | Disposition: A | Discharge status: Home / Self Care | Payer: Self-pay | Source: Ambulatory Visit | Attending: Cardiovascular Disease | Admitting: Cardiovascular Disease

## 2012-02-18 DIAGNOSIS — Z5189 Encounter for other specified aftercare: Secondary | ICD-10-CM | POA: Insufficient documentation

## 2012-02-18 DIAGNOSIS — E669 Obesity, unspecified: Secondary | ICD-10-CM | POA: Insufficient documentation

## 2012-02-18 DIAGNOSIS — E7800 Pure hypercholesterolemia, unspecified: Secondary | ICD-10-CM | POA: Insufficient documentation

## 2012-02-18 DIAGNOSIS — Z951 Presence of aortocoronary bypass graft: Secondary | ICD-10-CM | POA: Insufficient documentation

## 2012-02-18 DIAGNOSIS — I2510 Atherosclerotic heart disease of native coronary artery without angina pectoris: Secondary | ICD-10-CM | POA: Insufficient documentation

## 2012-02-18 DIAGNOSIS — I10 Essential (primary) hypertension: Secondary | ICD-10-CM | POA: Insufficient documentation

## 2012-02-18 DIAGNOSIS — Z8249 Family history of ischemic heart disease and other diseases of the circulatory system: Secondary | ICD-10-CM | POA: Insufficient documentation

## 2012-02-19 ENCOUNTER — Encounter (HOSPITAL_COMMUNITY)
Admission: RE | Admit: 2012-02-19 | Discharge: 2012-02-19 | Disposition: A | Discharge status: Home / Self Care | Payer: Self-pay | Source: Ambulatory Visit | Attending: Cardiovascular Disease | Admitting: Cardiovascular Disease

## 2012-02-20 ENCOUNTER — Encounter (HOSPITAL_COMMUNITY): Payer: Self-pay

## 2012-02-21 ENCOUNTER — Encounter (HOSPITAL_COMMUNITY): Payer: Self-pay

## 2012-02-24 ENCOUNTER — Encounter (HOSPITAL_COMMUNITY): Payer: Self-pay

## 2012-02-25 ENCOUNTER — Encounter (HOSPITAL_COMMUNITY): Payer: Self-pay

## 2012-02-26 ENCOUNTER — Encounter (HOSPITAL_COMMUNITY): Payer: Self-pay

## 2012-02-27 ENCOUNTER — Encounter (HOSPITAL_COMMUNITY): Payer: Self-pay

## 2012-02-28 ENCOUNTER — Encounter (HOSPITAL_COMMUNITY): Payer: Self-pay

## 2012-03-02 ENCOUNTER — Encounter (HOSPITAL_COMMUNITY): Payer: Self-pay

## 2012-03-02 ENCOUNTER — Ambulatory Visit (INDEPENDENT_AMBULATORY_CARE_PROVIDER_SITE_OTHER): Payer: Medicare Other | Admitting: Family Medicine

## 2012-03-02 ENCOUNTER — Encounter: Payer: Self-pay | Admitting: Family Medicine

## 2012-03-02 VITALS — Wt 164.0 lb

## 2012-03-02 DIAGNOSIS — Z049 Encounter for examination and observation for unspecified reason: Secondary | ICD-10-CM

## 2012-03-03 ENCOUNTER — Encounter (HOSPITAL_COMMUNITY)
Admission: RE | Admit: 2012-03-03 | Discharge: 2012-03-03 | Disposition: A | Discharge status: Home / Self Care | Payer: Self-pay | Source: Ambulatory Visit | Attending: Cardiovascular Disease | Admitting: Cardiovascular Disease

## 2012-03-03 NOTE — Progress Notes (Signed)
  Subjective:    Patient ID: William Hernandez, male    DOB: 18-Aug-1945, 66 y.o.   MRN: [id number]  HPI    Review of Systems     Objective:   Physical Exam        Assessment & Plan:  Documentation error. Patient was seen on 12/16

## 2012-03-04 ENCOUNTER — Encounter (HOSPITAL_COMMUNITY)
Admission: RE | Admit: 2012-03-04 | Discharge: 2012-03-04 | Disposition: A | Discharge status: Home / Self Care | Payer: Self-pay | Source: Ambulatory Visit | Attending: Cardiovascular Disease | Admitting: Cardiovascular Disease

## 2012-03-05 ENCOUNTER — Encounter (HOSPITAL_COMMUNITY)
Admission: RE | Admit: 2012-03-05 | Discharge: 2012-03-05 | Disposition: A | Discharge status: Home / Self Care | Payer: Self-pay | Source: Ambulatory Visit | Attending: Cardiovascular Disease | Admitting: Cardiovascular Disease

## 2012-03-06 ENCOUNTER — Encounter (HOSPITAL_COMMUNITY): Payer: Self-pay

## 2012-03-09 ENCOUNTER — Encounter (HOSPITAL_COMMUNITY): Payer: Self-pay

## 2012-03-10 ENCOUNTER — Encounter (HOSPITAL_COMMUNITY)
Admission: RE | Admit: 2012-03-10 | Discharge: 2012-03-10 | Disposition: A | Discharge status: Home / Self Care | Payer: Self-pay | Source: Ambulatory Visit | Attending: Cardiovascular Disease | Admitting: Cardiovascular Disease

## 2012-03-11 ENCOUNTER — Encounter (HOSPITAL_COMMUNITY): Payer: Self-pay

## 2012-03-12 ENCOUNTER — Encounter (HOSPITAL_COMMUNITY)
Admission: RE | Admit: 2012-03-12 | Discharge: 2012-03-12 | Disposition: A | Discharge status: Home / Self Care | Payer: Self-pay | Source: Ambulatory Visit | Attending: Cardiovascular Disease | Admitting: Cardiovascular Disease

## 2012-03-13 ENCOUNTER — Encounter (HOSPITAL_COMMUNITY): Payer: Self-pay

## 2012-03-13 ENCOUNTER — Encounter (HOSPITAL_COMMUNITY)
Admission: RE | Admit: 2012-03-13 | Discharge: 2012-03-13 | Disposition: A | Discharge status: Home / Self Care | Payer: Self-pay | Source: Ambulatory Visit | Attending: Cardiovascular Disease | Admitting: Cardiovascular Disease

## 2012-03-16 ENCOUNTER — Encounter (HOSPITAL_COMMUNITY): Payer: Self-pay

## 2012-03-17 ENCOUNTER — Encounter (HOSPITAL_COMMUNITY): Payer: Self-pay

## 2012-03-18 ENCOUNTER — Encounter (HOSPITAL_COMMUNITY): Payer: Self-pay

## 2012-03-19 ENCOUNTER — Encounter (HOSPITAL_COMMUNITY)
Admission: RE | Admit: 2012-03-19 | Discharge: 2012-03-19 | Disposition: A | Discharge status: Home / Self Care | Payer: Self-pay | Source: Ambulatory Visit | Attending: Cardiovascular Disease | Admitting: Cardiovascular Disease

## 2012-03-19 DIAGNOSIS — E7800 Pure hypercholesterolemia, unspecified: Secondary | ICD-10-CM | POA: Insufficient documentation

## 2012-03-19 DIAGNOSIS — Z8249 Family history of ischemic heart disease and other diseases of the circulatory system: Secondary | ICD-10-CM | POA: Insufficient documentation

## 2012-03-19 DIAGNOSIS — I2510 Atherosclerotic heart disease of native coronary artery without angina pectoris: Secondary | ICD-10-CM | POA: Insufficient documentation

## 2012-03-19 DIAGNOSIS — E669 Obesity, unspecified: Secondary | ICD-10-CM | POA: Insufficient documentation

## 2012-03-19 DIAGNOSIS — Z951 Presence of aortocoronary bypass graft: Secondary | ICD-10-CM | POA: Insufficient documentation

## 2012-03-19 DIAGNOSIS — I10 Essential (primary) hypertension: Secondary | ICD-10-CM | POA: Insufficient documentation

## 2012-03-19 DIAGNOSIS — Z5189 Encounter for other specified aftercare: Secondary | ICD-10-CM | POA: Insufficient documentation

## 2012-03-20 ENCOUNTER — Encounter (HOSPITAL_COMMUNITY): Payer: Self-pay

## 2012-03-23 ENCOUNTER — Encounter (HOSPITAL_COMMUNITY)
Admission: RE | Admit: 2012-03-23 | Discharge: 2012-03-23 | Disposition: A | Discharge status: Home / Self Care | Payer: Self-pay | Source: Ambulatory Visit | Attending: Cardiovascular Disease | Admitting: Cardiovascular Disease

## 2012-03-23 ENCOUNTER — Encounter (HOSPITAL_COMMUNITY): Payer: Self-pay

## 2012-03-24 ENCOUNTER — Encounter (HOSPITAL_COMMUNITY): Payer: Self-pay

## 2012-03-25 ENCOUNTER — Encounter (HOSPITAL_COMMUNITY)
Admission: RE | Admit: 2012-03-25 | Discharge: 2012-03-25 | Disposition: A | Discharge status: Home / Self Care | Payer: Self-pay | Source: Ambulatory Visit | Attending: Cardiovascular Disease | Admitting: Cardiovascular Disease

## 2012-03-26 ENCOUNTER — Encounter (HOSPITAL_COMMUNITY)
Admission: RE | Admit: 2012-03-26 | Discharge: 2012-03-26 | Disposition: A | Discharge status: Home / Self Care | Payer: Self-pay | Source: Ambulatory Visit | Attending: Cardiovascular Disease | Admitting: Cardiovascular Disease

## 2012-03-27 ENCOUNTER — Encounter (HOSPITAL_COMMUNITY): Payer: Self-pay

## 2012-03-30 ENCOUNTER — Encounter (HOSPITAL_COMMUNITY): Payer: Self-pay

## 2012-03-31 ENCOUNTER — Encounter (HOSPITAL_COMMUNITY): Payer: Self-pay

## 2012-04-01 ENCOUNTER — Encounter (HOSPITAL_COMMUNITY)
Admission: RE | Admit: 2012-04-01 | Discharge: 2012-04-01 | Disposition: A | Discharge status: Home / Self Care | Payer: Self-pay | Source: Ambulatory Visit | Attending: Cardiovascular Disease | Admitting: Cardiovascular Disease

## 2012-04-02 ENCOUNTER — Encounter (HOSPITAL_COMMUNITY)
Admission: RE | Admit: 2012-04-02 | Discharge: 2012-04-02 | Disposition: A | Discharge status: Home / Self Care | Payer: Self-pay | Source: Ambulatory Visit | Attending: Cardiovascular Disease | Admitting: Cardiovascular Disease

## 2012-04-03 ENCOUNTER — Encounter (HOSPITAL_COMMUNITY): Payer: Self-pay

## 2012-04-06 ENCOUNTER — Encounter (HOSPITAL_COMMUNITY): Payer: Self-pay

## 2012-04-07 ENCOUNTER — Encounter (HOSPITAL_COMMUNITY)
Admission: RE | Admit: 2012-04-07 | Discharge: 2012-04-07 | Disposition: A | Discharge status: Home / Self Care | Payer: Self-pay | Source: Ambulatory Visit | Attending: Cardiovascular Disease | Admitting: Cardiovascular Disease

## 2012-04-08 ENCOUNTER — Encounter (HOSPITAL_COMMUNITY): Payer: Self-pay

## 2012-04-09 ENCOUNTER — Encounter (HOSPITAL_COMMUNITY)
Admission: RE | Admit: 2012-04-09 | Discharge: 2012-04-09 | Disposition: A | Discharge status: Home / Self Care | Payer: Self-pay | Source: Ambulatory Visit | Attending: Cardiovascular Disease | Admitting: Cardiovascular Disease

## 2012-04-10 ENCOUNTER — Telehealth: Payer: Self-pay | Admitting: Cardiovascular Disease

## 2012-04-10 ENCOUNTER — Encounter (HOSPITAL_COMMUNITY): Payer: Self-pay

## 2012-04-10 DIAGNOSIS — I2510 Atherosclerotic heart disease of native coronary artery without angina pectoris: Secondary | ICD-10-CM

## 2012-04-10 DIAGNOSIS — N400 Enlarged prostate without lower urinary tract symptoms: Secondary | ICD-10-CM

## 2012-04-10 DIAGNOSIS — E785 Hyperlipidemia, unspecified: Secondary | ICD-10-CM

## 2012-04-10 NOTE — Telephone Encounter (Signed)
**Note De-Identified Uel Davidow Obfuscation** No answer and no way to leave message (VM is full). Will forward call to Dr. Harvie Bridge nurse, Michael Litter.

## 2012-04-10 NOTE — Telephone Encounter (Signed)
New problem:    Need lab work prior to appt in April

## 2012-04-13 ENCOUNTER — Encounter (HOSPITAL_COMMUNITY): Payer: Self-pay

## 2012-04-14 ENCOUNTER — Encounter (HOSPITAL_COMMUNITY)
Admission: RE | Admit: 2012-04-14 | Discharge: 2012-04-14 | Disposition: A | Discharge status: Home / Self Care | Payer: Self-pay | Source: Ambulatory Visit | Attending: Cardiovascular Disease | Admitting: Cardiovascular Disease

## 2012-04-14 NOTE — Telephone Encounter (Signed)
Pt was given a lab date, labs ordered, pt requested an order for a psa also.

## 2012-04-15 ENCOUNTER — Encounter (HOSPITAL_COMMUNITY): Payer: Self-pay

## 2012-04-16 ENCOUNTER — Encounter (HOSPITAL_COMMUNITY): Payer: Self-pay

## 2012-04-17 ENCOUNTER — Encounter (HOSPITAL_COMMUNITY)
Admission: RE | Admit: 2012-04-17 | Discharge: 2012-04-17 | Disposition: A | Discharge status: Home / Self Care | Payer: Self-pay | Source: Ambulatory Visit | Attending: Cardiovascular Disease | Admitting: Cardiovascular Disease

## 2012-04-17 ENCOUNTER — Encounter (HOSPITAL_COMMUNITY): Payer: Self-pay

## 2012-04-21 ENCOUNTER — Encounter (HOSPITAL_COMMUNITY)
Admission: RE | Admit: 2012-04-21 | Discharge: 2012-04-21 | Disposition: A | Discharge status: Home / Self Care | Payer: Self-pay | Source: Ambulatory Visit | Attending: Cardiovascular Disease | Admitting: Cardiovascular Disease

## 2012-04-21 DIAGNOSIS — Z951 Presence of aortocoronary bypass graft: Secondary | ICD-10-CM | POA: Insufficient documentation

## 2012-04-21 DIAGNOSIS — Z5189 Encounter for other specified aftercare: Secondary | ICD-10-CM | POA: Insufficient documentation

## 2012-04-21 DIAGNOSIS — I2510 Atherosclerotic heart disease of native coronary artery without angina pectoris: Secondary | ICD-10-CM | POA: Insufficient documentation

## 2012-04-21 DIAGNOSIS — I10 Essential (primary) hypertension: Secondary | ICD-10-CM | POA: Insufficient documentation

## 2012-04-21 DIAGNOSIS — E669 Obesity, unspecified: Secondary | ICD-10-CM | POA: Insufficient documentation

## 2012-04-21 DIAGNOSIS — E7800 Pure hypercholesterolemia, unspecified: Secondary | ICD-10-CM | POA: Insufficient documentation

## 2012-04-21 DIAGNOSIS — Z8249 Family history of ischemic heart disease and other diseases of the circulatory system: Secondary | ICD-10-CM | POA: Insufficient documentation

## 2012-04-22 ENCOUNTER — Other Ambulatory Visit: Payer: Self-pay | Admitting: *Deleted

## 2012-04-22 ENCOUNTER — Encounter (HOSPITAL_COMMUNITY)
Admission: RE | Admit: 2012-04-22 | Discharge: 2012-04-22 | Disposition: A | Discharge status: Home / Self Care | Payer: Self-pay | Source: Ambulatory Visit | Attending: Cardiovascular Disease | Admitting: Cardiovascular Disease

## 2012-04-22 MED ORDER — IRBESARTAN 150 MG PO TABS: 75.0000 mg | ORAL_TABLET | Freq: Every day | ORAL | Status: DC

## 2012-04-22 NOTE — Telephone Encounter (Signed)
Fax Received. Refill Completed. William Hernandez (R.M.A)  LAST SEEN 01-08-1012

## 2012-04-23 ENCOUNTER — Encounter (HOSPITAL_COMMUNITY)
Admission: RE | Admit: 2012-04-23 | Discharge: 2012-04-23 | Disposition: A | Discharge status: Home / Self Care | Payer: Self-pay | Source: Ambulatory Visit | Attending: Cardiovascular Disease | Admitting: Cardiovascular Disease

## 2012-04-24 ENCOUNTER — Other Ambulatory Visit: Payer: Self-pay | Admitting: *Deleted

## 2012-04-24 NOTE — Telephone Encounter (Signed)
Opened in Error.

## 2012-04-28 ENCOUNTER — Encounter (HOSPITAL_COMMUNITY): Payer: Self-pay

## 2012-04-29 ENCOUNTER — Encounter (HOSPITAL_COMMUNITY): Payer: Self-pay

## 2012-04-29 ENCOUNTER — Encounter (HOSPITAL_COMMUNITY)
Admission: RE | Admit: 2012-04-29 | Discharge: 2012-04-29 | Disposition: A | Discharge status: Home / Self Care | Payer: Self-pay | Source: Ambulatory Visit | Attending: Cardiovascular Disease | Admitting: Cardiovascular Disease

## 2012-04-30 ENCOUNTER — Encounter (HOSPITAL_COMMUNITY): Payer: Self-pay

## 2012-05-05 ENCOUNTER — Encounter (HOSPITAL_COMMUNITY)
Admission: RE | Admit: 2012-05-05 | Discharge: 2012-05-05 | Disposition: A | Discharge status: Home / Self Care | Payer: Self-pay | Source: Ambulatory Visit | Attending: Cardiovascular Disease | Admitting: Cardiovascular Disease

## 2012-05-06 ENCOUNTER — Encounter (HOSPITAL_COMMUNITY): Payer: Self-pay

## 2012-05-07 ENCOUNTER — Encounter (HOSPITAL_COMMUNITY)
Admission: RE | Admit: 2012-05-07 | Discharge: 2012-05-07 | Disposition: A | Discharge status: Home / Self Care | Payer: Self-pay | Source: Ambulatory Visit | Attending: Cardiovascular Disease | Admitting: Cardiovascular Disease

## 2012-05-12 ENCOUNTER — Encounter (HOSPITAL_COMMUNITY)
Admission: RE | Admit: 2012-05-12 | Discharge: 2012-05-12 | Disposition: A | Discharge status: Home / Self Care | Payer: Self-pay | Source: Ambulatory Visit | Attending: Cardiovascular Disease | Admitting: Cardiovascular Disease

## 2012-05-13 ENCOUNTER — Encounter (HOSPITAL_COMMUNITY)
Admission: RE | Admit: 2012-05-13 | Discharge: 2012-05-13 | Disposition: A | Discharge status: Home / Self Care | Payer: Self-pay | Source: Ambulatory Visit | Attending: Cardiovascular Disease | Admitting: Cardiovascular Disease

## 2012-05-14 ENCOUNTER — Encounter (HOSPITAL_COMMUNITY)
Admission: RE | Admit: 2012-05-14 | Discharge: 2012-05-14 | Disposition: A | Discharge status: Home / Self Care | Payer: Self-pay | Source: Ambulatory Visit | Attending: Cardiovascular Disease | Admitting: Cardiovascular Disease

## 2012-05-19 ENCOUNTER — Encounter (HOSPITAL_COMMUNITY): Payer: Self-pay

## 2012-05-19 DIAGNOSIS — E669 Obesity, unspecified: Secondary | ICD-10-CM | POA: Insufficient documentation

## 2012-05-19 DIAGNOSIS — Z5189 Encounter for other specified aftercare: Secondary | ICD-10-CM | POA: Insufficient documentation

## 2012-05-19 DIAGNOSIS — E7800 Pure hypercholesterolemia, unspecified: Secondary | ICD-10-CM | POA: Insufficient documentation

## 2012-05-19 DIAGNOSIS — Z951 Presence of aortocoronary bypass graft: Secondary | ICD-10-CM | POA: Insufficient documentation

## 2012-05-19 DIAGNOSIS — I2510 Atherosclerotic heart disease of native coronary artery without angina pectoris: Secondary | ICD-10-CM | POA: Insufficient documentation

## 2012-05-19 DIAGNOSIS — I10 Essential (primary) hypertension: Secondary | ICD-10-CM | POA: Insufficient documentation

## 2012-05-19 DIAGNOSIS — Z8249 Family history of ischemic heart disease and other diseases of the circulatory system: Secondary | ICD-10-CM | POA: Insufficient documentation

## 2012-05-20 ENCOUNTER — Encounter (HOSPITAL_COMMUNITY)
Admission: RE | Admit: 2012-05-20 | Discharge: 2012-05-20 | Disposition: A | Discharge status: Home / Self Care | Payer: Self-pay | Source: Ambulatory Visit | Attending: Cardiovascular Disease | Admitting: Cardiovascular Disease

## 2012-05-21 ENCOUNTER — Encounter (HOSPITAL_COMMUNITY): Payer: Self-pay

## 2012-05-26 ENCOUNTER — Encounter (HOSPITAL_COMMUNITY): Payer: Self-pay

## 2012-05-27 ENCOUNTER — Encounter (HOSPITAL_COMMUNITY): Payer: Self-pay

## 2012-05-28 ENCOUNTER — Encounter (HOSPITAL_COMMUNITY)
Admission: RE | Admit: 2012-05-28 | Discharge: 2012-05-28 | Disposition: A | Discharge status: Home / Self Care | Payer: Self-pay | Source: Ambulatory Visit | Attending: Cardiovascular Disease | Admitting: Cardiovascular Disease

## 2012-06-02 ENCOUNTER — Encounter (HOSPITAL_COMMUNITY): Payer: Self-pay

## 2012-06-03 ENCOUNTER — Encounter (HOSPITAL_COMMUNITY): Payer: Self-pay

## 2012-06-04 ENCOUNTER — Encounter (HOSPITAL_COMMUNITY)
Admission: RE | Admit: 2012-06-04 | Discharge: 2012-06-04 | Disposition: A | Discharge status: Home / Self Care | Payer: Self-pay | Source: Ambulatory Visit | Attending: Cardiovascular Disease | Admitting: Cardiovascular Disease

## 2012-06-05 ENCOUNTER — Ambulatory Visit (HOSPITAL_COMMUNITY): Payer: Medicare Other

## 2012-06-09 ENCOUNTER — Encounter (HOSPITAL_COMMUNITY)
Admission: RE | Admit: 2012-06-09 | Discharge: 2012-06-09 | Disposition: A | Discharge status: Home / Self Care | Payer: Self-pay | Source: Ambulatory Visit | Attending: Cardiovascular Disease | Admitting: Cardiovascular Disease

## 2012-06-10 ENCOUNTER — Encounter (HOSPITAL_COMMUNITY): Payer: Self-pay

## 2012-06-11 ENCOUNTER — Encounter (HOSPITAL_COMMUNITY)
Admission: RE | Admit: 2012-06-11 | Discharge: 2012-06-11 | Disposition: A | Discharge status: Home / Self Care | Payer: Self-pay | Source: Ambulatory Visit | Attending: Cardiovascular Disease | Admitting: Cardiovascular Disease

## 2012-06-16 ENCOUNTER — Encounter (HOSPITAL_COMMUNITY)
Admission: RE | Admit: 2012-06-16 | Discharge: 2012-06-16 | Disposition: A | Discharge status: Home / Self Care | Payer: Self-pay | Source: Ambulatory Visit | Attending: Cardiovascular Disease | Admitting: Cardiovascular Disease

## 2012-06-16 DIAGNOSIS — Z8249 Family history of ischemic heart disease and other diseases of the circulatory system: Secondary | ICD-10-CM | POA: Insufficient documentation

## 2012-06-16 DIAGNOSIS — E669 Obesity, unspecified: Secondary | ICD-10-CM | POA: Insufficient documentation

## 2012-06-16 DIAGNOSIS — Z951 Presence of aortocoronary bypass graft: Secondary | ICD-10-CM | POA: Insufficient documentation

## 2012-06-16 DIAGNOSIS — E7800 Pure hypercholesterolemia, unspecified: Secondary | ICD-10-CM | POA: Insufficient documentation

## 2012-06-16 DIAGNOSIS — I10 Essential (primary) hypertension: Secondary | ICD-10-CM | POA: Insufficient documentation

## 2012-06-16 DIAGNOSIS — I2510 Atherosclerotic heart disease of native coronary artery without angina pectoris: Secondary | ICD-10-CM | POA: Insufficient documentation

## 2012-06-16 DIAGNOSIS — Z5189 Encounter for other specified aftercare: Secondary | ICD-10-CM | POA: Insufficient documentation

## 2012-06-17 ENCOUNTER — Encounter (HOSPITAL_COMMUNITY): Payer: Self-pay

## 2012-06-18 ENCOUNTER — Encounter (HOSPITAL_COMMUNITY)
Admission: RE | Admit: 2012-06-18 | Discharge: 2012-06-18 | Disposition: A | Discharge status: Home / Self Care | Payer: Self-pay | Source: Ambulatory Visit | Attending: Cardiovascular Disease | Admitting: Cardiovascular Disease

## 2012-06-18 ENCOUNTER — Telehealth: Payer: Self-pay | Admitting: Cardiovascular Disease

## 2012-06-18 NOTE — Telephone Encounter (Signed)
Nose bleed x 1 and he held asa 325 mg x 2 days, told pt to return to asa dose tonight, call with further concerns, pt has app this month. Encouraged normal saline spray 2 x daily. Pt agreed to plan.

## 2012-06-18 NOTE — Telephone Encounter (Signed)
New Prob   Pt had a nose bleed yesterday and would like to know if he should stop taking his daily dose of aspirin or go down to a lower dose. Would like to speak to nurse.

## 2012-06-19 ENCOUNTER — Telehealth: Payer: Self-pay | Admitting: Cardiovascular Disease

## 2012-06-19 NOTE — Telephone Encounter (Signed)
Pt has not had recent PSA done here/ jennifer was notified.

## 2012-06-19 NOTE — Telephone Encounter (Signed)
Will forward to Jodette.  Accidentally closed.

## 2012-06-19 NOTE — Telephone Encounter (Signed)
New problem    Pt requested to have psa results faxed-dr bahlstedt's office still has not recv'd fax--please fax results to: [phone]

## 2012-06-23 ENCOUNTER — Encounter (HOSPITAL_COMMUNITY)
Admission: RE | Admit: 2012-06-23 | Discharge: 2012-06-23 | Disposition: A | Discharge status: Home / Self Care | Payer: Self-pay | Source: Ambulatory Visit | Attending: Cardiovascular Disease | Admitting: Cardiovascular Disease

## 2012-06-24 ENCOUNTER — Encounter (HOSPITAL_COMMUNITY)
Admission: RE | Admit: 2012-06-24 | Discharge: 2012-06-24 | Disposition: A | Discharge status: Home / Self Care | Payer: Self-pay | Source: Ambulatory Visit | Attending: Cardiovascular Disease | Admitting: Cardiovascular Disease

## 2012-06-25 ENCOUNTER — Encounter (HOSPITAL_COMMUNITY): Payer: Self-pay

## 2012-06-30 ENCOUNTER — Encounter (HOSPITAL_COMMUNITY)
Admission: RE | Admit: 2012-06-30 | Discharge: 2012-06-30 | Disposition: A | Discharge status: Home / Self Care | Payer: Self-pay | Source: Ambulatory Visit | Attending: Cardiovascular Disease | Admitting: Cardiovascular Disease

## 2012-07-01 ENCOUNTER — Encounter (HOSPITAL_COMMUNITY): Payer: Self-pay

## 2012-07-02 ENCOUNTER — Encounter (HOSPITAL_COMMUNITY)
Admission: RE | Admit: 2012-07-02 | Discharge: 2012-07-02 | Disposition: A | Discharge status: Home / Self Care | Payer: Self-pay | Source: Ambulatory Visit | Attending: Cardiovascular Disease | Admitting: Cardiovascular Disease

## 2012-07-07 ENCOUNTER — Encounter (HOSPITAL_COMMUNITY): Payer: Self-pay

## 2012-07-08 ENCOUNTER — Encounter (HOSPITAL_COMMUNITY)
Admission: RE | Admit: 2012-07-08 | Discharge: 2012-07-08 | Disposition: A | Discharge status: Home / Self Care | Payer: Self-pay | Source: Ambulatory Visit | Attending: Cardiovascular Disease | Admitting: Cardiovascular Disease

## 2012-07-09 ENCOUNTER — Encounter (HOSPITAL_COMMUNITY)
Admission: RE | Admit: 2012-07-09 | Discharge: 2012-07-09 | Disposition: A | Discharge status: Home / Self Care | Payer: Self-pay | Source: Ambulatory Visit | Attending: Cardiovascular Disease | Admitting: Cardiovascular Disease

## 2012-07-10 ENCOUNTER — Other Ambulatory Visit: Payer: Self-pay | Admitting: *Deleted

## 2012-07-10 MED ORDER — NEBIVOLOL HCL 5 MG PO TABS: 5.0000 mg | ORAL_TABLET | Freq: Every day | ORAL | Status: DC

## 2012-07-10 NOTE — Telephone Encounter (Signed)
Fax Received. Refill Completed. Davin Archuletta Chowoe (R.M.A)   

## 2012-07-14 ENCOUNTER — Encounter (HOSPITAL_COMMUNITY)
Admission: RE | Admit: 2012-07-14 | Discharge: 2012-07-14 | Disposition: A | Discharge status: Home / Self Care | Payer: Self-pay | Source: Ambulatory Visit | Attending: Cardiovascular Disease | Admitting: Cardiovascular Disease

## 2012-07-14 ENCOUNTER — Other Ambulatory Visit (INDEPENDENT_AMBULATORY_CARE_PROVIDER_SITE_OTHER): Payer: Medicare Other

## 2012-07-14 ENCOUNTER — Ambulatory Visit (INDEPENDENT_AMBULATORY_CARE_PROVIDER_SITE_OTHER): Payer: Medicare Other | Admitting: Cardiovascular Disease

## 2012-07-14 ENCOUNTER — Encounter: Payer: Self-pay | Admitting: Cardiovascular Disease

## 2012-07-14 VITALS — BP 132/76 | HR 54 | Ht 68.0 in | Wt 186.1 lb

## 2012-07-14 DIAGNOSIS — E785 Hyperlipidemia, unspecified: Secondary | ICD-10-CM

## 2012-07-14 DIAGNOSIS — N400 Enlarged prostate without lower urinary tract symptoms: Secondary | ICD-10-CM

## 2012-07-14 DIAGNOSIS — I2510 Atherosclerotic heart disease of native coronary artery without angina pectoris: Secondary | ICD-10-CM

## 2012-07-14 LAB — BASIC METABOLIC PANEL
CO2: 26 mEq/L (ref 19–32)
Calcium: 9.6 mg/dL (ref 8.4–10.5)
Chloride: 102 mEq/L (ref 96–112)
Creatinine, Ser: 0.8 mg/dL (ref 0.4–1.5)
Glucose, Bld: 107 mg/dL — ABNORMAL HIGH (ref 70–99)

## 2012-07-14 LAB — HEPATIC FUNCTION PANEL
ALT: 21 U/L (ref 0–53)
AST: 19 U/L (ref 0–37)
Alkaline Phosphatase: 48 U/L (ref 39–117)
Total Bilirubin: 2.1 mg/dL — ABNORMAL HIGH (ref 0.3–1.2)

## 2012-07-14 LAB — LIPID PANEL
HDL: 43.8 mg/dL (ref >39.00)
Total CHOL/HDL Ratio: 3

## 2012-07-14 NOTE — Progress Notes (Signed)
Fransisca Kaufmann Date of Birth  Aug 22, 1945 [facility] [address]    [address] [place], [place]  [address] [phone]  Fax  [phone]  Problems 1. CAD 2. Dyslipidemia 3. Atrial fibrillation 4. Depression  History of Present Illness:  William Hernandez is a 67 year old gentleman with a history of coronary disease-status post coronary artery bypass grafting.  He's done very well from a cardiac standpoint. He is participating in cardiac rehabilitation and has not had any episodes of chest pain or shortness breath.  He has done well.  His lipids are well controlled.    He is participating in cardiac rehab and is doing well.  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 He doesn't have any episodes of chest pain or shortness breath when he is active.  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  He continues to have issues with anxiety.   July 14, 2012:  William Hernandez is doing well.  He had a nose bleed several weeks ago.  He is exercising regularly.  No angina. No dyspnea.  Current Outpatient Prescriptions on File Prior to Visit  Medication Sig Dispense Refill  . aspirin 325 MG tablet Take 325 mg by mouth daily.      Marland Kitchen atorvastatin (LIPITOR) 20 MG tablet TAKE 1 TABLET BY MOUTH ONCE DAILY  90 tablet  3  . fish oil-omega-3 fatty acids 1000 MG capsule Take 1 g by mouth daily.       . Flaxseed, Linseed, (FLAX SEED OIL PO) Take by mouth daily.        . irbesartan (AVAPRO) 150 MG tablet Take 0.5 tablets (75 mg total) by mouth at bedtime.  90 tablet  3  . Multiple Vitamin (MULTIVITAMIN PO) Take by mouth daily.        . nebivolol (BYSTOLIC) 5 MG tablet Take 1 tablet (5 mg total) by mouth daily.  30 tablet  6  . ranitidine (ZANTAC) 150 MG capsule Take 150 mg by mouth every evening.        . TRAZODONE HCL PO Take 150 mg by mouth at bedtime as needed.        No current facility-administered medications on file prior to visit.     Allergies  Allergen Reactions  . Lisinopril   . Penicillins     Past Medical History  Diagnosis Date  . Hyperlipidemia   . Arrhythmia     afib  . Depression   . Coronary artery disease     2003  . S/P hernia surgery     Past Surgical History  Procedure Laterality Date  . Hernia repair    . Cholecystectomy    . Cardiac catheterization      11/2001  . Coronary artery bypass graft  2000    History  Smoking status  . Never Smoker   Smokeless tobacco  . Not on file    History  Alcohol Use No    Family History  Problem Relation Age of Onset  . Heart disease Father     Reviw of Systems:  Reviewed in the HPI.  All other systems are negative.  Physical Exam: BP 132/76  Pulse 54  Ht 5\' 8"  (1.727 m)  Wt 186 lb 1.9 oz (84.423 kg)  BMI 28.31 kg/m2 The patient is alert and oriented x 3.  The mood and affect are normal.   Skin: warm and dry.  Color is normal.    HEENT:  normal  Lungs: clear   Heart: RR, no murmurs    Abdomen: soft, good BS  Extremities:  No edema  Neuro:  Non focal  ECG:  July 14, 2012:  Sinus bradycardia at 54.  Inc. RBBB Assessment / Plan:

## 2012-07-14 NOTE — Patient Instructions (Addendum)
Fasting lab work today   Your physician wants you to follow-up in: 6 months with fasting lab work make a morning appointment You will receive a reminder letter in the mail two months in advance. If you don't receive a letter, please call our office to schedule the follow-up appointment.

## 2012-07-14 NOTE — Assessment & Plan Note (Signed)
He'll continue the same medications. We'll check fasting labs today. We'll recheck him again in 6 months.

## 2012-07-14 NOTE — Assessment & Plan Note (Signed)
William Hernandez is doing very well. He's not having any episodes of chest pain or shortness of breath. Dissipating in the senior aerobics and is actually doing very well. He is in the top 3 of his age group in many categories including basketball, softball throw, and football throw.

## 2012-07-15 ENCOUNTER — Encounter (HOSPITAL_COMMUNITY): Payer: Self-pay

## 2012-07-16 ENCOUNTER — Encounter (HOSPITAL_COMMUNITY)
Admission: RE | Admit: 2012-07-16 | Discharge: 2012-07-16 | Disposition: A | Discharge status: Home / Self Care | Payer: Self-pay | Source: Ambulatory Visit | Attending: Cardiovascular Disease | Admitting: Cardiovascular Disease

## 2012-07-16 DIAGNOSIS — I2510 Atherosclerotic heart disease of native coronary artery without angina pectoris: Secondary | ICD-10-CM | POA: Insufficient documentation

## 2012-07-16 DIAGNOSIS — Z951 Presence of aortocoronary bypass graft: Secondary | ICD-10-CM | POA: Insufficient documentation

## 2012-07-16 DIAGNOSIS — E7800 Pure hypercholesterolemia, unspecified: Secondary | ICD-10-CM | POA: Insufficient documentation

## 2012-07-16 DIAGNOSIS — E669 Obesity, unspecified: Secondary | ICD-10-CM | POA: Insufficient documentation

## 2012-07-16 DIAGNOSIS — Z5189 Encounter for other specified aftercare: Secondary | ICD-10-CM | POA: Insufficient documentation

## 2012-07-16 DIAGNOSIS — I10 Essential (primary) hypertension: Secondary | ICD-10-CM | POA: Insufficient documentation

## 2012-07-16 DIAGNOSIS — Z8249 Family history of ischemic heart disease and other diseases of the circulatory system: Secondary | ICD-10-CM | POA: Insufficient documentation

## 2012-07-17 ENCOUNTER — Encounter (HOSPITAL_COMMUNITY)
Admission: RE | Admit: 2012-07-17 | Discharge: 2012-07-17 | Disposition: A | Discharge status: Home / Self Care | Payer: Self-pay | Source: Ambulatory Visit | Attending: Cardiovascular Disease | Admitting: Cardiovascular Disease

## 2012-07-21 ENCOUNTER — Encounter (HOSPITAL_COMMUNITY)
Admission: RE | Admit: 2012-07-21 | Discharge: 2012-07-21 | Disposition: A | Discharge status: Home / Self Care | Payer: Self-pay | Source: Ambulatory Visit | Attending: Cardiovascular Disease | Admitting: Cardiovascular Disease

## 2012-07-22 ENCOUNTER — Encounter (HOSPITAL_COMMUNITY)
Admission: RE | Admit: 2012-07-22 | Discharge: 2012-07-22 | Disposition: A | Discharge status: Home / Self Care | Payer: Self-pay | Source: Ambulatory Visit | Attending: Cardiovascular Disease | Admitting: Cardiovascular Disease

## 2012-07-23 ENCOUNTER — Encounter (HOSPITAL_COMMUNITY)
Admission: RE | Admit: 2012-07-23 | Discharge: 2012-07-23 | Disposition: A | Discharge status: Home / Self Care | Payer: Self-pay | Source: Ambulatory Visit | Attending: Cardiovascular Disease | Admitting: Cardiovascular Disease

## 2012-07-28 ENCOUNTER — Encounter (HOSPITAL_COMMUNITY)
Admission: RE | Admit: 2012-07-28 | Discharge: 2012-07-28 | Disposition: A | Discharge status: Home / Self Care | Payer: Self-pay | Source: Ambulatory Visit | Attending: Cardiovascular Disease | Admitting: Cardiovascular Disease

## 2012-07-29 ENCOUNTER — Encounter (HOSPITAL_COMMUNITY): Payer: Self-pay

## 2012-07-30 ENCOUNTER — Encounter (HOSPITAL_COMMUNITY)
Admission: RE | Admit: 2012-07-30 | Discharge: 2012-07-30 | Disposition: A | Discharge status: Home / Self Care | Payer: Self-pay | Source: Ambulatory Visit | Attending: Cardiovascular Disease | Admitting: Cardiovascular Disease

## 2012-08-04 ENCOUNTER — Encounter (HOSPITAL_COMMUNITY)
Admission: RE | Admit: 2012-08-04 | Discharge: 2012-08-04 | Disposition: A | Discharge status: Home / Self Care | Payer: Self-pay | Source: Ambulatory Visit | Attending: Cardiovascular Disease | Admitting: Cardiovascular Disease

## 2012-08-05 ENCOUNTER — Encounter (HOSPITAL_COMMUNITY)
Admission: RE | Admit: 2012-08-05 | Discharge: 2012-08-05 | Disposition: A | Discharge status: Home / Self Care | Payer: Self-pay | Source: Ambulatory Visit | Attending: Cardiovascular Disease | Admitting: Cardiovascular Disease

## 2012-08-06 ENCOUNTER — Encounter (HOSPITAL_COMMUNITY)
Admission: RE | Admit: 2012-08-06 | Discharge: 2012-08-06 | Disposition: A | Discharge status: Home / Self Care | Payer: Self-pay | Source: Ambulatory Visit | Attending: Cardiovascular Disease | Admitting: Cardiovascular Disease

## 2012-08-11 ENCOUNTER — Encounter (HOSPITAL_COMMUNITY)
Admission: RE | Admit: 2012-08-11 | Discharge: 2012-08-11 | Disposition: A | Discharge status: Home / Self Care | Payer: Self-pay | Source: Ambulatory Visit | Attending: Cardiovascular Disease | Admitting: Cardiovascular Disease

## 2012-08-12 ENCOUNTER — Encounter (HOSPITAL_COMMUNITY): Payer: Self-pay

## 2012-08-13 ENCOUNTER — Encounter (HOSPITAL_COMMUNITY)
Admission: RE | Admit: 2012-08-13 | Discharge: 2012-08-13 | Disposition: A | Discharge status: Home / Self Care | Payer: Self-pay | Source: Ambulatory Visit | Attending: Cardiovascular Disease | Admitting: Cardiovascular Disease

## 2012-08-18 ENCOUNTER — Encounter (HOSPITAL_COMMUNITY)
Admission: RE | Admit: 2012-08-18 | Discharge: 2012-08-18 | Disposition: A | Discharge status: Home / Self Care | Payer: Self-pay | Source: Ambulatory Visit | Attending: Cardiovascular Disease | Admitting: Cardiovascular Disease

## 2012-08-18 DIAGNOSIS — E7800 Pure hypercholesterolemia, unspecified: Secondary | ICD-10-CM | POA: Insufficient documentation

## 2012-08-18 DIAGNOSIS — I2510 Atherosclerotic heart disease of native coronary artery without angina pectoris: Secondary | ICD-10-CM | POA: Insufficient documentation

## 2012-08-18 DIAGNOSIS — Z951 Presence of aortocoronary bypass graft: Secondary | ICD-10-CM | POA: Insufficient documentation

## 2012-08-18 DIAGNOSIS — I10 Essential (primary) hypertension: Secondary | ICD-10-CM | POA: Insufficient documentation

## 2012-08-18 DIAGNOSIS — Z5189 Encounter for other specified aftercare: Secondary | ICD-10-CM | POA: Insufficient documentation

## 2012-08-18 DIAGNOSIS — E669 Obesity, unspecified: Secondary | ICD-10-CM | POA: Insufficient documentation

## 2012-08-18 DIAGNOSIS — Z8249 Family history of ischemic heart disease and other diseases of the circulatory system: Secondary | ICD-10-CM | POA: Insufficient documentation

## 2012-08-19 ENCOUNTER — Encounter (HOSPITAL_COMMUNITY): Payer: Medicare Other

## 2012-08-20 ENCOUNTER — Encounter (HOSPITAL_COMMUNITY)
Admission: RE | Admit: 2012-08-20 | Discharge: 2012-08-20 | Disposition: A | Discharge status: Home / Self Care | Payer: Self-pay | Source: Ambulatory Visit | Attending: Cardiovascular Disease | Admitting: Cardiovascular Disease

## 2012-08-25 ENCOUNTER — Encounter (HOSPITAL_COMMUNITY)
Admission: RE | Admit: 2012-08-25 | Discharge: 2012-08-25 | Disposition: A | Discharge status: Home / Self Care | Payer: Self-pay | Source: Ambulatory Visit | Attending: Cardiovascular Disease | Admitting: Cardiovascular Disease

## 2012-08-26 ENCOUNTER — Encounter (HOSPITAL_COMMUNITY): Payer: Medicare Other

## 2012-08-27 ENCOUNTER — Encounter (HOSPITAL_COMMUNITY)
Admission: RE | Admit: 2012-08-27 | Discharge: 2012-08-27 | Disposition: A | Discharge status: Home / Self Care | Payer: Self-pay | Source: Ambulatory Visit | Attending: Cardiovascular Disease | Admitting: Cardiovascular Disease

## 2012-09-01 ENCOUNTER — Encounter (HOSPITAL_COMMUNITY)
Admission: RE | Admit: 2012-09-01 | Discharge: 2012-09-01 | Disposition: A | Discharge status: Home / Self Care | Payer: Self-pay | Source: Ambulatory Visit | Attending: Cardiovascular Disease | Admitting: Cardiovascular Disease

## 2012-09-02 ENCOUNTER — Encounter (HOSPITAL_COMMUNITY): Payer: Medicare Other

## 2012-09-03 ENCOUNTER — Encounter (HOSPITAL_COMMUNITY)
Admission: RE | Admit: 2012-09-03 | Discharge: 2012-09-03 | Disposition: A | Discharge status: Home / Self Care | Payer: Self-pay | Source: Ambulatory Visit | Attending: Cardiovascular Disease | Admitting: Cardiovascular Disease

## 2012-09-08 ENCOUNTER — Encounter (HOSPITAL_COMMUNITY)
Admission: RE | Admit: 2012-09-08 | Discharge: 2012-09-08 | Disposition: A | Discharge status: Home / Self Care | Payer: Self-pay | Source: Ambulatory Visit | Attending: Cardiovascular Disease | Admitting: Cardiovascular Disease

## 2012-09-09 ENCOUNTER — Encounter (HOSPITAL_COMMUNITY): Payer: Medicare Other

## 2012-09-10 ENCOUNTER — Encounter (HOSPITAL_COMMUNITY)
Admission: RE | Admit: 2012-09-10 | Discharge: 2012-09-10 | Disposition: A | Discharge status: Home / Self Care | Payer: Self-pay | Source: Ambulatory Visit | Attending: Cardiovascular Disease | Admitting: Cardiovascular Disease

## 2012-09-15 ENCOUNTER — Encounter (HOSPITAL_COMMUNITY): Payer: Self-pay

## 2012-09-15 DIAGNOSIS — I10 Essential (primary) hypertension: Secondary | ICD-10-CM | POA: Insufficient documentation

## 2012-09-15 DIAGNOSIS — Z8249 Family history of ischemic heart disease and other diseases of the circulatory system: Secondary | ICD-10-CM | POA: Insufficient documentation

## 2012-09-15 DIAGNOSIS — E669 Obesity, unspecified: Secondary | ICD-10-CM | POA: Insufficient documentation

## 2012-09-15 DIAGNOSIS — I2510 Atherosclerotic heart disease of native coronary artery without angina pectoris: Secondary | ICD-10-CM | POA: Insufficient documentation

## 2012-09-15 DIAGNOSIS — Z5189 Encounter for other specified aftercare: Secondary | ICD-10-CM | POA: Insufficient documentation

## 2012-09-15 DIAGNOSIS — Z951 Presence of aortocoronary bypass graft: Secondary | ICD-10-CM | POA: Insufficient documentation

## 2012-09-15 DIAGNOSIS — E7800 Pure hypercholesterolemia, unspecified: Secondary | ICD-10-CM | POA: Insufficient documentation

## 2012-09-16 ENCOUNTER — Encounter (HOSPITAL_COMMUNITY): Payer: Self-pay

## 2012-09-17 ENCOUNTER — Encounter (HOSPITAL_COMMUNITY): Payer: Self-pay

## 2012-09-22 ENCOUNTER — Encounter (HOSPITAL_COMMUNITY)
Admission: RE | Admit: 2012-09-22 | Discharge: 2012-09-22 | Disposition: A | Discharge status: Home / Self Care | Payer: Self-pay | Source: Ambulatory Visit | Attending: Cardiovascular Disease | Admitting: Cardiovascular Disease

## 2012-09-23 ENCOUNTER — Encounter (HOSPITAL_COMMUNITY): Payer: Self-pay

## 2012-09-24 ENCOUNTER — Encounter (HOSPITAL_COMMUNITY)
Admission: RE | Admit: 2012-09-24 | Discharge: 2012-09-24 | Disposition: A | Discharge status: Home / Self Care | Payer: Self-pay | Source: Ambulatory Visit | Attending: Cardiovascular Disease | Admitting: Cardiovascular Disease

## 2012-09-29 ENCOUNTER — Encounter (HOSPITAL_COMMUNITY)
Admission: RE | Admit: 2012-09-29 | Discharge: 2012-09-29 | Disposition: A | Discharge status: Home / Self Care | Payer: Self-pay | Source: Ambulatory Visit | Attending: Cardiovascular Disease | Admitting: Cardiovascular Disease

## 2012-09-30 ENCOUNTER — Encounter (HOSPITAL_COMMUNITY)
Admission: RE | Admit: 2012-09-30 | Discharge: 2012-09-30 | Disposition: A | Discharge status: Home / Self Care | Payer: Self-pay | Source: Ambulatory Visit | Attending: Cardiovascular Disease | Admitting: Cardiovascular Disease

## 2012-10-01 ENCOUNTER — Encounter (HOSPITAL_COMMUNITY)
Admission: RE | Admit: 2012-10-01 | Discharge: 2012-10-01 | Disposition: A | Discharge status: Home / Self Care | Payer: Self-pay | Source: Ambulatory Visit | Attending: Cardiovascular Disease | Admitting: Cardiovascular Disease

## 2012-10-06 ENCOUNTER — Encounter (HOSPITAL_COMMUNITY)
Admission: RE | Admit: 2012-10-06 | Discharge: 2012-10-06 | Disposition: A | Discharge status: Home / Self Care | Payer: Self-pay | Source: Ambulatory Visit | Attending: Cardiovascular Disease | Admitting: Cardiovascular Disease

## 2012-10-07 ENCOUNTER — Encounter (HOSPITAL_COMMUNITY): Payer: Self-pay

## 2012-10-08 ENCOUNTER — Encounter (HOSPITAL_COMMUNITY)
Admission: RE | Admit: 2012-10-08 | Discharge: 2012-10-08 | Disposition: A | Discharge status: Home / Self Care | Payer: Self-pay | Source: Ambulatory Visit | Attending: Cardiovascular Disease | Admitting: Cardiovascular Disease

## 2012-10-13 ENCOUNTER — Encounter (HOSPITAL_COMMUNITY)
Admission: RE | Admit: 2012-10-13 | Discharge: 2012-10-13 | Disposition: A | Discharge status: Home / Self Care | Payer: Self-pay | Source: Ambulatory Visit | Attending: Cardiovascular Disease | Admitting: Cardiovascular Disease

## 2012-10-14 ENCOUNTER — Encounter (HOSPITAL_COMMUNITY): Payer: Self-pay

## 2012-10-15 ENCOUNTER — Encounter (HOSPITAL_COMMUNITY): Payer: Self-pay

## 2012-10-16 ENCOUNTER — Encounter (HOSPITAL_COMMUNITY)
Admission: RE | Admit: 2012-10-16 | Discharge: 2012-10-16 | Disposition: A | Discharge status: Home / Self Care | Payer: Self-pay | Source: Ambulatory Visit | Attending: Cardiovascular Disease | Admitting: Cardiovascular Disease

## 2012-10-16 DIAGNOSIS — I10 Essential (primary) hypertension: Secondary | ICD-10-CM | POA: Insufficient documentation

## 2012-10-16 DIAGNOSIS — I2510 Atherosclerotic heart disease of native coronary artery without angina pectoris: Secondary | ICD-10-CM | POA: Insufficient documentation

## 2012-10-16 DIAGNOSIS — E669 Obesity, unspecified: Secondary | ICD-10-CM | POA: Insufficient documentation

## 2012-10-16 DIAGNOSIS — Z951 Presence of aortocoronary bypass graft: Secondary | ICD-10-CM | POA: Insufficient documentation

## 2012-10-16 DIAGNOSIS — Z8249 Family history of ischemic heart disease and other diseases of the circulatory system: Secondary | ICD-10-CM | POA: Insufficient documentation

## 2012-10-16 DIAGNOSIS — Z5189 Encounter for other specified aftercare: Secondary | ICD-10-CM | POA: Insufficient documentation

## 2012-10-16 DIAGNOSIS — E7800 Pure hypercholesterolemia, unspecified: Secondary | ICD-10-CM | POA: Insufficient documentation

## 2012-10-19 ENCOUNTER — Encounter (HOSPITAL_COMMUNITY)
Admission: RE | Admit: 2012-10-19 | Discharge: 2012-10-19 | Disposition: A | Discharge status: Home / Self Care | Payer: Self-pay | Source: Ambulatory Visit | Attending: Cardiovascular Disease | Admitting: Cardiovascular Disease

## 2012-10-20 ENCOUNTER — Encounter (HOSPITAL_COMMUNITY)
Admission: RE | Admit: 2012-10-20 | Discharge: 2012-10-20 | Disposition: A | Discharge status: Home / Self Care | Payer: Self-pay | Source: Ambulatory Visit | Attending: Cardiovascular Disease | Admitting: Cardiovascular Disease

## 2012-10-21 ENCOUNTER — Encounter (HOSPITAL_COMMUNITY): Payer: Self-pay

## 2012-10-22 ENCOUNTER — Encounter (HOSPITAL_COMMUNITY)
Admission: RE | Admit: 2012-10-22 | Discharge: 2012-10-22 | Disposition: A | Discharge status: Home / Self Care | Payer: Self-pay | Source: Ambulatory Visit | Attending: Cardiovascular Disease | Admitting: Cardiovascular Disease

## 2012-10-27 ENCOUNTER — Encounter (HOSPITAL_COMMUNITY)
Admission: RE | Admit: 2012-10-27 | Discharge: 2012-10-27 | Disposition: A | Discharge status: Home / Self Care | Payer: Self-pay | Source: Ambulatory Visit | Attending: Cardiovascular Disease | Admitting: Cardiovascular Disease

## 2012-10-28 ENCOUNTER — Encounter (HOSPITAL_COMMUNITY)
Admission: RE | Admit: 2012-10-28 | Discharge: 2012-10-28 | Disposition: A | Discharge status: Home / Self Care | Payer: Self-pay | Source: Ambulatory Visit | Attending: Cardiovascular Disease | Admitting: Cardiovascular Disease

## 2012-10-29 ENCOUNTER — Encounter (HOSPITAL_COMMUNITY)
Admission: RE | Admit: 2012-10-29 | Discharge: 2012-10-29 | Disposition: A | Discharge status: Home / Self Care | Payer: Self-pay | Source: Ambulatory Visit | Attending: Cardiovascular Disease | Admitting: Cardiovascular Disease

## 2012-11-03 ENCOUNTER — Encounter (HOSPITAL_COMMUNITY)
Admission: RE | Admit: 2012-11-03 | Discharge: 2012-11-03 | Disposition: A | Discharge status: Home / Self Care | Payer: Self-pay | Source: Ambulatory Visit | Attending: Cardiovascular Disease | Admitting: Cardiovascular Disease

## 2012-11-04 ENCOUNTER — Encounter (HOSPITAL_COMMUNITY): Payer: Self-pay

## 2012-11-05 ENCOUNTER — Encounter (HOSPITAL_COMMUNITY)
Admission: RE | Admit: 2012-11-05 | Discharge: 2012-11-05 | Disposition: A | Discharge status: Home / Self Care | Payer: Self-pay | Source: Ambulatory Visit | Attending: Cardiovascular Disease | Admitting: Cardiovascular Disease

## 2012-11-10 ENCOUNTER — Encounter (HOSPITAL_COMMUNITY)
Admission: RE | Admit: 2012-11-10 | Discharge: 2012-11-10 | Disposition: A | Discharge status: Home / Self Care | Payer: Self-pay | Source: Ambulatory Visit | Attending: Cardiovascular Disease | Admitting: Cardiovascular Disease

## 2012-11-11 ENCOUNTER — Encounter (HOSPITAL_COMMUNITY): Payer: Self-pay

## 2012-11-12 ENCOUNTER — Encounter (HOSPITAL_COMMUNITY)
Admission: RE | Admit: 2012-11-12 | Discharge: 2012-11-12 | Disposition: A | Discharge status: Home / Self Care | Payer: Self-pay | Source: Ambulatory Visit | Attending: Cardiovascular Disease | Admitting: Cardiovascular Disease

## 2012-11-17 ENCOUNTER — Encounter (HOSPITAL_COMMUNITY): Payer: Self-pay

## 2012-11-17 DIAGNOSIS — Z8249 Family history of ischemic heart disease and other diseases of the circulatory system: Secondary | ICD-10-CM | POA: Insufficient documentation

## 2012-11-17 DIAGNOSIS — I10 Essential (primary) hypertension: Secondary | ICD-10-CM | POA: Insufficient documentation

## 2012-11-17 DIAGNOSIS — I2510 Atherosclerotic heart disease of native coronary artery without angina pectoris: Secondary | ICD-10-CM | POA: Insufficient documentation

## 2012-11-17 DIAGNOSIS — E669 Obesity, unspecified: Secondary | ICD-10-CM | POA: Insufficient documentation

## 2012-11-17 DIAGNOSIS — Z951 Presence of aortocoronary bypass graft: Secondary | ICD-10-CM | POA: Insufficient documentation

## 2012-11-17 DIAGNOSIS — Z5189 Encounter for other specified aftercare: Secondary | ICD-10-CM | POA: Insufficient documentation

## 2012-11-17 DIAGNOSIS — E7800 Pure hypercholesterolemia, unspecified: Secondary | ICD-10-CM | POA: Insufficient documentation

## 2012-11-18 ENCOUNTER — Encounter (HOSPITAL_COMMUNITY): Payer: Medicare Other

## 2012-11-19 ENCOUNTER — Other Ambulatory Visit: Payer: Self-pay | Admitting: Cardiovascular Disease

## 2012-11-19 ENCOUNTER — Encounter (HOSPITAL_COMMUNITY)
Admission: RE | Admit: 2012-11-19 | Discharge: 2012-11-19 | Disposition: A | Discharge status: Home / Self Care | Payer: Self-pay | Source: Ambulatory Visit | Attending: Cardiovascular Disease | Admitting: Cardiovascular Disease

## 2012-11-24 ENCOUNTER — Encounter (HOSPITAL_COMMUNITY)
Admission: RE | Admit: 2012-11-24 | Discharge: 2012-11-24 | Disposition: A | Discharge status: Home / Self Care | Payer: Self-pay | Source: Ambulatory Visit | Attending: Cardiovascular Disease | Admitting: Cardiovascular Disease

## 2012-11-25 ENCOUNTER — Encounter (HOSPITAL_COMMUNITY): Payer: Medicare Other

## 2012-11-26 ENCOUNTER — Encounter (HOSPITAL_COMMUNITY)
Admission: RE | Admit: 2012-11-26 | Discharge: 2012-11-26 | Disposition: A | Discharge status: Home / Self Care | Payer: Self-pay | Source: Ambulatory Visit | Attending: Cardiovascular Disease | Admitting: Cardiovascular Disease

## 2012-11-27 ENCOUNTER — Encounter (HOSPITAL_COMMUNITY)
Admission: RE | Admit: 2012-11-27 | Discharge: 2012-11-27 | Disposition: A | Discharge status: Home / Self Care | Payer: Self-pay | Source: Ambulatory Visit | Attending: Cardiovascular Disease | Admitting: Cardiovascular Disease

## 2012-12-01 ENCOUNTER — Encounter (HOSPITAL_COMMUNITY)
Admission: RE | Admit: 2012-12-01 | Discharge: 2012-12-01 | Disposition: A | Discharge status: Home / Self Care | Payer: Self-pay | Source: Ambulatory Visit | Attending: Cardiovascular Disease | Admitting: Cardiovascular Disease

## 2012-12-02 ENCOUNTER — Encounter (HOSPITAL_COMMUNITY): Payer: Medicare Other

## 2012-12-03 ENCOUNTER — Encounter (HOSPITAL_COMMUNITY): Payer: Medicare Other

## 2012-12-04 ENCOUNTER — Encounter (HOSPITAL_COMMUNITY)
Admission: RE | Admit: 2012-12-04 | Discharge: 2012-12-04 | Disposition: A | Discharge status: Home / Self Care | Payer: Self-pay | Source: Ambulatory Visit | Attending: Cardiovascular Disease | Admitting: Cardiovascular Disease

## 2012-12-08 ENCOUNTER — Encounter (HOSPITAL_COMMUNITY)
Admission: RE | Admit: 2012-12-08 | Discharge: 2012-12-08 | Disposition: A | Discharge status: Home / Self Care | Payer: Self-pay | Source: Ambulatory Visit | Attending: Cardiovascular Disease | Admitting: Cardiovascular Disease

## 2012-12-09 ENCOUNTER — Encounter (HOSPITAL_COMMUNITY): Payer: Medicare Other

## 2012-12-10 ENCOUNTER — Encounter (HOSPITAL_COMMUNITY): Payer: Medicare Other

## 2012-12-11 ENCOUNTER — Encounter (HOSPITAL_COMMUNITY)
Admission: RE | Admit: 2012-12-11 | Discharge: 2012-12-11 | Disposition: A | Discharge status: Home / Self Care | Payer: Self-pay | Source: Ambulatory Visit | Attending: Cardiovascular Disease | Admitting: Cardiovascular Disease

## 2012-12-15 ENCOUNTER — Encounter (HOSPITAL_COMMUNITY)
Admission: RE | Admit: 2012-12-15 | Discharge: 2012-12-15 | Disposition: A | Discharge status: Home / Self Care | Payer: Self-pay | Source: Ambulatory Visit | Attending: Cardiovascular Disease | Admitting: Cardiovascular Disease

## 2012-12-16 ENCOUNTER — Encounter (HOSPITAL_COMMUNITY): Payer: Medicare Other

## 2012-12-16 DIAGNOSIS — I2510 Atherosclerotic heart disease of native coronary artery without angina pectoris: Secondary | ICD-10-CM | POA: Insufficient documentation

## 2012-12-16 DIAGNOSIS — E7800 Pure hypercholesterolemia, unspecified: Secondary | ICD-10-CM | POA: Insufficient documentation

## 2012-12-16 DIAGNOSIS — Z8249 Family history of ischemic heart disease and other diseases of the circulatory system: Secondary | ICD-10-CM | POA: Insufficient documentation

## 2012-12-16 DIAGNOSIS — I10 Essential (primary) hypertension: Secondary | ICD-10-CM | POA: Insufficient documentation

## 2012-12-16 DIAGNOSIS — Z5189 Encounter for other specified aftercare: Secondary | ICD-10-CM | POA: Insufficient documentation

## 2012-12-16 DIAGNOSIS — Z951 Presence of aortocoronary bypass graft: Secondary | ICD-10-CM | POA: Insufficient documentation

## 2012-12-16 DIAGNOSIS — E669 Obesity, unspecified: Secondary | ICD-10-CM | POA: Insufficient documentation

## 2012-12-17 ENCOUNTER — Encounter (HOSPITAL_COMMUNITY)
Admission: RE | Admit: 2012-12-17 | Discharge: 2012-12-17 | Disposition: A | Discharge status: Home / Self Care | Payer: Self-pay | Source: Ambulatory Visit | Attending: Cardiovascular Disease | Admitting: Cardiovascular Disease

## 2012-12-22 ENCOUNTER — Encounter (HOSPITAL_COMMUNITY)
Admission: RE | Admit: 2012-12-22 | Discharge: 2012-12-22 | Disposition: A | Discharge status: Home / Self Care | Payer: Self-pay | Source: Ambulatory Visit | Attending: Cardiovascular Disease | Admitting: Cardiovascular Disease

## 2012-12-23 ENCOUNTER — Encounter (HOSPITAL_COMMUNITY): Admission: RE | Admit: 2012-12-23 | Payer: Medicare Other | Source: Ambulatory Visit

## 2012-12-24 ENCOUNTER — Encounter (HOSPITAL_COMMUNITY)
Admission: RE | Admit: 2012-12-24 | Discharge: 2012-12-24 | Disposition: A | Discharge status: Home / Self Care | Payer: Self-pay | Source: Ambulatory Visit | Attending: Cardiovascular Disease | Admitting: Cardiovascular Disease

## 2012-12-29 ENCOUNTER — Encounter (HOSPITAL_COMMUNITY)
Admission: RE | Admit: 2012-12-29 | Discharge: 2012-12-29 | Disposition: A | Discharge status: Home / Self Care | Payer: Self-pay | Source: Ambulatory Visit | Attending: Cardiovascular Disease | Admitting: Cardiovascular Disease

## 2012-12-30 ENCOUNTER — Encounter (HOSPITAL_COMMUNITY): Payer: Medicare Other

## 2012-12-31 ENCOUNTER — Encounter (HOSPITAL_COMMUNITY)
Admission: RE | Admit: 2012-12-31 | Discharge: 2012-12-31 | Disposition: A | Discharge status: Home / Self Care | Payer: Self-pay | Source: Ambulatory Visit | Attending: Cardiovascular Disease | Admitting: Cardiovascular Disease

## 2013-01-05 ENCOUNTER — Encounter (HOSPITAL_COMMUNITY)
Admission: RE | Admit: 2013-01-05 | Discharge: 2013-01-05 | Disposition: A | Discharge status: Home / Self Care | Payer: Self-pay | Source: Ambulatory Visit | Attending: Cardiovascular Disease | Admitting: Cardiovascular Disease

## 2013-01-06 ENCOUNTER — Encounter (HOSPITAL_COMMUNITY): Payer: Medicare Other

## 2013-01-07 ENCOUNTER — Encounter (HOSPITAL_COMMUNITY)
Admission: RE | Admit: 2013-01-07 | Discharge: 2013-01-07 | Disposition: A | Discharge status: Home / Self Care | Payer: Self-pay | Source: Ambulatory Visit | Attending: Cardiovascular Disease | Admitting: Cardiovascular Disease

## 2013-01-11 ENCOUNTER — Encounter: Payer: Self-pay | Admitting: Cardiovascular Disease

## 2013-01-11 ENCOUNTER — Other Ambulatory Visit (INDEPENDENT_AMBULATORY_CARE_PROVIDER_SITE_OTHER): Payer: Medicare Other

## 2013-01-11 ENCOUNTER — Ambulatory Visit (INDEPENDENT_AMBULATORY_CARE_PROVIDER_SITE_OTHER): Payer: Medicare Other | Admitting: Cardiovascular Disease

## 2013-01-11 VITALS — BP 150/70 | HR 50 | Ht 67.0 in | Wt 189.0 lb

## 2013-01-11 DIAGNOSIS — E785 Hyperlipidemia, unspecified: Secondary | ICD-10-CM

## 2013-01-11 DIAGNOSIS — I2510 Atherosclerotic heart disease of native coronary artery without angina pectoris: Secondary | ICD-10-CM

## 2013-01-11 LAB — LIPID PANEL
LDL Cholesterol: 58 mg/dL (ref 0–99)
Total CHOL/HDL Ratio: 3

## 2013-01-11 LAB — HEPATIC FUNCTION PANEL
AST: 15 U/L (ref 0–37)
Albumin: 4.1 g/dL (ref 3.5–5.2)
Alkaline Phosphatase: 52 U/L (ref 39–117)
Bilirubin, Direct: 0.2 mg/dL (ref 0.0–0.3)
Total Bilirubin: 2.4 mg/dL — ABNORMAL HIGH (ref 0.3–1.2)

## 2013-01-11 LAB — BASIC METABOLIC PANEL
BUN: 15 mg/dL (ref 6–23)
CO2: 27 mEq/L (ref 19–32)
Calcium: 9 mg/dL (ref 8.4–10.5)
Chloride: 103 mEq/L (ref 96–112)
Creatinine, Ser: 0.9 mg/dL (ref 0.4–1.5)
Glucose, Bld: 110 mg/dL — ABNORMAL HIGH (ref 70–99)

## 2013-01-11 NOTE — Patient Instructions (Signed)
Your physician wants you to follow-up in: 6 months with Dr Elease Hashimoto. (April 2015).  You will receive a reminder letter in the mail two months in advance. If you don't receive a letter, please call our office to schedule the follow-up appointment.   Your physician recommends that you return for a FASTING lipid profile /liver profile/BMET in 6 months when you see Dr Elease Hashimoto.

## 2013-01-11 NOTE — Assessment & Plan Note (Signed)
William Hernandez is doing well.  No CP.  Very active.   Will check lipids today.  I'll see him in 6 months.  He remains active and free of CP

## 2013-01-11 NOTE — Progress Notes (Signed)
William Hernandez  Date of Birth  [birth year] [facility] [address]    [address] [place], [place]  [address] [phone]  Fax  [phone]  Problems 1. CAD 2. Dyslipidemia 3. Atrial fibrillation 4. Depression  History of Present Illness:  William Hernandez is a 67 year old gentleman with a history of coronary disease-status post coronary artery bypass grafting.  He's done very well from a cardiac standpoint. He is participating in cardiac rehabilitation and has not had any episodes of chest pain or shortness breath.  He has done well.  His lipids are well controlled.    He is participating in cardiac rehab and is doing well.  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 He doesn't have any episodes of chest pain or shortness breath when he is active.  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  He continues to have issues with anxiety.   July 14, 2012:  William Hernandez is doing well.  He had a nose bleed several weeks ago.  He is exercising regularly.  No angina. No dyspnea.  Oct. 27, 2014:  He is doing well.   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  2 very hard games of 3 on 3 , half court game.  No pain.    Active. Exercises.   Increased his Trazadone to help with some additional depression.   Current Outpatient Prescriptions on File Prior to Visit  Medication Sig Dispense Refill  . atorvastatin (LIPITOR) 20 MG tablet TAKE 1 TABLET BY MOUTH ONCE DAILY  90 tablet  1  . fish oil-omega-3 fatty acids 1000 MG capsule Take 1 g by mouth daily.       . Flaxseed, Linseed, (FLAX SEED OIL PO) Take by mouth daily.        . irbesartan (AVAPRO) 150 MG tablet Take 0.5 tablets (75 mg total) by mouth at bedtime.  90 tablet  3  . Multiple Vitamin (MULTIVITAMIN PO) Take by mouth daily.        . nebivolol (BYSTOLIC) 5 MG tablet Take 1 tablet (5 mg total) by mouth daily.  30 tablet  6  . ranitidine (ZANTAC) 150 MG  capsule Take 150 mg by mouth every evening.        . TRAZODONE HCL PO Take 200 mg by mouth at bedtime as needed.        No current facility-administered medications on file prior to visit.    Allergies  Allergen Reactions  . Lisinopril   . Penicillins     Past Medical History  Diagnosis Date  . Hyperlipidemia   . Arrhythmia     afib  . Depression   . Coronary artery disease     2003  . S/P hernia surgery     Past Surgical History  Procedure Laterality Date  . Hernia repair    . Cholecystectomy    . Cardiac catheterization      11/2001  . Coronary artery bypass graft  2000    History  Smoking status  . Never Smoker   Smokeless tobacco  . Not on file    History  Alcohol Use No    Family History  Problem Relation Age of Onset  . Heart disease Father     Reviw of Systems:  Reviewed in the HPI.  All other systems are negative.  Physical Exam: BP 150/70  Pulse 50  Ht 5\' 7"  (  1.702 m)  Wt 189 lb (85.73 kg)  BMI 29.59 kg/m2 The patient is alert and oriented x 3.  The mood and affect are normal.   Skin: warm and dry.  Color is normal.    HEENT:   normal  Lungs: clear   Heart: RR, no murmurs    Abdomen: soft, good BS  Extremities:  No edema  Neuro:  Non focal  ECG:  Oct. 27, , 2014:  Sinus bradycardia at 50.    Assessment / Plan:

## 2013-01-12 ENCOUNTER — Encounter (HOSPITAL_COMMUNITY)
Admission: RE | Admit: 2013-01-12 | Discharge: 2013-01-12 | Disposition: A | Discharge status: Home / Self Care | Payer: Self-pay | Source: Ambulatory Visit | Attending: Cardiovascular Disease | Admitting: Cardiovascular Disease

## 2013-01-13 ENCOUNTER — Encounter (HOSPITAL_COMMUNITY): Admission: RE | Admit: 2013-01-13 | Payer: Medicare Other | Source: Ambulatory Visit

## 2013-01-14 ENCOUNTER — Encounter (HOSPITAL_COMMUNITY)
Admission: RE | Admit: 2013-01-14 | Discharge: 2013-01-14 | Disposition: A | Discharge status: Home / Self Care | Payer: Self-pay | Source: Ambulatory Visit | Attending: Cardiovascular Disease | Admitting: Cardiovascular Disease

## 2013-01-19 ENCOUNTER — Encounter (HOSPITAL_COMMUNITY)
Admission: RE | Admit: 2013-01-19 | Discharge: 2013-01-19 | Disposition: A | Discharge status: Home / Self Care | Payer: Self-pay | Source: Ambulatory Visit | Attending: Cardiovascular Disease | Admitting: Cardiovascular Disease

## 2013-01-19 DIAGNOSIS — Z951 Presence of aortocoronary bypass graft: Secondary | ICD-10-CM | POA: Insufficient documentation

## 2013-01-19 DIAGNOSIS — I10 Essential (primary) hypertension: Secondary | ICD-10-CM | POA: Insufficient documentation

## 2013-01-19 DIAGNOSIS — E669 Obesity, unspecified: Secondary | ICD-10-CM | POA: Insufficient documentation

## 2013-01-19 DIAGNOSIS — I2510 Atherosclerotic heart disease of native coronary artery without angina pectoris: Secondary | ICD-10-CM | POA: Insufficient documentation

## 2013-01-19 DIAGNOSIS — E7800 Pure hypercholesterolemia, unspecified: Secondary | ICD-10-CM | POA: Insufficient documentation

## 2013-01-19 DIAGNOSIS — Z8249 Family history of ischemic heart disease and other diseases of the circulatory system: Secondary | ICD-10-CM | POA: Insufficient documentation

## 2013-01-19 DIAGNOSIS — Z5189 Encounter for other specified aftercare: Secondary | ICD-10-CM | POA: Insufficient documentation

## 2013-01-20 ENCOUNTER — Encounter (HOSPITAL_COMMUNITY): Payer: Medicare Other

## 2013-01-21 ENCOUNTER — Encounter (HOSPITAL_COMMUNITY)
Admission: RE | Admit: 2013-01-21 | Discharge: 2013-01-21 | Disposition: A | Discharge status: Home / Self Care | Payer: Self-pay | Source: Ambulatory Visit | Attending: Cardiovascular Disease | Admitting: Cardiovascular Disease

## 2013-01-26 ENCOUNTER — Encounter (HOSPITAL_COMMUNITY): Payer: Medicare Other

## 2013-01-27 ENCOUNTER — Encounter (HOSPITAL_COMMUNITY): Payer: Medicare Other

## 2013-01-28 ENCOUNTER — Encounter (HOSPITAL_COMMUNITY)
Admission: RE | Admit: 2013-01-28 | Discharge: 2013-01-28 | Disposition: A | Discharge status: Home / Self Care | Payer: Self-pay | Source: Ambulatory Visit | Attending: Cardiovascular Disease | Admitting: Cardiovascular Disease

## 2013-02-02 ENCOUNTER — Encounter (HOSPITAL_COMMUNITY)
Admission: RE | Admit: 2013-02-02 | Discharge: 2013-02-02 | Disposition: A | Discharge status: Home / Self Care | Payer: Self-pay | Source: Ambulatory Visit | Attending: Cardiovascular Disease | Admitting: Cardiovascular Disease

## 2013-02-03 ENCOUNTER — Encounter (HOSPITAL_COMMUNITY): Payer: Medicare Other

## 2013-02-04 ENCOUNTER — Encounter (HOSPITAL_COMMUNITY)
Admission: RE | Admit: 2013-02-04 | Discharge: 2013-02-04 | Disposition: A | Discharge status: Home / Self Care | Payer: Self-pay | Source: Ambulatory Visit | Attending: Cardiovascular Disease | Admitting: Cardiovascular Disease

## 2013-02-09 ENCOUNTER — Encounter (HOSPITAL_COMMUNITY)
Admission: RE | Admit: 2013-02-09 | Discharge: 2013-02-09 | Disposition: A | Discharge status: Home / Self Care | Payer: Self-pay | Source: Ambulatory Visit | Attending: Cardiovascular Disease | Admitting: Cardiovascular Disease

## 2013-02-10 ENCOUNTER — Encounter (HOSPITAL_COMMUNITY): Payer: Medicare Other

## 2013-02-16 ENCOUNTER — Encounter (HOSPITAL_COMMUNITY)
Admission: RE | Admit: 2013-02-16 | Discharge: 2013-02-16 | Disposition: A | Discharge status: Home / Self Care | Payer: Self-pay | Source: Ambulatory Visit | Attending: Cardiovascular Disease | Admitting: Cardiovascular Disease

## 2013-02-16 DIAGNOSIS — E7800 Pure hypercholesterolemia, unspecified: Secondary | ICD-10-CM | POA: Insufficient documentation

## 2013-02-16 DIAGNOSIS — Z5189 Encounter for other specified aftercare: Secondary | ICD-10-CM | POA: Insufficient documentation

## 2013-02-16 DIAGNOSIS — Z8249 Family history of ischemic heart disease and other diseases of the circulatory system: Secondary | ICD-10-CM | POA: Insufficient documentation

## 2013-02-16 DIAGNOSIS — I10 Essential (primary) hypertension: Secondary | ICD-10-CM | POA: Insufficient documentation

## 2013-02-16 DIAGNOSIS — E669 Obesity, unspecified: Secondary | ICD-10-CM | POA: Insufficient documentation

## 2013-02-16 DIAGNOSIS — Z951 Presence of aortocoronary bypass graft: Secondary | ICD-10-CM | POA: Insufficient documentation

## 2013-02-16 DIAGNOSIS — I2510 Atherosclerotic heart disease of native coronary artery without angina pectoris: Secondary | ICD-10-CM | POA: Insufficient documentation

## 2013-02-17 ENCOUNTER — Encounter (HOSPITAL_COMMUNITY): Payer: Self-pay

## 2013-02-18 ENCOUNTER — Encounter (HOSPITAL_COMMUNITY)
Admission: RE | Admit: 2013-02-18 | Discharge: 2013-02-18 | Disposition: A | Discharge status: Home / Self Care | Payer: Self-pay | Source: Ambulatory Visit | Attending: Cardiovascular Disease | Admitting: Cardiovascular Disease

## 2013-02-23 ENCOUNTER — Encounter (HOSPITAL_COMMUNITY): Payer: Self-pay

## 2013-02-24 ENCOUNTER — Encounter (HOSPITAL_COMMUNITY): Payer: Self-pay

## 2013-02-25 ENCOUNTER — Encounter (HOSPITAL_COMMUNITY): Payer: Self-pay

## 2013-03-01 ENCOUNTER — Other Ambulatory Visit: Payer: Self-pay | Admitting: Cardiovascular Disease

## 2013-03-02 ENCOUNTER — Encounter (HOSPITAL_COMMUNITY)
Admission: RE | Admit: 2013-03-02 | Discharge: 2013-03-02 | Disposition: A | Discharge status: Home / Self Care | Payer: Self-pay | Source: Ambulatory Visit | Attending: Cardiovascular Disease | Admitting: Cardiovascular Disease

## 2013-03-03 ENCOUNTER — Encounter (HOSPITAL_COMMUNITY): Payer: Self-pay

## 2013-03-04 ENCOUNTER — Encounter (HOSPITAL_COMMUNITY)
Admission: RE | Admit: 2013-03-04 | Discharge: 2013-03-04 | Disposition: A | Discharge status: Home / Self Care | Payer: Self-pay | Source: Ambulatory Visit | Attending: Cardiovascular Disease | Admitting: Cardiovascular Disease

## 2013-03-08 ENCOUNTER — Encounter (HOSPITAL_COMMUNITY)
Admission: RE | Admit: 2013-03-08 | Discharge: 2013-03-08 | Disposition: A | Discharge status: Home / Self Care | Payer: Self-pay | Source: Ambulatory Visit | Attending: Cardiovascular Disease | Admitting: Cardiovascular Disease

## 2013-03-09 ENCOUNTER — Encounter (HOSPITAL_COMMUNITY)
Admission: RE | Admit: 2013-03-09 | Discharge: 2013-03-09 | Disposition: A | Discharge status: Home / Self Care | Payer: Self-pay | Source: Ambulatory Visit | Attending: Cardiovascular Disease | Admitting: Cardiovascular Disease

## 2013-03-10 ENCOUNTER — Encounter (HOSPITAL_COMMUNITY): Payer: Self-pay

## 2013-03-16 ENCOUNTER — Encounter (HOSPITAL_COMMUNITY)
Admission: RE | Admit: 2013-03-16 | Discharge: 2013-03-16 | Disposition: A | Discharge status: Home / Self Care | Payer: Self-pay | Source: Ambulatory Visit | Attending: Cardiovascular Disease | Admitting: Cardiovascular Disease

## 2013-03-17 ENCOUNTER — Encounter (HOSPITAL_COMMUNITY): Payer: Self-pay

## 2013-03-19 ENCOUNTER — Encounter (HOSPITAL_COMMUNITY)
Admission: RE | Admit: 2013-03-19 | Discharge: 2013-03-19 | Disposition: A | Discharge status: Home / Self Care | Payer: Self-pay | Source: Ambulatory Visit | Attending: Cardiovascular Disease | Admitting: Cardiovascular Disease

## 2013-03-19 DIAGNOSIS — I10 Essential (primary) hypertension: Secondary | ICD-10-CM | POA: Insufficient documentation

## 2013-03-19 DIAGNOSIS — I2510 Atherosclerotic heart disease of native coronary artery without angina pectoris: Secondary | ICD-10-CM | POA: Insufficient documentation

## 2013-03-19 DIAGNOSIS — Z951 Presence of aortocoronary bypass graft: Secondary | ICD-10-CM | POA: Insufficient documentation

## 2013-03-19 DIAGNOSIS — Z5189 Encounter for other specified aftercare: Secondary | ICD-10-CM | POA: Insufficient documentation

## 2013-03-19 DIAGNOSIS — E669 Obesity, unspecified: Secondary | ICD-10-CM | POA: Insufficient documentation

## 2013-03-19 DIAGNOSIS — Z8249 Family history of ischemic heart disease and other diseases of the circulatory system: Secondary | ICD-10-CM | POA: Insufficient documentation

## 2013-03-19 DIAGNOSIS — E7800 Pure hypercholesterolemia, unspecified: Secondary | ICD-10-CM | POA: Insufficient documentation

## 2013-03-23 ENCOUNTER — Encounter (HOSPITAL_COMMUNITY)
Admission: RE | Admit: 2013-03-23 | Discharge: 2013-03-23 | Disposition: A | Discharge status: Home / Self Care | Payer: Self-pay | Source: Ambulatory Visit | Attending: Cardiovascular Disease | Admitting: Cardiovascular Disease

## 2013-03-24 ENCOUNTER — Encounter (HOSPITAL_COMMUNITY): Payer: Self-pay

## 2013-03-25 ENCOUNTER — Encounter (HOSPITAL_COMMUNITY): Payer: Self-pay

## 2013-03-30 ENCOUNTER — Encounter (HOSPITAL_COMMUNITY)
Admission: RE | Admit: 2013-03-30 | Discharge: 2013-03-30 | Disposition: A | Discharge status: Home / Self Care | Payer: Self-pay | Source: Ambulatory Visit | Attending: Cardiovascular Disease | Admitting: Cardiovascular Disease

## 2013-03-31 ENCOUNTER — Encounter (HOSPITAL_COMMUNITY): Payer: Self-pay

## 2013-04-01 ENCOUNTER — Encounter (HOSPITAL_COMMUNITY)
Admission: RE | Admit: 2013-04-01 | Discharge: 2013-04-01 | Disposition: A | Discharge status: Home / Self Care | Payer: Self-pay | Source: Ambulatory Visit | Attending: Cardiovascular Disease | Admitting: Cardiovascular Disease

## 2013-04-06 ENCOUNTER — Encounter (HOSPITAL_COMMUNITY): Payer: Self-pay

## 2013-04-07 ENCOUNTER — Encounter (HOSPITAL_COMMUNITY): Payer: Self-pay

## 2013-04-08 ENCOUNTER — Encounter (HOSPITAL_COMMUNITY): Payer: Self-pay

## 2013-04-13 ENCOUNTER — Encounter (HOSPITAL_COMMUNITY): Payer: Self-pay

## 2013-04-14 ENCOUNTER — Encounter (HOSPITAL_COMMUNITY): Payer: Self-pay

## 2013-04-15 ENCOUNTER — Encounter (HOSPITAL_COMMUNITY)
Admission: RE | Admit: 2013-04-15 | Discharge: 2013-04-15 | Disposition: A | Discharge status: Home / Self Care | Payer: Self-pay | Source: Ambulatory Visit | Attending: Cardiovascular Disease | Admitting: Cardiovascular Disease

## 2013-04-16 ENCOUNTER — Encounter (HOSPITAL_COMMUNITY)
Admission: RE | Admit: 2013-04-16 | Discharge: 2013-04-16 | Disposition: A | Discharge status: Home / Self Care | Payer: Self-pay | Source: Ambulatory Visit | Attending: Cardiovascular Disease | Admitting: Cardiovascular Disease

## 2013-04-20 ENCOUNTER — Encounter (HOSPITAL_COMMUNITY)
Admission: RE | Admit: 2013-04-20 | Discharge: 2013-04-20 | Disposition: A | Discharge status: Home / Self Care | Payer: Self-pay | Source: Ambulatory Visit | Attending: Cardiovascular Disease | Admitting: Cardiovascular Disease

## 2013-04-20 DIAGNOSIS — Z951 Presence of aortocoronary bypass graft: Secondary | ICD-10-CM | POA: Insufficient documentation

## 2013-04-20 DIAGNOSIS — I10 Essential (primary) hypertension: Secondary | ICD-10-CM | POA: Insufficient documentation

## 2013-04-20 DIAGNOSIS — E7800 Pure hypercholesterolemia, unspecified: Secondary | ICD-10-CM | POA: Insufficient documentation

## 2013-04-20 DIAGNOSIS — Z8249 Family history of ischemic heart disease and other diseases of the circulatory system: Secondary | ICD-10-CM | POA: Insufficient documentation

## 2013-04-20 DIAGNOSIS — I2510 Atherosclerotic heart disease of native coronary artery without angina pectoris: Secondary | ICD-10-CM | POA: Insufficient documentation

## 2013-04-20 DIAGNOSIS — Z5189 Encounter for other specified aftercare: Secondary | ICD-10-CM | POA: Insufficient documentation

## 2013-04-20 DIAGNOSIS — E669 Obesity, unspecified: Secondary | ICD-10-CM | POA: Insufficient documentation

## 2013-04-21 ENCOUNTER — Encounter (HOSPITAL_COMMUNITY): Payer: Self-pay

## 2013-04-22 ENCOUNTER — Encounter (HOSPITAL_COMMUNITY)
Admission: RE | Admit: 2013-04-22 | Discharge: 2013-04-22 | Disposition: A | Discharge status: Home / Self Care | Payer: Self-pay | Source: Ambulatory Visit | Attending: Cardiovascular Disease | Admitting: Cardiovascular Disease

## 2013-04-27 ENCOUNTER — Encounter (HOSPITAL_COMMUNITY)
Admission: RE | Admit: 2013-04-27 | Discharge: 2013-04-27 | Disposition: A | Discharge status: Home / Self Care | Payer: Self-pay | Source: Ambulatory Visit | Attending: Cardiovascular Disease | Admitting: Cardiovascular Disease

## 2013-04-28 ENCOUNTER — Encounter (HOSPITAL_COMMUNITY): Payer: Self-pay

## 2013-04-29 ENCOUNTER — Encounter (HOSPITAL_COMMUNITY)
Admission: RE | Admit: 2013-04-29 | Discharge: 2013-04-29 | Disposition: A | Discharge status: Home / Self Care | Payer: Self-pay | Source: Ambulatory Visit | Attending: Cardiovascular Disease | Admitting: Cardiovascular Disease

## 2013-05-04 ENCOUNTER — Encounter (HOSPITAL_COMMUNITY): Payer: Self-pay

## 2013-05-05 ENCOUNTER — Encounter (HOSPITAL_COMMUNITY): Payer: Self-pay

## 2013-05-06 ENCOUNTER — Encounter (HOSPITAL_COMMUNITY): Payer: Self-pay

## 2013-05-11 ENCOUNTER — Encounter (HOSPITAL_COMMUNITY): Payer: Self-pay

## 2013-05-12 ENCOUNTER — Encounter (HOSPITAL_COMMUNITY): Payer: Self-pay

## 2013-05-13 ENCOUNTER — Encounter (HOSPITAL_COMMUNITY): Payer: Self-pay

## 2013-05-18 ENCOUNTER — Encounter (HOSPITAL_COMMUNITY)
Admission: RE | Admit: 2013-05-18 | Discharge: 2013-05-18 | Disposition: A | Discharge status: Home / Self Care | Payer: Self-pay | Source: Ambulatory Visit | Attending: Cardiovascular Disease | Admitting: Cardiovascular Disease

## 2013-05-18 DIAGNOSIS — I10 Essential (primary) hypertension: Secondary | ICD-10-CM | POA: Insufficient documentation

## 2013-05-18 DIAGNOSIS — E669 Obesity, unspecified: Secondary | ICD-10-CM | POA: Insufficient documentation

## 2013-05-18 DIAGNOSIS — E7800 Pure hypercholesterolemia, unspecified: Secondary | ICD-10-CM | POA: Insufficient documentation

## 2013-05-18 DIAGNOSIS — Z8249 Family history of ischemic heart disease and other diseases of the circulatory system: Secondary | ICD-10-CM | POA: Insufficient documentation

## 2013-05-18 DIAGNOSIS — Z5189 Encounter for other specified aftercare: Secondary | ICD-10-CM | POA: Insufficient documentation

## 2013-05-18 DIAGNOSIS — I2510 Atherosclerotic heart disease of native coronary artery without angina pectoris: Secondary | ICD-10-CM | POA: Insufficient documentation

## 2013-05-18 DIAGNOSIS — Z951 Presence of aortocoronary bypass graft: Secondary | ICD-10-CM | POA: Insufficient documentation

## 2013-05-19 ENCOUNTER — Encounter (HOSPITAL_COMMUNITY): Payer: Self-pay

## 2013-05-20 ENCOUNTER — Encounter (HOSPITAL_COMMUNITY)
Admission: RE | Admit: 2013-05-20 | Discharge: 2013-05-20 | Disposition: A | Discharge status: Home / Self Care | Payer: Self-pay | Source: Ambulatory Visit | Attending: Cardiovascular Disease | Admitting: Cardiovascular Disease

## 2013-05-21 ENCOUNTER — Other Ambulatory Visit: Payer: Self-pay | Admitting: Cardiovascular Disease

## 2013-05-25 ENCOUNTER — Encounter (HOSPITAL_COMMUNITY)
Admission: RE | Admit: 2013-05-25 | Discharge: 2013-05-25 | Disposition: A | Discharge status: Home / Self Care | Payer: Self-pay | Source: Ambulatory Visit | Attending: Cardiovascular Disease | Admitting: Cardiovascular Disease

## 2013-05-26 ENCOUNTER — Encounter (HOSPITAL_COMMUNITY): Payer: Self-pay

## 2013-05-27 ENCOUNTER — Encounter (HOSPITAL_COMMUNITY): Payer: Self-pay

## 2013-06-01 ENCOUNTER — Encounter (HOSPITAL_COMMUNITY)
Admission: RE | Admit: 2013-06-01 | Discharge: 2013-06-01 | Disposition: A | Discharge status: Home / Self Care | Payer: Self-pay | Source: Ambulatory Visit | Attending: Cardiovascular Disease | Admitting: Cardiovascular Disease

## 2013-06-02 ENCOUNTER — Encounter (HOSPITAL_COMMUNITY): Payer: Self-pay

## 2013-06-03 ENCOUNTER — Encounter (HOSPITAL_COMMUNITY): Payer: Self-pay

## 2013-06-08 ENCOUNTER — Encounter (HOSPITAL_COMMUNITY)
Admission: RE | Admit: 2013-06-08 | Discharge: 2013-06-08 | Disposition: A | Discharge status: Home / Self Care | Payer: Self-pay | Source: Ambulatory Visit | Attending: Cardiovascular Disease | Admitting: Cardiovascular Disease

## 2013-06-09 ENCOUNTER — Encounter (HOSPITAL_COMMUNITY): Payer: Self-pay

## 2013-06-10 ENCOUNTER — Encounter (HOSPITAL_COMMUNITY): Payer: Self-pay

## 2013-06-15 ENCOUNTER — Encounter (HOSPITAL_COMMUNITY)
Admission: RE | Admit: 2013-06-15 | Discharge: 2013-06-15 | Disposition: A | Discharge status: Home / Self Care | Payer: Self-pay | Source: Ambulatory Visit | Attending: Cardiovascular Disease | Admitting: Cardiovascular Disease

## 2013-06-16 ENCOUNTER — Encounter (HOSPITAL_COMMUNITY): Payer: Medicare Other

## 2013-06-16 DIAGNOSIS — I10 Essential (primary) hypertension: Secondary | ICD-10-CM | POA: Insufficient documentation

## 2013-06-16 DIAGNOSIS — E7800 Pure hypercholesterolemia, unspecified: Secondary | ICD-10-CM | POA: Insufficient documentation

## 2013-06-16 DIAGNOSIS — I2510 Atherosclerotic heart disease of native coronary artery without angina pectoris: Secondary | ICD-10-CM | POA: Insufficient documentation

## 2013-06-16 DIAGNOSIS — Z951 Presence of aortocoronary bypass graft: Secondary | ICD-10-CM | POA: Insufficient documentation

## 2013-06-16 DIAGNOSIS — E669 Obesity, unspecified: Secondary | ICD-10-CM | POA: Insufficient documentation

## 2013-06-16 DIAGNOSIS — Z5189 Encounter for other specified aftercare: Secondary | ICD-10-CM | POA: Insufficient documentation

## 2013-06-16 DIAGNOSIS — Z8249 Family history of ischemic heart disease and other diseases of the circulatory system: Secondary | ICD-10-CM | POA: Insufficient documentation

## 2013-06-17 ENCOUNTER — Encounter (HOSPITAL_COMMUNITY): Payer: Self-pay

## 2013-06-22 ENCOUNTER — Encounter (HOSPITAL_COMMUNITY): Payer: Self-pay

## 2013-06-23 ENCOUNTER — Encounter (HOSPITAL_COMMUNITY): Payer: Self-pay

## 2013-06-24 ENCOUNTER — Encounter (HOSPITAL_COMMUNITY)
Admission: RE | Admit: 2013-06-24 | Discharge: 2013-06-24 | Disposition: A | Discharge status: Home / Self Care | Payer: Self-pay | Source: Ambulatory Visit | Attending: Cardiovascular Disease | Admitting: Cardiovascular Disease

## 2013-06-28 ENCOUNTER — Encounter: Payer: Self-pay | Admitting: Family Medicine

## 2013-06-29 ENCOUNTER — Encounter (HOSPITAL_COMMUNITY): Payer: Self-pay

## 2013-06-30 ENCOUNTER — Other Ambulatory Visit: Payer: Self-pay | Admitting: Cardiovascular Disease

## 2013-06-30 ENCOUNTER — Encounter (HOSPITAL_COMMUNITY): Payer: Self-pay

## 2013-07-01 ENCOUNTER — Encounter (HOSPITAL_COMMUNITY)
Admission: RE | Admit: 2013-07-01 | Discharge: 2013-07-01 | Disposition: A | Discharge status: Home / Self Care | Payer: Self-pay | Source: Ambulatory Visit | Attending: Cardiovascular Disease | Admitting: Cardiovascular Disease

## 2013-07-06 ENCOUNTER — Ambulatory Visit (INDEPENDENT_AMBULATORY_CARE_PROVIDER_SITE_OTHER): Payer: Self-pay | Admitting: Family Medicine

## 2013-07-06 ENCOUNTER — Encounter (HOSPITAL_COMMUNITY): Payer: Self-pay

## 2013-07-06 DIAGNOSIS — R69 Illness, unspecified: Secondary | ICD-10-CM

## 2013-07-07 ENCOUNTER — Encounter (HOSPITAL_COMMUNITY): Payer: Self-pay

## 2013-07-07 NOTE — Progress Notes (Signed)
   Subjective:    Patient ID: William Hernandez, male    DOB: 12/30/1945, 68 y.o.   MRN: [id number]  HPI    Review of Systems     Objective:   Physical Exam        Assessment & Plan:  Error. Patient was seen in a different encounter on the same day

## 2013-07-08 ENCOUNTER — Encounter (HOSPITAL_COMMUNITY): Payer: Self-pay

## 2013-07-12 ENCOUNTER — Ambulatory Visit: Payer: Medicare Other | Admitting: Cardiovascular Disease

## 2013-07-12 ENCOUNTER — Ambulatory Visit (INDEPENDENT_AMBULATORY_CARE_PROVIDER_SITE_OTHER): Payer: Medicare Other | Admitting: *Deleted

## 2013-07-12 DIAGNOSIS — I2510 Atherosclerotic heart disease of native coronary artery without angina pectoris: Secondary | ICD-10-CM

## 2013-07-12 DIAGNOSIS — N400 Enlarged prostate without lower urinary tract symptoms: Secondary | ICD-10-CM

## 2013-07-12 LAB — LIPID PANEL
CHOLESTEROL: 107 mg/dL (ref 0–200)
HDL: 32.2 mg/dL — ABNORMAL LOW (ref >39.00)
LDL Cholesterol: 62 mg/dL (ref 0–99)
TRIGLYCERIDES: 66 mg/dL (ref 0.0–149.0)
Total CHOL/HDL Ratio: 3
VLDL: 13.2 mg/dL (ref 0.0–40.0)

## 2013-07-12 LAB — BASIC METABOLIC PANEL
BUN: 12 mg/dL (ref 6–23)
CHLORIDE: 104 meq/L (ref 96–112)
CO2: 28 mEq/L (ref 19–32)
Calcium: 9.2 mg/dL (ref 8.4–10.5)
Creatinine, Ser: 0.8 mg/dL (ref 0.4–1.5)
GFR: 103.27 mL/min (ref >60.00)
Glucose, Bld: 114 mg/dL — ABNORMAL HIGH (ref 70–99)
POTASSIUM: 4.1 meq/L (ref 3.5–5.1)
SODIUM: 138 meq/L (ref 135–145)

## 2013-07-12 LAB — HEPATIC FUNCTION PANEL
ALBUMIN: 3.8 g/dL (ref 3.5–5.2)
ALK PHOS: 43 U/L (ref 39–117)
ALT: 22 U/L (ref 0–53)
AST: 20 U/L (ref 0–37)
Bilirubin, Direct: 0.2 mg/dL (ref 0.0–0.3)
TOTAL PROTEIN: 6.7 g/dL (ref 6.0–8.3)
Total Bilirubin: 1.4 mg/dL — ABNORMAL HIGH (ref 0.3–1.2)

## 2013-07-12 LAB — PSA: PSA: 1.8 ng/mL (ref 0.10–4.00)

## 2013-07-13 ENCOUNTER — Encounter (HOSPITAL_COMMUNITY)
Admission: RE | Admit: 2013-07-13 | Discharge: 2013-07-13 | Disposition: A | Discharge status: Home / Self Care | Payer: Self-pay | Source: Ambulatory Visit | Attending: Cardiovascular Disease | Admitting: Cardiovascular Disease

## 2013-07-14 ENCOUNTER — Ambulatory Visit (INDEPENDENT_AMBULATORY_CARE_PROVIDER_SITE_OTHER): Payer: Medicare Other | Admitting: Cardiovascular Disease

## 2013-07-14 ENCOUNTER — Encounter (HOSPITAL_COMMUNITY): Payer: Self-pay

## 2013-07-14 ENCOUNTER — Encounter: Payer: Self-pay | Admitting: Cardiovascular Disease

## 2013-07-14 VITALS — BP 143/76 | HR 65 | Ht 67.0 in | Wt 186.8 lb

## 2013-07-14 DIAGNOSIS — I2510 Atherosclerotic heart disease of native coronary artery without angina pectoris: Secondary | ICD-10-CM

## 2013-07-14 DIAGNOSIS — E785 Hyperlipidemia, unspecified: Secondary | ICD-10-CM

## 2013-07-14 MED ORDER — ALBUTEROL SULFATE HFA 108 (90 BASE) MCG/ACT IN AERS: 2.0000 | INHALATION_SPRAY | Freq: Four times a day (QID) | RESPIRATORY_TRACT | Status: DC | PRN

## 2013-07-14 NOTE — Progress Notes (Signed)
William KaufmannLawrence D Hernandez  Date of Birth  12/13/1945 [facility] [address]    [address] [place], [place]  [address] [phone]  Fax  [phone]  Problems 1. CAD 2. Dyslipidemia 3. Atrial fibrillation 4. Depression  History of Present Illness:  William NajjarLarry is a 68 year old gentleman with a history of coronary disease-status post coronary artery bypass grafting.  He's done very well from a cardiac standpoint. He is participating in cardiac rehabilitation and has not had any episodes of chest pain or shortness breath.  He has done well.  His lipids are well controlled.    He is participating in cardiac rehab and is doing well.  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 He doesn't have any episodes of chest pain or shortness breath when he is active.  He also competed in the [employer] basketball shooting contest.  He continues to have issues with anxiety.   July 14, 2012:  William NajjarLarry is doing well.  He had a nose bleed several weeks ago.  He is exercising regularly.  No angina. No dyspnea.  Oct. 27, 2014:  He is doing well.   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  2 very hard games of 3 on 3 , half court game.  No pain.    Active. Exercises.   Increased his Trazadone to help with some additional depression.   July 14 2013:  William NajjarLarry is doing well.  He remains very active.  Plays in the silver games.   No CP.    Has has a URI for the past 10 days which has slowed him down a bit.    Current Outpatient Prescriptions on File Prior to Visit  Medication Sig Dispense Refill  . aspirin 81 MG tablet Take 81 mg by mouth daily.      Marland Kitchen. atorvastatin (LIPITOR) 20 MG tablet TAKE 1 TABLET BY MOUTH ONCE DAILY  90 tablet  0  . BYSTOLIC 5 MG tablet TAKE 1 TABLET BY MOUTH DAILY.  30 tablet  0  . fish oil-omega-3 fatty acids 1000 MG capsule Take 1 g by mouth daily.       . Flaxseed, Linseed, (FLAX SEED OIL PO) Take by mouth  daily.        . irbesartan (AVAPRO) 150 MG tablet Take 0.5 tablets (75 mg total) by mouth at bedtime.  90 tablet  3  . Multiple Vitamin (MULTIVITAMIN PO) Take by mouth daily.        . ranitidine (ZANTAC) 150 MG capsule Take 150 mg by mouth every evening.        . TRAZODONE HCL PO Take 200 mg by mouth at bedtime as needed.        No current facility-administered medications on file prior to visit.    Allergies  Allergen Reactions  . Lisinopril   . Penicillins     Past Medical History  Diagnosis Date  . Hyperlipidemia   . Arrhythmia     afib  . Depression   . Coronary artery disease     2003  . S/P hernia surgery     Past Surgical History  Procedure Laterality Date  . Hernia repair    . Cholecystectomy    . Cardiac catheterization      11/2001  . Coronary artery bypass graft  2000    History  Smoking status  . Never Smoker   Smokeless tobacco  .  Not on file    History  Alcohol Use No    Family History  Problem Relation Age of Onset  . Heart disease Father     Reviw of Systems:  Reviewed in the HPI.  All other systems are negative.  Physical Exam: BP 143/76  Pulse 65  Ht 5\' 7"  (1.702 m)  Wt 186 lb 12.8 oz (84.732 kg)  BMI 29.25 kg/m2 The patient is alert and oriented x 3.  The mood and affect are normal.   Skin: warm and dry.  Color is normal.    HEENT:   normal  Lungs: few rales in right base - partially cleared with deep breaths  Heart: RR, no murmurs  , normal S1., s2  Abdomen: soft, good BS  Extremities:  No edema  Neuro:  Non focal  ECG:  Oct. 27, , 2014:  Sinus bradycardia at 50.    Assessment / Plan:

## 2013-07-14 NOTE — Assessment & Plan Note (Signed)
His lipids are very well controlled.

## 2013-07-14 NOTE — Patient Instructions (Signed)
Your physician has recommended you make the following change in your medication:  START Proair inhaler 2 puffs every 6 hours  Your physician wants you to follow-up in: 6 months with Dr. Elease HashimotoNahser with fasting labs. You will receive a reminder letter in the mail two months in advance. If you don't receive a letter, please call our office to schedule the follow-up appointment.  Your physician recommends that you return for lab work in: 6 months with your office visit - you need to fast for this appointment (nothing to eat or drink after midnight except water)

## 2013-07-14 NOTE — Assessment & Plan Note (Signed)
He is not having any chest pain.  Doing well.  Exercising regularly.

## 2013-07-15 ENCOUNTER — Encounter (HOSPITAL_COMMUNITY)
Admission: RE | Admit: 2013-07-15 | Discharge: 2013-07-15 | Disposition: A | Discharge status: Home / Self Care | Payer: Self-pay | Source: Ambulatory Visit | Attending: Cardiovascular Disease | Admitting: Cardiovascular Disease

## 2013-07-15 ENCOUNTER — Other Ambulatory Visit: Payer: Self-pay | Admitting: Cardiovascular Disease

## 2013-07-20 ENCOUNTER — Encounter (HOSPITAL_COMMUNITY)
Admission: RE | Admit: 2013-07-20 | Discharge: 2013-07-20 | Disposition: A | Discharge status: Home / Self Care | Payer: Self-pay | Source: Ambulatory Visit | Attending: Cardiovascular Disease | Admitting: Cardiovascular Disease

## 2013-07-20 DIAGNOSIS — E669 Obesity, unspecified: Secondary | ICD-10-CM | POA: Insufficient documentation

## 2013-07-20 DIAGNOSIS — Z951 Presence of aortocoronary bypass graft: Secondary | ICD-10-CM | POA: Insufficient documentation

## 2013-07-20 DIAGNOSIS — I2510 Atherosclerotic heart disease of native coronary artery without angina pectoris: Secondary | ICD-10-CM | POA: Insufficient documentation

## 2013-07-20 DIAGNOSIS — E7800 Pure hypercholesterolemia, unspecified: Secondary | ICD-10-CM | POA: Insufficient documentation

## 2013-07-20 DIAGNOSIS — Z8249 Family history of ischemic heart disease and other diseases of the circulatory system: Secondary | ICD-10-CM | POA: Insufficient documentation

## 2013-07-20 DIAGNOSIS — Z5189 Encounter for other specified aftercare: Secondary | ICD-10-CM | POA: Insufficient documentation

## 2013-07-20 DIAGNOSIS — I10 Essential (primary) hypertension: Secondary | ICD-10-CM | POA: Insufficient documentation

## 2013-07-21 ENCOUNTER — Encounter (HOSPITAL_COMMUNITY): Payer: Self-pay

## 2013-07-22 ENCOUNTER — Encounter (HOSPITAL_COMMUNITY)
Admission: RE | Admit: 2013-07-22 | Discharge: 2013-07-22 | Disposition: A | Discharge status: Home / Self Care | Payer: Self-pay | Source: Ambulatory Visit | Attending: Cardiovascular Disease | Admitting: Cardiovascular Disease

## 2013-07-27 ENCOUNTER — Encounter (HOSPITAL_COMMUNITY)
Admission: RE | Admit: 2013-07-27 | Discharge: 2013-07-27 | Disposition: A | Discharge status: Home / Self Care | Payer: Self-pay | Source: Ambulatory Visit | Attending: Cardiovascular Disease | Admitting: Cardiovascular Disease

## 2013-07-28 ENCOUNTER — Encounter (HOSPITAL_COMMUNITY): Payer: Self-pay

## 2013-07-29 ENCOUNTER — Encounter (HOSPITAL_COMMUNITY)
Admission: RE | Admit: 2013-07-29 | Discharge: 2013-07-29 | Disposition: A | Discharge status: Home / Self Care | Payer: Self-pay | Source: Ambulatory Visit | Attending: Cardiovascular Disease | Admitting: Cardiovascular Disease

## 2013-08-02 ENCOUNTER — Other Ambulatory Visit: Payer: Self-pay | Admitting: Cardiovascular Disease

## 2013-08-03 ENCOUNTER — Encounter (HOSPITAL_COMMUNITY): Payer: Self-pay

## 2013-08-04 ENCOUNTER — Encounter (HOSPITAL_COMMUNITY): Payer: Self-pay

## 2013-08-05 ENCOUNTER — Encounter (HOSPITAL_COMMUNITY)
Admission: RE | Admit: 2013-08-05 | Discharge: 2013-08-05 | Disposition: A | Discharge status: Home / Self Care | Payer: Self-pay | Source: Ambulatory Visit | Attending: Cardiovascular Disease | Admitting: Cardiovascular Disease

## 2013-08-10 ENCOUNTER — Encounter (HOSPITAL_COMMUNITY)
Admission: RE | Admit: 2013-08-10 | Discharge: 2013-08-10 | Disposition: A | Discharge status: Home / Self Care | Payer: Self-pay | Source: Ambulatory Visit | Attending: Cardiovascular Disease | Admitting: Cardiovascular Disease

## 2013-08-11 ENCOUNTER — Encounter (HOSPITAL_COMMUNITY): Payer: Self-pay

## 2013-08-12 ENCOUNTER — Encounter (HOSPITAL_COMMUNITY)
Admission: RE | Admit: 2013-08-12 | Discharge: 2013-08-12 | Disposition: A | Discharge status: Home / Self Care | Payer: Self-pay | Source: Ambulatory Visit | Attending: Cardiovascular Disease | Admitting: Cardiovascular Disease

## 2013-08-16 ENCOUNTER — Other Ambulatory Visit: Payer: Self-pay | Admitting: Cardiovascular Disease

## 2013-08-17 ENCOUNTER — Encounter (HOSPITAL_COMMUNITY)
Admission: RE | Admit: 2013-08-17 | Discharge: 2013-08-17 | Disposition: A | Discharge status: Home / Self Care | Payer: Self-pay | Source: Ambulatory Visit | Attending: Cardiovascular Disease | Admitting: Cardiovascular Disease

## 2013-08-17 DIAGNOSIS — I10 Essential (primary) hypertension: Secondary | ICD-10-CM | POA: Insufficient documentation

## 2013-08-17 DIAGNOSIS — Z8249 Family history of ischemic heart disease and other diseases of the circulatory system: Secondary | ICD-10-CM | POA: Insufficient documentation

## 2013-08-17 DIAGNOSIS — Z5189 Encounter for other specified aftercare: Secondary | ICD-10-CM | POA: Insufficient documentation

## 2013-08-17 DIAGNOSIS — I2510 Atherosclerotic heart disease of native coronary artery without angina pectoris: Secondary | ICD-10-CM | POA: Insufficient documentation

## 2013-08-17 DIAGNOSIS — E7800 Pure hypercholesterolemia, unspecified: Secondary | ICD-10-CM | POA: Insufficient documentation

## 2013-08-17 DIAGNOSIS — Z951 Presence of aortocoronary bypass graft: Secondary | ICD-10-CM | POA: Insufficient documentation

## 2013-08-17 DIAGNOSIS — E669 Obesity, unspecified: Secondary | ICD-10-CM | POA: Insufficient documentation

## 2013-08-18 ENCOUNTER — Encounter (HOSPITAL_COMMUNITY): Payer: Self-pay

## 2013-08-19 ENCOUNTER — Encounter (HOSPITAL_COMMUNITY): Payer: Self-pay

## 2013-08-24 ENCOUNTER — Encounter (HOSPITAL_COMMUNITY)
Admission: RE | Admit: 2013-08-24 | Discharge: 2013-08-24 | Disposition: A | Discharge status: Home / Self Care | Payer: Self-pay | Source: Ambulatory Visit | Attending: Cardiovascular Disease | Admitting: Cardiovascular Disease

## 2013-08-25 ENCOUNTER — Encounter (HOSPITAL_COMMUNITY): Payer: Self-pay

## 2013-08-26 ENCOUNTER — Encounter (HOSPITAL_COMMUNITY): Payer: Self-pay

## 2013-08-28 ENCOUNTER — Other Ambulatory Visit: Payer: Self-pay | Admitting: Cardiovascular Disease

## 2013-08-30 ENCOUNTER — Telehealth: Payer: Self-pay | Admitting: Cardiology

## 2013-08-30 NOTE — Telephone Encounter (Signed)
Pt called with some chest dullness. Sx's not exertional and unlike his pre CABG pain. His B/P was high 180-200 systolic and I suggested he take an extra 75 mg Avapro and call the office in am. He knows to come to the ER if his symptoms worsen.  Corine ShelterLUKE Carmon Sahli PA-C 08/30/2013 6:33 PM

## 2013-08-31 ENCOUNTER — Encounter (HOSPITAL_COMMUNITY)
Admission: RE | Admit: 2013-08-31 | Discharge: 2013-08-31 | Disposition: A | Discharge status: Home / Self Care | Payer: Self-pay | Source: Ambulatory Visit | Attending: Cardiovascular Disease | Admitting: Cardiovascular Disease

## 2013-09-01 ENCOUNTER — Encounter (HOSPITAL_COMMUNITY): Payer: Self-pay

## 2013-09-02 ENCOUNTER — Encounter (HOSPITAL_COMMUNITY)
Admission: RE | Admit: 2013-09-02 | Discharge: 2013-09-02 | Disposition: A | Discharge status: Home / Self Care | Payer: Self-pay | Source: Ambulatory Visit | Attending: Cardiovascular Disease | Admitting: Cardiovascular Disease

## 2013-09-07 ENCOUNTER — Encounter (HOSPITAL_COMMUNITY)
Admission: RE | Admit: 2013-09-07 | Discharge: 2013-09-07 | Disposition: A | Discharge status: Home / Self Care | Payer: Self-pay | Source: Ambulatory Visit | Attending: Cardiovascular Disease | Admitting: Cardiovascular Disease

## 2013-09-08 ENCOUNTER — Encounter (HOSPITAL_COMMUNITY): Payer: Self-pay

## 2013-09-09 ENCOUNTER — Encounter (HOSPITAL_COMMUNITY)
Admission: RE | Admit: 2013-09-09 | Discharge: 2013-09-09 | Disposition: A | Discharge status: Home / Self Care | Payer: Self-pay | Source: Ambulatory Visit | Attending: Cardiovascular Disease | Admitting: Cardiovascular Disease

## 2013-09-14 ENCOUNTER — Encounter (HOSPITAL_COMMUNITY): Payer: Self-pay

## 2013-09-15 ENCOUNTER — Encounter (HOSPITAL_COMMUNITY): Payer: Medicare Other

## 2013-09-15 DIAGNOSIS — Z8249 Family history of ischemic heart disease and other diseases of the circulatory system: Secondary | ICD-10-CM | POA: Insufficient documentation

## 2013-09-15 DIAGNOSIS — Z951 Presence of aortocoronary bypass graft: Secondary | ICD-10-CM | POA: Insufficient documentation

## 2013-09-15 DIAGNOSIS — E7800 Pure hypercholesterolemia, unspecified: Secondary | ICD-10-CM | POA: Insufficient documentation

## 2013-09-15 DIAGNOSIS — I10 Essential (primary) hypertension: Secondary | ICD-10-CM | POA: Insufficient documentation

## 2013-09-15 DIAGNOSIS — E669 Obesity, unspecified: Secondary | ICD-10-CM | POA: Insufficient documentation

## 2013-09-15 DIAGNOSIS — Z5189 Encounter for other specified aftercare: Secondary | ICD-10-CM | POA: Insufficient documentation

## 2013-09-15 DIAGNOSIS — I2510 Atherosclerotic heart disease of native coronary artery without angina pectoris: Secondary | ICD-10-CM | POA: Insufficient documentation

## 2013-09-16 ENCOUNTER — Encounter (HOSPITAL_COMMUNITY): Payer: Self-pay

## 2013-09-21 ENCOUNTER — Encounter (HOSPITAL_COMMUNITY): Payer: Self-pay

## 2013-09-22 ENCOUNTER — Encounter (HOSPITAL_COMMUNITY): Payer: Self-pay

## 2013-09-23 ENCOUNTER — Encounter (HOSPITAL_COMMUNITY)
Admission: RE | Admit: 2013-09-23 | Discharge: 2013-09-23 | Disposition: A | Discharge status: Home / Self Care | Payer: Self-pay | Source: Ambulatory Visit | Attending: Cardiovascular Disease | Admitting: Cardiovascular Disease

## 2013-09-28 ENCOUNTER — Encounter (HOSPITAL_COMMUNITY)
Admission: RE | Admit: 2013-09-28 | Discharge: 2013-09-28 | Disposition: A | Discharge status: Home / Self Care | Payer: Self-pay | Source: Ambulatory Visit | Attending: Cardiovascular Disease | Admitting: Cardiovascular Disease

## 2013-09-29 ENCOUNTER — Encounter (HOSPITAL_COMMUNITY): Payer: Self-pay

## 2013-09-30 ENCOUNTER — Encounter (HOSPITAL_COMMUNITY)
Admission: RE | Admit: 2013-09-30 | Discharge: 2013-09-30 | Disposition: A | Discharge status: Home / Self Care | Payer: Self-pay | Source: Ambulatory Visit | Attending: Cardiovascular Disease | Admitting: Cardiovascular Disease

## 2013-10-05 ENCOUNTER — Encounter (HOSPITAL_COMMUNITY)
Admission: RE | Admit: 2013-10-05 | Discharge: 2013-10-05 | Disposition: A | Discharge status: Home / Self Care | Payer: Self-pay | Source: Ambulatory Visit | Attending: Cardiovascular Disease | Admitting: Cardiovascular Disease

## 2013-10-06 ENCOUNTER — Encounter (HOSPITAL_COMMUNITY): Admission: RE | Admit: 2013-10-06 | Payer: Self-pay | Source: Ambulatory Visit

## 2013-10-07 ENCOUNTER — Encounter (HOSPITAL_COMMUNITY): Payer: Self-pay

## 2013-10-12 ENCOUNTER — Encounter (HOSPITAL_COMMUNITY)
Admission: RE | Admit: 2013-10-12 | Discharge: 2013-10-12 | Disposition: A | Discharge status: Home / Self Care | Payer: Self-pay | Source: Ambulatory Visit | Attending: Cardiovascular Disease | Admitting: Cardiovascular Disease

## 2013-10-13 ENCOUNTER — Encounter (HOSPITAL_COMMUNITY): Admission: RE | Admit: 2013-10-13 | Payer: Medicare Other | Source: Ambulatory Visit

## 2013-10-14 ENCOUNTER — Encounter (HOSPITAL_COMMUNITY): Payer: Self-pay

## 2013-10-19 ENCOUNTER — Encounter (HOSPITAL_COMMUNITY)
Admission: RE | Admit: 2013-10-19 | Discharge: 2013-10-19 | Disposition: A | Discharge status: Home / Self Care | Payer: Self-pay | Source: Ambulatory Visit | Attending: Cardiovascular Disease | Admitting: Cardiovascular Disease

## 2013-10-19 DIAGNOSIS — E7800 Pure hypercholesterolemia, unspecified: Secondary | ICD-10-CM | POA: Insufficient documentation

## 2013-10-19 DIAGNOSIS — Z8249 Family history of ischemic heart disease and other diseases of the circulatory system: Secondary | ICD-10-CM | POA: Insufficient documentation

## 2013-10-19 DIAGNOSIS — E669 Obesity, unspecified: Secondary | ICD-10-CM | POA: Insufficient documentation

## 2013-10-19 DIAGNOSIS — Z951 Presence of aortocoronary bypass graft: Secondary | ICD-10-CM | POA: Insufficient documentation

## 2013-10-19 DIAGNOSIS — I10 Essential (primary) hypertension: Secondary | ICD-10-CM | POA: Insufficient documentation

## 2013-10-19 DIAGNOSIS — Z5189 Encounter for other specified aftercare: Secondary | ICD-10-CM | POA: Insufficient documentation

## 2013-10-19 DIAGNOSIS — I2510 Atherosclerotic heart disease of native coronary artery without angina pectoris: Secondary | ICD-10-CM | POA: Insufficient documentation

## 2013-10-20 ENCOUNTER — Encounter (HOSPITAL_COMMUNITY): Payer: Self-pay

## 2013-10-21 ENCOUNTER — Encounter (HOSPITAL_COMMUNITY)
Admission: RE | Admit: 2013-10-21 | Discharge: 2013-10-21 | Disposition: A | Discharge status: Home / Self Care | Payer: Self-pay | Source: Ambulatory Visit | Attending: Cardiovascular Disease | Admitting: Cardiovascular Disease

## 2013-10-26 ENCOUNTER — Encounter (HOSPITAL_COMMUNITY)
Admission: RE | Admit: 2013-10-26 | Discharge: 2013-10-26 | Disposition: A | Discharge status: Home / Self Care | Payer: Self-pay | Source: Ambulatory Visit | Attending: Cardiovascular Disease | Admitting: Cardiovascular Disease

## 2013-10-27 ENCOUNTER — Encounter (HOSPITAL_COMMUNITY): Payer: Self-pay

## 2013-10-28 ENCOUNTER — Encounter (HOSPITAL_COMMUNITY): Payer: Self-pay

## 2013-10-29 ENCOUNTER — Encounter (HOSPITAL_COMMUNITY)
Admission: RE | Admit: 2013-10-29 | Discharge: 2013-10-29 | Disposition: A | Discharge status: Home / Self Care | Payer: Self-pay | Source: Ambulatory Visit | Attending: Cardiovascular Disease | Admitting: Cardiovascular Disease

## 2013-11-01 ENCOUNTER — Encounter (HOSPITAL_COMMUNITY)
Admission: RE | Admit: 2013-11-01 | Discharge: 2013-11-01 | Disposition: A | Discharge status: Home / Self Care | Payer: Self-pay | Source: Ambulatory Visit | Attending: Cardiovascular Disease | Admitting: Cardiovascular Disease

## 2013-11-02 ENCOUNTER — Encounter (HOSPITAL_COMMUNITY): Payer: Self-pay

## 2013-11-03 ENCOUNTER — Encounter (HOSPITAL_COMMUNITY): Admission: RE | Admit: 2013-11-03 | Payer: Self-pay | Source: Ambulatory Visit

## 2013-11-04 ENCOUNTER — Encounter (HOSPITAL_COMMUNITY)
Admission: RE | Admit: 2013-11-04 | Discharge: 2013-11-04 | Disposition: A | Discharge status: Home / Self Care | Payer: Self-pay | Source: Ambulatory Visit | Attending: Cardiovascular Disease | Admitting: Cardiovascular Disease

## 2013-11-09 ENCOUNTER — Encounter (HOSPITAL_COMMUNITY)
Admission: RE | Admit: 2013-11-09 | Discharge: 2013-11-09 | Disposition: A | Discharge status: Home / Self Care | Payer: Self-pay | Source: Ambulatory Visit | Attending: Cardiovascular Disease | Admitting: Cardiovascular Disease

## 2013-11-10 ENCOUNTER — Encounter (HOSPITAL_COMMUNITY): Admission: RE | Admit: 2013-11-10 | Payer: Self-pay | Source: Ambulatory Visit

## 2013-11-11 ENCOUNTER — Encounter (HOSPITAL_COMMUNITY)
Admission: RE | Admit: 2013-11-11 | Discharge: 2013-11-11 | Disposition: A | Discharge status: Home / Self Care | Payer: Self-pay | Source: Ambulatory Visit | Attending: Cardiovascular Disease | Admitting: Cardiovascular Disease

## 2013-11-12 ENCOUNTER — Other Ambulatory Visit: Payer: Self-pay | Admitting: Cardiovascular Disease

## 2013-11-16 ENCOUNTER — Encounter (HOSPITAL_COMMUNITY)
Admission: RE | Admit: 2013-11-16 | Discharge: 2013-11-16 | Disposition: A | Discharge status: Home / Self Care | Payer: Self-pay | Source: Ambulatory Visit | Attending: Cardiovascular Disease | Admitting: Cardiovascular Disease

## 2013-11-16 DIAGNOSIS — Z951 Presence of aortocoronary bypass graft: Secondary | ICD-10-CM | POA: Insufficient documentation

## 2013-11-16 DIAGNOSIS — I10 Essential (primary) hypertension: Secondary | ICD-10-CM | POA: Insufficient documentation

## 2013-11-16 DIAGNOSIS — Z5189 Encounter for other specified aftercare: Secondary | ICD-10-CM | POA: Insufficient documentation

## 2013-11-16 DIAGNOSIS — I2510 Atherosclerotic heart disease of native coronary artery without angina pectoris: Secondary | ICD-10-CM | POA: Insufficient documentation

## 2013-11-16 DIAGNOSIS — Z8249 Family history of ischemic heart disease and other diseases of the circulatory system: Secondary | ICD-10-CM | POA: Insufficient documentation

## 2013-11-16 DIAGNOSIS — E669 Obesity, unspecified: Secondary | ICD-10-CM | POA: Insufficient documentation

## 2013-11-16 DIAGNOSIS — E7800 Pure hypercholesterolemia, unspecified: Secondary | ICD-10-CM | POA: Insufficient documentation

## 2013-11-17 ENCOUNTER — Encounter (HOSPITAL_COMMUNITY): Payer: Self-pay

## 2013-11-18 ENCOUNTER — Encounter (HOSPITAL_COMMUNITY)
Admission: RE | Admit: 2013-11-18 | Discharge: 2013-11-18 | Disposition: A | Discharge status: Home / Self Care | Payer: Self-pay | Source: Ambulatory Visit | Attending: Cardiovascular Disease | Admitting: Cardiovascular Disease

## 2013-11-23 ENCOUNTER — Encounter (HOSPITAL_COMMUNITY): Payer: Self-pay

## 2013-11-24 ENCOUNTER — Encounter (HOSPITAL_COMMUNITY): Payer: Self-pay

## 2013-11-25 ENCOUNTER — Encounter (HOSPITAL_COMMUNITY)
Admission: RE | Admit: 2013-11-25 | Discharge: 2013-11-25 | Disposition: A | Discharge status: Home / Self Care | Payer: Self-pay | Source: Ambulatory Visit | Attending: Cardiovascular Disease | Admitting: Cardiovascular Disease

## 2013-11-30 ENCOUNTER — Encounter (HOSPITAL_COMMUNITY)
Admission: RE | Admit: 2013-11-30 | Discharge: 2013-11-30 | Disposition: A | Discharge status: Home / Self Care | Payer: Self-pay | Source: Ambulatory Visit | Attending: Cardiovascular Disease | Admitting: Cardiovascular Disease

## 2013-12-01 ENCOUNTER — Encounter (HOSPITAL_COMMUNITY): Payer: Self-pay

## 2013-12-02 ENCOUNTER — Encounter (HOSPITAL_COMMUNITY): Payer: Self-pay

## 2013-12-06 ENCOUNTER — Encounter (HOSPITAL_COMMUNITY)
Admission: RE | Admit: 2013-12-06 | Discharge: 2013-12-06 | Disposition: A | Discharge status: Home / Self Care | Payer: Self-pay | Source: Ambulatory Visit | Attending: Cardiovascular Disease | Admitting: Cardiovascular Disease

## 2013-12-07 ENCOUNTER — Encounter (HOSPITAL_COMMUNITY)
Admission: RE | Admit: 2013-12-07 | Discharge: 2013-12-07 | Disposition: A | Discharge status: Home / Self Care | Payer: Self-pay | Source: Ambulatory Visit | Attending: Cardiovascular Disease | Admitting: Cardiovascular Disease

## 2013-12-08 ENCOUNTER — Encounter (HOSPITAL_COMMUNITY): Payer: Self-pay

## 2013-12-09 ENCOUNTER — Encounter (HOSPITAL_COMMUNITY)
Admission: RE | Admit: 2013-12-09 | Discharge: 2013-12-09 | Disposition: A | Discharge status: Home / Self Care | Payer: Self-pay | Source: Ambulatory Visit | Attending: Cardiovascular Disease | Admitting: Cardiovascular Disease

## 2013-12-14 ENCOUNTER — Encounter (HOSPITAL_COMMUNITY)
Admission: RE | Admit: 2013-12-14 | Discharge: 2013-12-14 | Disposition: A | Discharge status: Home / Self Care | Payer: Self-pay | Source: Ambulatory Visit | Attending: Cardiovascular Disease | Admitting: Cardiovascular Disease

## 2013-12-15 ENCOUNTER — Encounter (HOSPITAL_COMMUNITY): Payer: Self-pay

## 2013-12-16 ENCOUNTER — Encounter (HOSPITAL_COMMUNITY): Payer: Medicare Other

## 2013-12-16 DIAGNOSIS — I25810 Atherosclerosis of coronary artery bypass graft(s) without angina pectoris: Secondary | ICD-10-CM | POA: Insufficient documentation

## 2013-12-16 DIAGNOSIS — I4891 Unspecified atrial fibrillation: Secondary | ICD-10-CM | POA: Insufficient documentation

## 2013-12-16 DIAGNOSIS — Z880 Allergy status to penicillin: Secondary | ICD-10-CM | POA: Insufficient documentation

## 2013-12-16 DIAGNOSIS — E785 Hyperlipidemia, unspecified: Secondary | ICD-10-CM | POA: Insufficient documentation

## 2013-12-16 DIAGNOSIS — Z7982 Long term (current) use of aspirin: Secondary | ICD-10-CM | POA: Insufficient documentation

## 2013-12-16 DIAGNOSIS — I499 Cardiac arrhythmia, unspecified: Secondary | ICD-10-CM | POA: Insufficient documentation

## 2013-12-21 ENCOUNTER — Encounter (HOSPITAL_COMMUNITY)
Admission: RE | Admit: 2013-12-21 | Discharge: 2013-12-21 | Disposition: A | Discharge status: Home / Self Care | Payer: Self-pay | Source: Ambulatory Visit | Attending: Cardiovascular Disease | Admitting: Cardiovascular Disease

## 2013-12-22 ENCOUNTER — Encounter (HOSPITAL_COMMUNITY): Payer: Self-pay

## 2013-12-23 ENCOUNTER — Encounter (HOSPITAL_COMMUNITY)
Admission: RE | Admit: 2013-12-23 | Discharge: 2013-12-23 | Disposition: A | Discharge status: Home / Self Care | Payer: Self-pay | Source: Ambulatory Visit | Attending: Cardiovascular Disease | Admitting: Cardiovascular Disease

## 2013-12-28 ENCOUNTER — Encounter (HOSPITAL_COMMUNITY)
Admission: RE | Admit: 2013-12-28 | Discharge: 2013-12-28 | Disposition: A | Discharge status: Home / Self Care | Payer: Self-pay | Source: Ambulatory Visit | Attending: Cardiovascular Disease | Admitting: Cardiovascular Disease

## 2013-12-29 ENCOUNTER — Encounter (HOSPITAL_COMMUNITY): Payer: Self-pay

## 2013-12-30 ENCOUNTER — Encounter (HOSPITAL_COMMUNITY): Payer: Self-pay

## 2014-01-04 ENCOUNTER — Encounter (HOSPITAL_COMMUNITY)
Admission: RE | Admit: 2014-01-04 | Discharge: 2014-01-04 | Disposition: A | Discharge status: Home / Self Care | Payer: Self-pay | Source: Ambulatory Visit | Attending: Cardiovascular Disease | Admitting: Cardiovascular Disease

## 2014-01-05 ENCOUNTER — Encounter (HOSPITAL_COMMUNITY): Payer: Self-pay

## 2014-01-06 ENCOUNTER — Encounter (HOSPITAL_COMMUNITY): Payer: Self-pay

## 2014-01-10 ENCOUNTER — Other Ambulatory Visit: Payer: Self-pay | Admitting: Cardiovascular Disease

## 2014-01-10 ENCOUNTER — Other Ambulatory Visit (INDEPENDENT_AMBULATORY_CARE_PROVIDER_SITE_OTHER): Payer: Medicare Other | Admitting: *Deleted

## 2014-01-10 ENCOUNTER — Ambulatory Visit (INDEPENDENT_AMBULATORY_CARE_PROVIDER_SITE_OTHER): Payer: Medicare Other | Admitting: Cardiovascular Disease

## 2014-01-10 ENCOUNTER — Encounter: Payer: Self-pay | Admitting: Cardiovascular Disease

## 2014-01-10 VITALS — BP 138/70 | HR 50 | Ht 67.0 in | Wt 189.0 lb

## 2014-01-10 DIAGNOSIS — E785 Hyperlipidemia, unspecified: Secondary | ICD-10-CM

## 2014-01-10 DIAGNOSIS — I2510 Atherosclerotic heart disease of native coronary artery without angina pectoris: Secondary | ICD-10-CM

## 2014-01-10 LAB — BASIC METABOLIC PANEL
BUN: 15 mg/dL (ref 6–23)
CHLORIDE: 102 meq/L (ref 96–112)
CO2: 31 meq/L (ref 19–32)
Calcium: 9.2 mg/dL (ref 8.4–10.5)
Creatinine, Ser: 1 mg/dL (ref 0.4–1.5)
GFR: 83.35 mL/min (ref >60.00)
Glucose, Bld: 109 mg/dL — ABNORMAL HIGH (ref 70–99)
Potassium: 4 mEq/L (ref 3.5–5.1)
SODIUM: 136 meq/L (ref 135–145)

## 2014-01-10 LAB — LIPID PANEL
CHOL/HDL RATIO: 3
Cholesterol: 123 mg/dL (ref 0–200)
HDL: 46.5 mg/dL (ref >39.00)
LDL CALC: 63 mg/dL (ref 0–99)
NONHDL: 76.5
Triglycerides: 69 mg/dL (ref 0.0–149.0)
VLDL: 13.8 mg/dL (ref 0.0–40.0)

## 2014-01-10 LAB — HEPATIC FUNCTION PANEL
ALBUMIN: 3.8 g/dL (ref 3.5–5.2)
ALT: 20 U/L (ref 0–53)
AST: 16 U/L (ref 0–37)
Alkaline Phosphatase: 47 U/L (ref 39–117)
Bilirubin, Direct: 0.1 mg/dL (ref 0.0–0.3)
Total Bilirubin: 2.3 mg/dL — ABNORMAL HIGH (ref 0.2–1.2)
Total Protein: 7.5 g/dL (ref 6.0–8.3)

## 2014-01-10 NOTE — Assessment & Plan Note (Signed)
His lipids have been fairly good. His HDL has been low. We'll recheck his lipids today.

## 2014-01-10 NOTE — Progress Notes (Signed)
William KaufmannLawrence D Hernandez  Date of Birth  01/02/1946 [facility] [address]    [address] [place], [place]  [id number] [phone]  Fax  [phone]  Problems 1. CAD ( CABG Oct. 6, 2003)  2. Dyslipidemia 3. Atrial fibrillation 4. Depression  History of Present Illness:  William NajjarLarry is a 68 year old gentleman with a history of coronary disease-status post coronary artery bypass grafting.  He's done very well from a cardiac standpoint. He is participating in cardiac rehabilitation and has not had any episodes of chest pain or shortness breath.  He has done well.  His lipids are well controlled.    He is participating in cardiac rehab and is doing well.  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 He doesn't have any episodes of chest pain or shortness breath when he is active.  He also competed in the [employer] basketball shooting contest.  He continues to have issues with anxiety.   July 14, 2012:  William NajjarLarry is doing well.  He had a nose bleed several weeks ago.  He is exercising regularly.  No angina. No dyspnea.  Oct. 27, 2014:  He is doing well.   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  2 very hard games of 3 on 3 , half court game.  No pain.    Active. Exercises.   Increased his Trazadone to help with some additional depression.   July 14 2013:  William NajjarLarry is doing well.  He remains very active.  Plays in the Senior  games.   No CP.    Has has a URI for the past 10 days which has slowed him down a bit.   Oct. 26, 2015:  William NajjarLarry is doing well.  Bp is well controlled. Doing lots of fishing. Plays basketball on Wednesdays. No Cp .     Current Outpatient Prescriptions on File Prior to Visit  Medication Sig Dispense Refill  . aspirin 81 MG tablet Take 81 mg by mouth daily.      Marland Kitchen. atorvastatin (LIPITOR) 20 MG tablet TAKE 1 TABLET BY MOUTH ONCE DAILY  90 tablet  0  . BYSTOLIC 5 MG tablet TAKE 1 TABLET BY MOUTH  DAILY.  30 tablet  4  . fish oil-omega-3 fatty acids 1000 MG capsule Take 1 g by mouth daily.       . Flaxseed, Linseed, (FLAX SEED OIL PO) Take by mouth daily.        . irbesartan (AVAPRO) 150 MG tablet TAKE 0.5 TABLET (75 MG TOTAL) BY MOUTH AT BEDTIME.  45 tablet  1  . Multiple Vitamin (MULTIVITAMIN PO) Take by mouth daily.        . ranitidine (ZANTAC) 150 MG capsule Take 150 mg by mouth every evening.        . TRAZODONE HCL PO Take 200 mg by mouth at bedtime as needed.        No current facility-administered medications on file prior to visit.    Allergies  Allergen Reactions  . Lisinopril   . Penicillins     Past Medical History  Diagnosis Date  . Hyperlipidemia   . Arrhythmia     afib  . Depression   . Coronary artery disease     2003  . S/P hernia surgery     Past Surgical History  Procedure Laterality Date  . Hernia repair    . Cholecystectomy    .  Cardiac catheterization      11/2001  . Coronary artery bypass graft  2000    History  Smoking status  . Never Smoker   Smokeless tobacco  . Not on file    History  Alcohol Use No    Family History  Problem Relation Age of Onset  . Heart disease Father     Reviw of Systems:  Reviewed in the HPI.  All other systems are negative.  Physical Exam: BP 138/70  Pulse 50  Ht 5\' 7"  (1.702 m)  Wt 189 lb (85.73 kg)  BMI 29.59 kg/m2 The patient is alert and oriented x 3.  The mood and affect are normal.   Skin: warm and dry.  Color is normal.    HEENT:   normal  Lungs: few rales in right base - partially cleared with deep breaths  Heart: RR, no murmurs  , normal S1., s2  Abdomen: soft, good BS  Extremities:  No edema  Neuro:  Non focal  ECG:  Oct. 27, , 2014:  Sinus bradycardia at 50.    Assessment / Plan:

## 2014-01-10 NOTE — Patient Instructions (Signed)
Your physician recommends that you continue on your current medications as directed. Please refer to the Current Medication list given to you today.  Your physician recommends that you have lab work: TODAY  Your physician wants you to follow-up in: 6 months with Dr. Nahser.  You will receive a reminder letter in the mail two months in advance. If you don't receive a letter, please call our office to schedule the follow-up appointment.  

## 2014-01-10 NOTE — Assessment & Plan Note (Signed)
William Hernandez is doing well.  Still very active - fishes and plays basketball on a regular basis. No angina Lipids are very good except HDL is a bit low. Continue same meds. I will see in 6 months

## 2014-01-11 ENCOUNTER — Encounter (HOSPITAL_COMMUNITY)
Admission: RE | Admit: 2014-01-11 | Discharge: 2014-01-11 | Disposition: A | Discharge status: Home / Self Care | Payer: Self-pay | Source: Ambulatory Visit | Attending: Cardiovascular Disease | Admitting: Cardiovascular Disease

## 2014-01-12 ENCOUNTER — Encounter (HOSPITAL_COMMUNITY): Payer: Self-pay

## 2014-01-13 ENCOUNTER — Encounter (HOSPITAL_COMMUNITY): Payer: Self-pay

## 2014-01-18 ENCOUNTER — Encounter (HOSPITAL_COMMUNITY)
Admission: RE | Admit: 2014-01-18 | Discharge: 2014-01-18 | Disposition: A | Discharge status: Home / Self Care | Payer: Self-pay | Source: Ambulatory Visit | Attending: Cardiovascular Disease | Admitting: Cardiovascular Disease

## 2014-01-18 DIAGNOSIS — I2510 Atherosclerotic heart disease of native coronary artery without angina pectoris: Secondary | ICD-10-CM | POA: Insufficient documentation

## 2014-01-18 DIAGNOSIS — Z951 Presence of aortocoronary bypass graft: Secondary | ICD-10-CM | POA: Insufficient documentation

## 2014-01-18 DIAGNOSIS — I4891 Unspecified atrial fibrillation: Secondary | ICD-10-CM | POA: Insufficient documentation

## 2014-01-18 DIAGNOSIS — Z5189 Encounter for other specified aftercare: Secondary | ICD-10-CM | POA: Insufficient documentation

## 2014-01-18 DIAGNOSIS — I499 Cardiac arrhythmia, unspecified: Secondary | ICD-10-CM | POA: Insufficient documentation

## 2014-01-18 DIAGNOSIS — I10 Essential (primary) hypertension: Secondary | ICD-10-CM | POA: Insufficient documentation

## 2014-01-18 DIAGNOSIS — Z7982 Long term (current) use of aspirin: Secondary | ICD-10-CM | POA: Insufficient documentation

## 2014-01-18 DIAGNOSIS — Z880 Allergy status to penicillin: Secondary | ICD-10-CM | POA: Insufficient documentation

## 2014-01-18 DIAGNOSIS — E785 Hyperlipidemia, unspecified: Secondary | ICD-10-CM | POA: Insufficient documentation

## 2014-01-18 DIAGNOSIS — E669 Obesity, unspecified: Secondary | ICD-10-CM | POA: Insufficient documentation

## 2014-01-18 DIAGNOSIS — Z8249 Family history of ischemic heart disease and other diseases of the circulatory system: Secondary | ICD-10-CM | POA: Insufficient documentation

## 2014-01-18 DIAGNOSIS — E780 Pure hypercholesterolemia: Secondary | ICD-10-CM | POA: Insufficient documentation

## 2014-01-19 ENCOUNTER — Encounter (HOSPITAL_COMMUNITY): Payer: Self-pay

## 2014-01-20 ENCOUNTER — Encounter (HOSPITAL_COMMUNITY): Payer: Self-pay

## 2014-01-24 ENCOUNTER — Other Ambulatory Visit: Payer: Self-pay | Admitting: Cardiovascular Disease

## 2014-01-25 ENCOUNTER — Encounter (HOSPITAL_COMMUNITY)
Admission: RE | Admit: 2014-01-25 | Discharge: 2014-01-25 | Disposition: A | Discharge status: Home / Self Care | Payer: Self-pay | Source: Ambulatory Visit | Attending: Cardiovascular Disease | Admitting: Cardiovascular Disease

## 2014-01-26 ENCOUNTER — Encounter (HOSPITAL_COMMUNITY): Payer: Self-pay

## 2014-01-27 ENCOUNTER — Encounter (HOSPITAL_COMMUNITY): Payer: Self-pay

## 2014-02-01 ENCOUNTER — Encounter (HOSPITAL_COMMUNITY)
Admission: RE | Admit: 2014-02-01 | Discharge: 2014-02-01 | Disposition: A | Discharge status: Home / Self Care | Payer: Self-pay | Source: Ambulatory Visit | Attending: Cardiovascular Disease | Admitting: Cardiovascular Disease

## 2014-02-02 ENCOUNTER — Encounter (HOSPITAL_COMMUNITY): Payer: Self-pay

## 2014-02-03 ENCOUNTER — Encounter (HOSPITAL_COMMUNITY)
Admission: RE | Admit: 2014-02-03 | Discharge: 2014-02-03 | Disposition: A | Discharge status: Home / Self Care | Payer: Self-pay | Source: Ambulatory Visit | Attending: Cardiovascular Disease | Admitting: Cardiovascular Disease

## 2014-02-08 ENCOUNTER — Encounter (HOSPITAL_COMMUNITY)
Admission: RE | Admit: 2014-02-08 | Discharge: 2014-02-08 | Disposition: A | Discharge status: Home / Self Care | Payer: Self-pay | Source: Ambulatory Visit | Attending: Cardiovascular Disease | Admitting: Cardiovascular Disease

## 2014-02-09 ENCOUNTER — Encounter (HOSPITAL_COMMUNITY): Payer: Self-pay

## 2014-02-14 ENCOUNTER — Other Ambulatory Visit: Payer: Self-pay | Admitting: Cardiovascular Disease

## 2014-02-15 ENCOUNTER — Encounter (HOSPITAL_COMMUNITY)
Admission: RE | Admit: 2014-02-15 | Discharge: 2014-02-15 | Disposition: A | Discharge status: Home / Self Care | Payer: Self-pay | Source: Ambulatory Visit | Attending: Cardiovascular Disease | Admitting: Cardiovascular Disease

## 2014-02-15 DIAGNOSIS — I10 Essential (primary) hypertension: Secondary | ICD-10-CM | POA: Insufficient documentation

## 2014-02-15 DIAGNOSIS — Z8249 Family history of ischemic heart disease and other diseases of the circulatory system: Secondary | ICD-10-CM | POA: Insufficient documentation

## 2014-02-15 DIAGNOSIS — E669 Obesity, unspecified: Secondary | ICD-10-CM | POA: Insufficient documentation

## 2014-02-15 DIAGNOSIS — I2510 Atherosclerotic heart disease of native coronary artery without angina pectoris: Secondary | ICD-10-CM | POA: Insufficient documentation

## 2014-02-15 DIAGNOSIS — I4891 Unspecified atrial fibrillation: Secondary | ICD-10-CM | POA: Insufficient documentation

## 2014-02-15 DIAGNOSIS — Z5189 Encounter for other specified aftercare: Secondary | ICD-10-CM | POA: Insufficient documentation

## 2014-02-15 DIAGNOSIS — E780 Pure hypercholesterolemia: Secondary | ICD-10-CM | POA: Insufficient documentation

## 2014-02-15 DIAGNOSIS — E785 Hyperlipidemia, unspecified: Secondary | ICD-10-CM | POA: Insufficient documentation

## 2014-02-15 DIAGNOSIS — Z951 Presence of aortocoronary bypass graft: Secondary | ICD-10-CM | POA: Insufficient documentation

## 2014-02-15 DIAGNOSIS — I499 Cardiac arrhythmia, unspecified: Secondary | ICD-10-CM | POA: Insufficient documentation

## 2014-02-15 DIAGNOSIS — Z880 Allergy status to penicillin: Secondary | ICD-10-CM | POA: Insufficient documentation

## 2014-02-15 DIAGNOSIS — Z7982 Long term (current) use of aspirin: Secondary | ICD-10-CM | POA: Insufficient documentation

## 2014-02-16 ENCOUNTER — Encounter (HOSPITAL_COMMUNITY): Payer: Self-pay

## 2014-02-17 ENCOUNTER — Encounter (HOSPITAL_COMMUNITY)
Admission: RE | Admit: 2014-02-17 | Discharge: 2014-02-17 | Disposition: A | Discharge status: Home / Self Care | Payer: Self-pay | Source: Ambulatory Visit | Attending: Cardiovascular Disease | Admitting: Cardiovascular Disease

## 2014-02-22 ENCOUNTER — Encounter (HOSPITAL_COMMUNITY)
Admission: RE | Admit: 2014-02-22 | Discharge: 2014-02-22 | Disposition: A | Discharge status: Home / Self Care | Payer: Self-pay | Source: Ambulatory Visit | Attending: Cardiovascular Disease | Admitting: Cardiovascular Disease

## 2014-02-23 ENCOUNTER — Encounter (HOSPITAL_COMMUNITY): Payer: Self-pay

## 2014-02-24 ENCOUNTER — Encounter (HOSPITAL_COMMUNITY)
Admission: RE | Admit: 2014-02-24 | Discharge: 2014-02-24 | Disposition: A | Discharge status: Home / Self Care | Payer: Self-pay | Source: Ambulatory Visit | Attending: Cardiovascular Disease | Admitting: Cardiovascular Disease

## 2014-03-01 ENCOUNTER — Encounter (HOSPITAL_COMMUNITY): Payer: Self-pay

## 2014-03-02 ENCOUNTER — Encounter (HOSPITAL_COMMUNITY): Payer: Self-pay

## 2014-03-03 ENCOUNTER — Encounter (HOSPITAL_COMMUNITY): Payer: Self-pay

## 2014-03-08 ENCOUNTER — Encounter (HOSPITAL_COMMUNITY): Payer: Self-pay

## 2014-03-09 ENCOUNTER — Encounter (HOSPITAL_COMMUNITY): Payer: Self-pay

## 2014-03-10 ENCOUNTER — Encounter (HOSPITAL_COMMUNITY): Payer: Self-pay

## 2014-03-15 ENCOUNTER — Encounter (HOSPITAL_COMMUNITY)
Admission: RE | Admit: 2014-03-15 | Discharge: 2014-03-15 | Disposition: A | Discharge status: Home / Self Care | Payer: Self-pay | Source: Ambulatory Visit | Attending: Cardiovascular Disease | Admitting: Cardiovascular Disease

## 2014-03-16 ENCOUNTER — Encounter (HOSPITAL_COMMUNITY): Payer: Self-pay

## 2014-03-17 ENCOUNTER — Encounter (HOSPITAL_COMMUNITY)
Admission: RE | Admit: 2014-03-17 | Discharge: 2014-03-17 | Disposition: A | Discharge status: Home / Self Care | Payer: Self-pay | Source: Ambulatory Visit | Attending: Cardiovascular Disease | Admitting: Cardiovascular Disease

## 2014-03-22 ENCOUNTER — Encounter (HOSPITAL_COMMUNITY)
Admission: RE | Admit: 2014-03-22 | Discharge: 2014-03-22 | Disposition: A | Discharge status: Home / Self Care | Payer: Self-pay | Source: Ambulatory Visit | Attending: Cardiovascular Disease | Admitting: Cardiovascular Disease

## 2014-03-22 DIAGNOSIS — Z7982 Long term (current) use of aspirin: Secondary | ICD-10-CM | POA: Insufficient documentation

## 2014-03-22 DIAGNOSIS — I2510 Atherosclerotic heart disease of native coronary artery without angina pectoris: Secondary | ICD-10-CM | POA: Insufficient documentation

## 2014-03-22 DIAGNOSIS — I4891 Unspecified atrial fibrillation: Secondary | ICD-10-CM | POA: Insufficient documentation

## 2014-03-22 DIAGNOSIS — Z880 Allergy status to penicillin: Secondary | ICD-10-CM | POA: Insufficient documentation

## 2014-03-22 DIAGNOSIS — E669 Obesity, unspecified: Secondary | ICD-10-CM | POA: Insufficient documentation

## 2014-03-22 DIAGNOSIS — Z8249 Family history of ischemic heart disease and other diseases of the circulatory system: Secondary | ICD-10-CM | POA: Insufficient documentation

## 2014-03-22 DIAGNOSIS — Z5189 Encounter for other specified aftercare: Secondary | ICD-10-CM | POA: Insufficient documentation

## 2014-03-22 DIAGNOSIS — I499 Cardiac arrhythmia, unspecified: Secondary | ICD-10-CM | POA: Insufficient documentation

## 2014-03-22 DIAGNOSIS — Z951 Presence of aortocoronary bypass graft: Secondary | ICD-10-CM | POA: Insufficient documentation

## 2014-03-22 DIAGNOSIS — E785 Hyperlipidemia, unspecified: Secondary | ICD-10-CM | POA: Insufficient documentation

## 2014-03-22 DIAGNOSIS — I10 Essential (primary) hypertension: Secondary | ICD-10-CM | POA: Insufficient documentation

## 2014-03-22 DIAGNOSIS — E780 Pure hypercholesterolemia: Secondary | ICD-10-CM | POA: Insufficient documentation

## 2014-03-23 ENCOUNTER — Encounter (HOSPITAL_COMMUNITY): Payer: Self-pay

## 2014-03-24 ENCOUNTER — Encounter (HOSPITAL_COMMUNITY)
Admission: RE | Admit: 2014-03-24 | Discharge: 2014-03-24 | Disposition: A | Discharge status: Home / Self Care | Payer: Self-pay | Source: Ambulatory Visit | Attending: Cardiovascular Disease | Admitting: Cardiovascular Disease

## 2014-03-25 ENCOUNTER — Encounter (HOSPITAL_COMMUNITY)
Admission: RE | Admit: 2014-03-25 | Discharge: 2014-03-25 | Disposition: A | Discharge status: Home / Self Care | Payer: Self-pay | Source: Ambulatory Visit | Attending: Cardiovascular Disease | Admitting: Cardiovascular Disease

## 2014-03-29 ENCOUNTER — Encounter (HOSPITAL_COMMUNITY)
Admission: RE | Admit: 2014-03-29 | Discharge: 2014-03-29 | Disposition: A | Discharge status: Home / Self Care | Payer: Self-pay | Source: Ambulatory Visit | Attending: Cardiovascular Disease | Admitting: Cardiovascular Disease

## 2014-03-30 ENCOUNTER — Encounter (HOSPITAL_COMMUNITY): Payer: Self-pay

## 2014-03-31 ENCOUNTER — Encounter (HOSPITAL_COMMUNITY): Payer: Self-pay

## 2014-04-05 ENCOUNTER — Encounter (HOSPITAL_COMMUNITY)
Admission: RE | Admit: 2014-04-05 | Discharge: 2014-04-05 | Disposition: A | Discharge status: Home / Self Care | Payer: Self-pay | Source: Ambulatory Visit | Attending: Cardiovascular Disease | Admitting: Cardiovascular Disease

## 2014-04-06 ENCOUNTER — Encounter (HOSPITAL_COMMUNITY): Payer: Self-pay

## 2014-04-07 ENCOUNTER — Encounter (HOSPITAL_COMMUNITY)
Admission: RE | Admit: 2014-04-07 | Discharge: 2014-04-07 | Disposition: A | Discharge status: Home / Self Care | Payer: Self-pay | Source: Ambulatory Visit | Attending: Cardiovascular Disease | Admitting: Cardiovascular Disease

## 2014-04-11 ENCOUNTER — Telehealth: Payer: Self-pay | Admitting: Cardiovascular Disease

## 2014-04-11 NOTE — Telephone Encounter (Signed)
Calling stating he is scheduled to see Dr. Elease HashimotoNahser on 07/11/14 for annual appointment.  He would like to add to labs a PSA, A1c and Hep C.  His PCP advised him to get Hep C since he has never been tested.  Advised Dr. Elease HashimotoNahser would have to approve the labs.  Will forward to him and his nurse Algernon HuxleyMichelle Swinyer,RN for recommendations.  He would like to be called back to verify that it is OK to have drawn.

## 2014-04-11 NOTE — Telephone Encounter (Signed)
lmtcb

## 2014-04-11 NOTE — Telephone Encounter (Signed)
We certainly can draw the labs. - PSA, Hep. C, HbA1C Since all of those are non cardiac, can we call his primary MD ( Dr. Jeannetta NapElkins) and ask him to fax an order to us. Do we need to be treating for something in order to draw a lab.  I'm not sure.

## 2014-04-11 NOTE — Telephone Encounter (Signed)
New problem   Pt want to know when he get his labs drawn b/f his appt can you also check is psa/a1c and hep c. Please advise pt.

## 2014-04-12 ENCOUNTER — Encounter (HOSPITAL_COMMUNITY): Payer: Self-pay

## 2014-04-13 ENCOUNTER — Encounter (HOSPITAL_COMMUNITY): Payer: Self-pay

## 2014-04-14 ENCOUNTER — Encounter (HOSPITAL_COMMUNITY)
Admission: RE | Admit: 2014-04-14 | Discharge: 2014-04-14 | Disposition: A | Discharge status: Home / Self Care | Payer: Self-pay | Source: Ambulatory Visit | Attending: Cardiovascular Disease | Admitting: Cardiovascular Disease

## 2014-04-15 ENCOUNTER — Encounter (HOSPITAL_COMMUNITY): Payer: Self-pay

## 2014-04-22 NOTE — Telephone Encounter (Signed)
Spoke with Amy, nurse for Dr. Jeannetta NapElkins and advised that patient wants to get lab work requested by Dr. Jeannetta NapElkins here when he gets lipid/liver/bmet for Dr. Elease HashimotoNahser.  I advised Amy that patient has requested HgA1C, Hep C, and PSA and asked that order be faxed if Dr. Jeannetta NapElkins is in agreement. Amy verbalized understanding and agreement.

## 2014-04-27 ENCOUNTER — Encounter (HOSPITAL_COMMUNITY): Payer: Self-pay

## 2014-04-27 DIAGNOSIS — I2510 Atherosclerotic heart disease of native coronary artery without angina pectoris: Secondary | ICD-10-CM | POA: Insufficient documentation

## 2014-04-27 DIAGNOSIS — Z880 Allergy status to penicillin: Secondary | ICD-10-CM | POA: Insufficient documentation

## 2014-04-27 DIAGNOSIS — E870 Hyperosmolality and hypernatremia: Secondary | ICD-10-CM | POA: Insufficient documentation

## 2014-04-27 DIAGNOSIS — Z8249 Family history of ischemic heart disease and other diseases of the circulatory system: Secondary | ICD-10-CM | POA: Insufficient documentation

## 2014-04-27 DIAGNOSIS — Z951 Presence of aortocoronary bypass graft: Secondary | ICD-10-CM | POA: Insufficient documentation

## 2014-04-27 DIAGNOSIS — E669 Obesity, unspecified: Secondary | ICD-10-CM | POA: Insufficient documentation

## 2014-04-27 DIAGNOSIS — I10 Essential (primary) hypertension: Secondary | ICD-10-CM | POA: Insufficient documentation

## 2014-04-27 DIAGNOSIS — E785 Hyperlipidemia, unspecified: Secondary | ICD-10-CM | POA: Insufficient documentation

## 2014-04-27 DIAGNOSIS — I4891 Unspecified atrial fibrillation: Secondary | ICD-10-CM | POA: Insufficient documentation

## 2014-04-27 DIAGNOSIS — Z7982 Long term (current) use of aspirin: Secondary | ICD-10-CM | POA: Insufficient documentation

## 2014-04-27 DIAGNOSIS — Z5189 Encounter for other specified aftercare: Secondary | ICD-10-CM | POA: Insufficient documentation

## 2014-04-27 DIAGNOSIS — I499 Cardiac arrhythmia, unspecified: Secondary | ICD-10-CM | POA: Insufficient documentation

## 2014-04-28 ENCOUNTER — Encounter (HOSPITAL_COMMUNITY): Payer: Self-pay

## 2014-05-03 ENCOUNTER — Encounter (HOSPITAL_COMMUNITY)
Admission: RE | Admit: 2014-05-03 | Discharge: 2014-05-03 | Disposition: A | Discharge status: Home / Self Care | Payer: Self-pay | Source: Ambulatory Visit | Attending: Cardiovascular Disease | Admitting: Cardiovascular Disease

## 2014-05-04 ENCOUNTER — Encounter (HOSPITAL_COMMUNITY): Payer: Self-pay

## 2014-05-05 ENCOUNTER — Encounter (HOSPITAL_COMMUNITY)
Admission: RE | Admit: 2014-05-05 | Discharge: 2014-05-05 | Disposition: A | Discharge status: Home / Self Care | Payer: Self-pay | Source: Ambulatory Visit | Attending: Cardiovascular Disease | Admitting: Cardiovascular Disease

## 2014-05-10 ENCOUNTER — Encounter (HOSPITAL_COMMUNITY)
Admission: RE | Admit: 2014-05-10 | Discharge: 2014-05-10 | Disposition: A | Discharge status: Home / Self Care | Payer: Self-pay | Source: Ambulatory Visit | Attending: Cardiovascular Disease | Admitting: Cardiovascular Disease

## 2014-05-11 ENCOUNTER — Encounter (HOSPITAL_COMMUNITY): Payer: Self-pay

## 2014-05-12 ENCOUNTER — Encounter (HOSPITAL_COMMUNITY)
Admission: RE | Admit: 2014-05-12 | Discharge: 2014-05-12 | Disposition: A | Discharge status: Home / Self Care | Payer: Self-pay | Source: Ambulatory Visit | Attending: Cardiovascular Disease | Admitting: Cardiovascular Disease

## 2014-05-17 ENCOUNTER — Encounter (HOSPITAL_COMMUNITY)
Admission: RE | Admit: 2014-05-17 | Discharge: 2014-05-17 | Disposition: A | Discharge status: Home / Self Care | Payer: Self-pay | Source: Ambulatory Visit | Attending: Cardiovascular Disease | Admitting: Cardiovascular Disease

## 2014-05-17 DIAGNOSIS — Z8249 Family history of ischemic heart disease and other diseases of the circulatory system: Secondary | ICD-10-CM | POA: Insufficient documentation

## 2014-05-17 DIAGNOSIS — E870 Hyperosmolality and hypernatremia: Secondary | ICD-10-CM | POA: Insufficient documentation

## 2014-05-17 DIAGNOSIS — Z951 Presence of aortocoronary bypass graft: Secondary | ICD-10-CM | POA: Insufficient documentation

## 2014-05-17 DIAGNOSIS — I2510 Atherosclerotic heart disease of native coronary artery without angina pectoris: Secondary | ICD-10-CM | POA: Insufficient documentation

## 2014-05-17 DIAGNOSIS — Z7982 Long term (current) use of aspirin: Secondary | ICD-10-CM | POA: Insufficient documentation

## 2014-05-17 DIAGNOSIS — I499 Cardiac arrhythmia, unspecified: Secondary | ICD-10-CM | POA: Insufficient documentation

## 2014-05-17 DIAGNOSIS — I4891 Unspecified atrial fibrillation: Secondary | ICD-10-CM | POA: Insufficient documentation

## 2014-05-17 DIAGNOSIS — Z880 Allergy status to penicillin: Secondary | ICD-10-CM | POA: Insufficient documentation

## 2014-05-17 DIAGNOSIS — E785 Hyperlipidemia, unspecified: Secondary | ICD-10-CM | POA: Insufficient documentation

## 2014-05-17 DIAGNOSIS — I10 Essential (primary) hypertension: Secondary | ICD-10-CM | POA: Insufficient documentation

## 2014-05-17 DIAGNOSIS — Z5189 Encounter for other specified aftercare: Secondary | ICD-10-CM | POA: Insufficient documentation

## 2014-05-17 DIAGNOSIS — E669 Obesity, unspecified: Secondary | ICD-10-CM | POA: Insufficient documentation

## 2014-05-18 ENCOUNTER — Encounter (HOSPITAL_COMMUNITY): Payer: Self-pay

## 2014-05-19 ENCOUNTER — Encounter (HOSPITAL_COMMUNITY)
Admission: RE | Admit: 2014-05-19 | Discharge: 2014-05-19 | Disposition: A | Discharge status: Home / Self Care | Payer: Self-pay | Source: Ambulatory Visit | Attending: Cardiovascular Disease | Admitting: Cardiovascular Disease

## 2014-05-24 ENCOUNTER — Encounter (HOSPITAL_COMMUNITY): Payer: Medicare Other

## 2014-05-25 ENCOUNTER — Encounter (HOSPITAL_COMMUNITY): Payer: Self-pay

## 2014-05-26 ENCOUNTER — Encounter (HOSPITAL_COMMUNITY): Payer: Self-pay

## 2014-05-31 ENCOUNTER — Encounter (HOSPITAL_COMMUNITY)
Admission: RE | Admit: 2014-05-31 | Discharge: 2014-05-31 | Disposition: A | Discharge status: Home / Self Care | Payer: Self-pay | Source: Ambulatory Visit | Attending: Cardiovascular Disease | Admitting: Cardiovascular Disease

## 2014-06-01 ENCOUNTER — Encounter (HOSPITAL_COMMUNITY): Payer: Self-pay

## 2014-06-02 ENCOUNTER — Encounter (HOSPITAL_COMMUNITY)
Admission: RE | Admit: 2014-06-02 | Discharge: 2014-06-02 | Disposition: A | Discharge status: Home / Self Care | Payer: Self-pay | Source: Ambulatory Visit | Attending: Cardiovascular Disease | Admitting: Cardiovascular Disease

## 2014-06-07 ENCOUNTER — Encounter (HOSPITAL_COMMUNITY)
Admission: RE | Admit: 2014-06-07 | Discharge: 2014-06-07 | Disposition: A | Discharge status: Home / Self Care | Payer: Self-pay | Source: Ambulatory Visit | Attending: Cardiovascular Disease | Admitting: Cardiovascular Disease

## 2014-06-08 ENCOUNTER — Encounter (HOSPITAL_COMMUNITY): Payer: Self-pay

## 2014-06-09 ENCOUNTER — Encounter (HOSPITAL_COMMUNITY)
Admission: RE | Admit: 2014-06-09 | Discharge: 2014-06-09 | Disposition: A | Discharge status: Home / Self Care | Payer: Self-pay | Source: Ambulatory Visit | Attending: Cardiovascular Disease | Admitting: Cardiovascular Disease

## 2014-06-14 ENCOUNTER — Encounter (HOSPITAL_COMMUNITY)
Admission: RE | Admit: 2014-06-14 | Discharge: 2014-06-14 | Disposition: A | Discharge status: Home / Self Care | Payer: Self-pay | Source: Ambulatory Visit | Attending: Cardiovascular Disease | Admitting: Cardiovascular Disease

## 2014-06-15 ENCOUNTER — Encounter (HOSPITAL_COMMUNITY): Payer: Self-pay

## 2014-06-16 ENCOUNTER — Encounter (HOSPITAL_COMMUNITY)
Admission: RE | Admit: 2014-06-16 | Discharge: 2014-06-16 | Disposition: A | Discharge status: Home / Self Care | Payer: Self-pay | Source: Ambulatory Visit | Attending: Cardiovascular Disease | Admitting: Cardiovascular Disease

## 2014-06-17 ENCOUNTER — Ambulatory Visit: Payer: Self-pay | Admitting: Family Medicine

## 2014-06-21 ENCOUNTER — Encounter (HOSPITAL_COMMUNITY): Payer: Medicare Other

## 2014-06-21 DIAGNOSIS — E870 Hyperosmolality and hypernatremia: Secondary | ICD-10-CM | POA: Insufficient documentation

## 2014-06-21 DIAGNOSIS — I4891 Unspecified atrial fibrillation: Secondary | ICD-10-CM | POA: Insufficient documentation

## 2014-06-21 DIAGNOSIS — Z8249 Family history of ischemic heart disease and other diseases of the circulatory system: Secondary | ICD-10-CM | POA: Insufficient documentation

## 2014-06-21 DIAGNOSIS — Z5189 Encounter for other specified aftercare: Secondary | ICD-10-CM | POA: Insufficient documentation

## 2014-06-21 DIAGNOSIS — Z951 Presence of aortocoronary bypass graft: Secondary | ICD-10-CM | POA: Insufficient documentation

## 2014-06-21 DIAGNOSIS — I2510 Atherosclerotic heart disease of native coronary artery without angina pectoris: Secondary | ICD-10-CM | POA: Insufficient documentation

## 2014-06-21 DIAGNOSIS — I10 Essential (primary) hypertension: Secondary | ICD-10-CM | POA: Insufficient documentation

## 2014-06-21 DIAGNOSIS — I499 Cardiac arrhythmia, unspecified: Secondary | ICD-10-CM | POA: Insufficient documentation

## 2014-06-21 DIAGNOSIS — Z880 Allergy status to penicillin: Secondary | ICD-10-CM | POA: Insufficient documentation

## 2014-06-21 DIAGNOSIS — E785 Hyperlipidemia, unspecified: Secondary | ICD-10-CM | POA: Insufficient documentation

## 2014-06-21 DIAGNOSIS — E669 Obesity, unspecified: Secondary | ICD-10-CM | POA: Insufficient documentation

## 2014-06-21 DIAGNOSIS — Z7982 Long term (current) use of aspirin: Secondary | ICD-10-CM | POA: Insufficient documentation

## 2014-06-22 ENCOUNTER — Other Ambulatory Visit: Payer: Self-pay | Admitting: Nurse Practitioner

## 2014-06-22 ENCOUNTER — Encounter (HOSPITAL_COMMUNITY): Payer: Self-pay

## 2014-06-22 DIAGNOSIS — N400 Enlarged prostate without lower urinary tract symptoms: Secondary | ICD-10-CM

## 2014-06-22 DIAGNOSIS — E785 Hyperlipidemia, unspecified: Secondary | ICD-10-CM

## 2014-06-22 DIAGNOSIS — Z125 Encounter for screening for malignant neoplasm of prostate: Secondary | ICD-10-CM

## 2014-06-22 DIAGNOSIS — R7303 Prediabetes: Secondary | ICD-10-CM

## 2014-06-22 DIAGNOSIS — Z049 Encounter for examination and observation for unspecified reason: Secondary | ICD-10-CM

## 2014-06-23 ENCOUNTER — Encounter (HOSPITAL_COMMUNITY): Payer: Self-pay

## 2014-06-28 ENCOUNTER — Encounter (HOSPITAL_COMMUNITY)
Admission: RE | Admit: 2014-06-28 | Discharge: 2014-06-28 | Disposition: A | Discharge status: Home / Self Care | Payer: Self-pay | Source: Ambulatory Visit | Attending: Cardiovascular Disease | Admitting: Cardiovascular Disease

## 2014-06-29 ENCOUNTER — Encounter (HOSPITAL_COMMUNITY): Payer: Self-pay

## 2014-06-30 ENCOUNTER — Encounter (HOSPITAL_COMMUNITY): Payer: Self-pay

## 2014-07-05 ENCOUNTER — Encounter (HOSPITAL_COMMUNITY): Payer: Self-pay

## 2014-07-06 ENCOUNTER — Encounter (HOSPITAL_COMMUNITY): Payer: Self-pay

## 2014-07-07 ENCOUNTER — Encounter (HOSPITAL_COMMUNITY): Payer: Self-pay

## 2014-07-12 ENCOUNTER — Encounter (HOSPITAL_COMMUNITY): Payer: Self-pay

## 2014-07-12 ENCOUNTER — Other Ambulatory Visit: Payer: Medicare Other

## 2014-07-12 ENCOUNTER — Ambulatory Visit: Payer: Medicare Other | Admitting: Cardiovascular Disease

## 2014-07-13 ENCOUNTER — Encounter (HOSPITAL_COMMUNITY): Payer: Self-pay

## 2014-07-14 ENCOUNTER — Encounter (HOSPITAL_COMMUNITY)
Admission: RE | Admit: 2014-07-14 | Discharge: 2014-07-14 | Disposition: A | Discharge status: Home / Self Care | Payer: Self-pay | Source: Ambulatory Visit | Attending: Cardiovascular Disease | Admitting: Cardiovascular Disease

## 2014-07-19 ENCOUNTER — Encounter (HOSPITAL_COMMUNITY): Payer: Self-pay

## 2014-07-19 DIAGNOSIS — Z880 Allergy status to penicillin: Secondary | ICD-10-CM | POA: Insufficient documentation

## 2014-07-19 DIAGNOSIS — I2510 Atherosclerotic heart disease of native coronary artery without angina pectoris: Secondary | ICD-10-CM | POA: Insufficient documentation

## 2014-07-19 DIAGNOSIS — I499 Cardiac arrhythmia, unspecified: Secondary | ICD-10-CM | POA: Insufficient documentation

## 2014-07-19 DIAGNOSIS — E669 Obesity, unspecified: Secondary | ICD-10-CM | POA: Insufficient documentation

## 2014-07-19 DIAGNOSIS — I4891 Unspecified atrial fibrillation: Secondary | ICD-10-CM | POA: Insufficient documentation

## 2014-07-19 DIAGNOSIS — Z8249 Family history of ischemic heart disease and other diseases of the circulatory system: Secondary | ICD-10-CM | POA: Insufficient documentation

## 2014-07-19 DIAGNOSIS — I10 Essential (primary) hypertension: Secondary | ICD-10-CM | POA: Insufficient documentation

## 2014-07-19 DIAGNOSIS — E870 Hyperosmolality and hypernatremia: Secondary | ICD-10-CM | POA: Insufficient documentation

## 2014-07-19 DIAGNOSIS — Z951 Presence of aortocoronary bypass graft: Secondary | ICD-10-CM | POA: Insufficient documentation

## 2014-07-19 DIAGNOSIS — Z7982 Long term (current) use of aspirin: Secondary | ICD-10-CM | POA: Insufficient documentation

## 2014-07-19 DIAGNOSIS — Z5189 Encounter for other specified aftercare: Secondary | ICD-10-CM | POA: Insufficient documentation

## 2014-07-19 DIAGNOSIS — E785 Hyperlipidemia, unspecified: Secondary | ICD-10-CM | POA: Insufficient documentation

## 2014-07-20 ENCOUNTER — Encounter (HOSPITAL_COMMUNITY): Payer: Self-pay

## 2014-07-20 NOTE — Progress Notes (Signed)
Cardiology Office Note   Date:  07/20/2014   ID:  William Hernandez, DOB 08/05/1945, MRN [id number]  PCP:  Kaleen MaskELKINS,WILSON OLIVER, MD  Cardiologist:   Vesta MixerNahser, Dollie Mayse J, MD   No chief complaint on file.  1. CAD ( CABG Oct. 6, 2003)  2. Dyslipidemia 3. Atrial fibrillation - post op from CABG.  4. Depression  History of Present Illness:  William Hernandez is a 69 year old gentleman with a history of coronary disease-status post coronary artery bypass grafting. He's done very well from a cardiac standpoint. He is participating in cardiac rehabilitation and has not had any episodes of chest pain or shortness breath.  He has done well. His lipids are well controlled.   He is participating in cardiac rehab and is doing well. 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 He doesn't have any episodes of chest pain or shortness breath when he is active. He also competed in the [employer] senior [employer] basketball shooting contest.  He continues to have issues with anxiety.   July 14, 2012:  William Hernandez is doing well. He had a nose bleed several weeks ago. He is exercising regularly. No angina. No dyspnea.  Oct. 27, 2014:  He is doing well. 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 2 very hard games of 3 on 3 , half court game. No pain. Active. Exercises. Increased his Trazadone to help with some additional depression.   July 14 2013:  William Hernandez is doing well. He remains very active. Plays in the Senior games. No CP. Has has a URI for the past 10 days which has slowed him down a bit.   Oct. 26, 2015:  William Hernandez is doing well.  Bp is well controlled. Doing lots of fishing. Plays basketball on Wednesdays. No Cp .      Jul 21, 2014:  William Hernandez is a 69 y.o. male who presents for follow up of his CAD and hyperlipidemia Still exercising well.   Still playing basketball regularly.  No angina     Past Medical History  Diagnosis  Date  . Hyperlipidemia   . Arrhythmia     afib  . Depression   . Coronary artery disease     2003  . S/P hernia surgery     Past Surgical History  Procedure Laterality Date  . Hernia repair    . Cholecystectomy    . Cardiac catheterization      11/2001  . Coronary artery bypass graft  2000     Current Outpatient Prescriptions  Medication Sig Dispense Refill  . aspirin 81 MG tablet Take 81 mg by mouth daily.    Marland Kitchen. atorvastatin (LIPITOR) 20 MG tablet TAKE 1 TABLET BY MOUTH ONCE DAILY 90 tablet 1  . BYSTOLIC 5 MG tablet TAKE 1 TABLET BY MOUTH DAILY. 30 tablet 6  . fish oil-omega-3 fatty acids 1000 MG capsule Take 1 g by mouth daily.     . Flaxseed, Linseed, (FLAX SEED OIL PO) Take by mouth daily.      . irbesartan (AVAPRO) 150 MG tablet TAKE 1/2 TABLET BY MOUTH AT BEDTIME. 45 tablet 2  . Multiple Vitamin (MULTIVITAMIN PO) Take by mouth daily.      . ranitidine (ZANTAC) 150 MG capsule Take 150 mg by mouth every evening.      . TRAZODONE HCL PO Take 200 mg by mouth at bedtime as needed.      No current facility-administered  medications for this visit.    Allergies:   Lisinopril and Penicillins    Social History:  The patient  reports that he has never smoked. He does not have any smokeless tobacco history on file. He reports that he does not drink alcohol or use illicit drugs.   Family History:  The patient's family history includes Heart disease in his father.    ROS:  Please see the history of present illness.    Review of Systems: Constitutional:  denies fever, chills, diaphoresis, appetite change and fatigue.  HEENT: denies photophobia, eye pain, redness, hearing loss, ear pain, congestion, sore throat, rhinorrhea, sneezing, neck pain, neck stiffness and tinnitus.  Respiratory: denies SOB, DOE, cough, chest tightness, and wheezing.  Cardiovascular: denies chest pain, palpitations and leg swelling.  Gastrointestinal: denies nausea, vomiting, abdominal pain, diarrhea,  constipation, blood in stool.  Genitourinary: denies dysuria, urgency, frequency, hematuria, flank pain and difficulty urinating.  Musculoskeletal: denies  myalgias, back pain, joint swelling, arthralgias and gait problem.   Skin: denies pallor, rash and wound.  Neurological: denies dizziness, seizures, syncope, weakness, light-headedness, numbness and headaches.   Hematological: denies adenopathy, easy bruising, personal or family bleeding history.  Psychiatric/ Behavioral: denies suicidal ideation, mood changes, confusion, nervousness, sleep disturbance and agitation.       All other systems are reviewed and negative.    PHYSICAL EXAM: VS:  There were no vitals taken for this visit. , BMI There is no weight on file to calculate BMI. GEN: Well nourished, well developed, in no acute distress HEENT: normal Neck: no JVD, carotid bruits, or masses Cardiac: RRR; no murmurs, rubs, or gallops,no edema  Respiratory:  clear to auscultation bilaterally, normal work of breathing GI: soft, nontender, nondistended, + BS MS: no deformity or atrophy Skin: warm and dry, no rash Neuro:  Strength and sensation are intact Psych: normal   EKG:  EKG is ordered today. The ekg ordered today demonstrates sinus brady at 54.     Recent Labs: 01/10/2014: ALT 20; BUN 15; Creatinine 1.0; Potassium 4.0; Sodium 136    Lipid Panel    Component Value Date/Time   CHOL 123 01/10/2014 0831   TRIG 69.0 01/10/2014 0831   HDL 46.50 01/10/2014 0831   CHOLHDL 3 01/10/2014 0831   VLDL 13.8 01/10/2014 0831   LDLCALC 63 01/10/2014 0831      Wt Readings from Last 3 Encounters:  01/10/14 189 lb (85.73 kg)  07/14/13 186 lb 12.8 oz (84.732 kg)  01/11/13 189 lb (85.73 kg)      Other studies Reviewed: Additional studies/ records that were reviewed today include: . Review of the above records demonstrates:    ASSESSMENT AND PLAN:  1. CAD ( CABG Oct. 6, 2003)  - doing well.  No angina.  He is very  active.    2. Dyslipidemia- doing well. Continue same meds  3. Atrial fibrillation - occurred following CABG.   4. Depression - he sees [facility] Corporation[facility]y Kincaid,MS   Current medicines are reviewed at length with the patient today.  The patient does not have concerns regarding medicines.  The following changes have been made:  no change  Labs/ tests ordered today include:  No orders of the defined types were placed in this encounter.     Disposition:   FU with me in 6 months       Doniel Maiello, Deloris PingPhilip J, MD  07/20/2014 10:02 PM    [facility] [address], [place], [place]  [id number]  Phone: [phone]; Fax: [phone]   [facility]  [address] [place], [place]  [address] [phone]    Fax [phone]

## 2014-07-21 ENCOUNTER — Other Ambulatory Visit (INDEPENDENT_AMBULATORY_CARE_PROVIDER_SITE_OTHER): Payer: Medicare Other | Admitting: *Deleted

## 2014-07-21 ENCOUNTER — Encounter: Payer: Self-pay | Admitting: Cardiovascular Disease

## 2014-07-21 ENCOUNTER — Encounter (HOSPITAL_COMMUNITY): Payer: Self-pay

## 2014-07-21 ENCOUNTER — Ambulatory Visit (INDEPENDENT_AMBULATORY_CARE_PROVIDER_SITE_OTHER): Payer: Medicare Other | Admitting: Cardiovascular Disease

## 2014-07-21 VITALS — BP 134/78 | HR 54 | Ht 67.0 in | Wt 191.2 lb

## 2014-07-21 DIAGNOSIS — R7309 Other abnormal glucose: Secondary | ICD-10-CM

## 2014-07-21 DIAGNOSIS — R7303 Prediabetes: Secondary | ICD-10-CM

## 2014-07-21 DIAGNOSIS — E785 Hyperlipidemia, unspecified: Secondary | ICD-10-CM

## 2014-07-21 DIAGNOSIS — Z125 Encounter for screening for malignant neoplasm of prostate: Secondary | ICD-10-CM

## 2014-07-21 DIAGNOSIS — I2510 Atherosclerotic heart disease of native coronary artery without angina pectoris: Secondary | ICD-10-CM

## 2014-07-21 DIAGNOSIS — Z049 Encounter for examination and observation for unspecified reason: Secondary | ICD-10-CM

## 2014-07-21 DIAGNOSIS — N400 Enlarged prostate without lower urinary tract symptoms: Secondary | ICD-10-CM

## 2014-07-21 LAB — HEPATIC FUNCTION PANEL
ALBUMIN: 3.9 g/dL (ref 3.5–5.2)
ALT: 20 U/L (ref 0–53)
AST: 20 U/L (ref 0–37)
Alkaline Phosphatase: 45 U/L (ref 39–117)
BILIRUBIN TOTAL: 1.7 mg/dL — AB (ref 0.2–1.2)
Bilirubin, Direct: 0.3 mg/dL (ref 0.0–0.3)
Total Protein: 6.4 g/dL (ref 6.0–8.3)

## 2014-07-21 LAB — LIPID PANEL
Cholesterol: 115 mg/dL (ref 0–200)
HDL: 48.3 mg/dL (ref >39.00)
LDL CALC: 54 mg/dL (ref 0–99)
NONHDL: 66.7
Total CHOL/HDL Ratio: 2
Triglycerides: 66 mg/dL (ref 0.0–149.0)
VLDL: 13.2 mg/dL (ref 0.0–40.0)

## 2014-07-21 LAB — BASIC METABOLIC PANEL
BUN: 13 mg/dL (ref 6–23)
CALCIUM: 9.2 mg/dL (ref 8.4–10.5)
CO2: 22 mEq/L (ref 19–32)
Chloride: 107 mEq/L (ref 96–112)
Creatinine, Ser: 0.88 mg/dL (ref 0.40–1.50)
GFR: 90.91 mL/min (ref >60.00)
GLUCOSE: 113 mg/dL — AB (ref 70–99)
Potassium: 4.3 mEq/L (ref 3.5–5.1)
Sodium: 136 mEq/L (ref 135–145)

## 2014-07-21 LAB — PSA: PSA: 1.95 ng/mL (ref 0.10–4.00)

## 2014-07-21 LAB — HEMOGLOBIN A1C: HEMOGLOBIN A1C: 5.3 % (ref 4.6–6.5)

## 2014-07-21 NOTE — Patient Instructions (Signed)
Medication Instructions:  Your physician recommends that you continue on your current medications as directed. Please refer to the Current Medication list given to you today.   Labwork: Your physician recommends that you return for lab work in: 6 months on the day of or a few days before your office visit with Dr. Nahser.  You will need to FAST for this appointment - nothing to eat or drink after midnight the night before except water.   Testing/Procedures: None  Follow-Up: Your physician wants you to follow-up in: 6 months with Dr. Nahser.  You will receive a reminder letter in the mail two months in advance. If you don't receive a letter, please call our office to schedule the follow-up appointment.      

## 2014-07-22 ENCOUNTER — Telehealth: Payer: Self-pay | Admitting: Family Medicine

## 2014-07-22 LAB — HEPATITIS C ANTIBODY: HCV Ab: NEGATIVE

## 2014-07-22 MED ORDER — SILDENAFIL CITRATE 20 MG PO TABS: ORAL_TABLET | ORAL | Status: DC

## 2014-07-22 NOTE — Telephone Encounter (Signed)
Pt called and stated that cialis is $75 a pill and he can not afford that. He states that his insurance recommend Revatio as a alternative which is covered. He would like a rx of that medication sent to [facility].

## 2014-07-26 ENCOUNTER — Encounter (HOSPITAL_COMMUNITY)
Admission: RE | Admit: 2014-07-26 | Discharge: 2014-07-26 | Disposition: A | Discharge status: Home / Self Care | Payer: Self-pay | Source: Ambulatory Visit | Attending: Cardiovascular Disease | Admitting: Cardiovascular Disease

## 2014-07-26 ENCOUNTER — Telehealth: Payer: Self-pay | Admitting: Cardiovascular Disease

## 2014-07-26 NOTE — Telephone Encounter (Signed)
New Message       [facility] results. State he was told to call our office if he hadn't heard from us before today. Please call back and advise.

## 2014-07-26 NOTE — Telephone Encounter (Signed)
Reviewed result of Hep C test with patient who verbalized understanding.  Copies to PCP per patient request.

## 2014-07-27 ENCOUNTER — Encounter (HOSPITAL_COMMUNITY): Payer: Self-pay

## 2014-07-28 ENCOUNTER — Encounter (HOSPITAL_COMMUNITY): Payer: Self-pay

## 2014-07-29 ENCOUNTER — Encounter (HOSPITAL_COMMUNITY)
Admission: RE | Admit: 2014-07-29 | Discharge: 2014-07-29 | Disposition: A | Discharge status: Home / Self Care | Payer: Self-pay | Source: Ambulatory Visit | Attending: Cardiovascular Disease | Admitting: Cardiovascular Disease

## 2014-08-02 ENCOUNTER — Encounter (HOSPITAL_COMMUNITY)
Admission: RE | Admit: 2014-08-02 | Discharge: 2014-08-02 | Disposition: A | Discharge status: Home / Self Care | Payer: Self-pay | Source: Ambulatory Visit | Attending: Cardiovascular Disease | Admitting: Cardiovascular Disease

## 2014-08-03 ENCOUNTER — Encounter (HOSPITAL_COMMUNITY): Payer: Self-pay

## 2014-08-04 ENCOUNTER — Encounter (HOSPITAL_COMMUNITY)
Admission: RE | Admit: 2014-08-04 | Discharge: 2014-08-04 | Disposition: A | Discharge status: Home / Self Care | Payer: Self-pay | Source: Ambulatory Visit | Attending: Cardiovascular Disease | Admitting: Cardiovascular Disease

## 2014-08-08 ENCOUNTER — Other Ambulatory Visit: Payer: Self-pay | Admitting: Cardiovascular Disease

## 2014-08-09 ENCOUNTER — Encounter (HOSPITAL_COMMUNITY): Payer: Self-pay

## 2014-08-10 ENCOUNTER — Encounter (HOSPITAL_COMMUNITY): Payer: Self-pay

## 2014-08-11 ENCOUNTER — Encounter (HOSPITAL_COMMUNITY)
Admission: RE | Admit: 2014-08-11 | Discharge: 2014-08-11 | Disposition: A | Discharge status: Home / Self Care | Payer: Self-pay | Source: Ambulatory Visit | Attending: Cardiovascular Disease | Admitting: Cardiovascular Disease

## 2014-08-16 ENCOUNTER — Encounter (HOSPITAL_COMMUNITY)
Admission: RE | Admit: 2014-08-16 | Discharge: 2014-08-16 | Disposition: A | Discharge status: Home / Self Care | Payer: Self-pay | Source: Ambulatory Visit | Attending: Cardiovascular Disease | Admitting: Cardiovascular Disease

## 2014-08-17 ENCOUNTER — Encounter (HOSPITAL_COMMUNITY): Payer: Medicare Other

## 2014-08-17 DIAGNOSIS — Z7982 Long term (current) use of aspirin: Secondary | ICD-10-CM | POA: Insufficient documentation

## 2014-08-17 DIAGNOSIS — Z8249 Family history of ischemic heart disease and other diseases of the circulatory system: Secondary | ICD-10-CM | POA: Insufficient documentation

## 2014-08-17 DIAGNOSIS — Z5189 Encounter for other specified aftercare: Secondary | ICD-10-CM | POA: Insufficient documentation

## 2014-08-17 DIAGNOSIS — I10 Essential (primary) hypertension: Secondary | ICD-10-CM | POA: Insufficient documentation

## 2014-08-17 DIAGNOSIS — I2510 Atherosclerotic heart disease of native coronary artery without angina pectoris: Secondary | ICD-10-CM | POA: Insufficient documentation

## 2014-08-17 DIAGNOSIS — E669 Obesity, unspecified: Secondary | ICD-10-CM | POA: Insufficient documentation

## 2014-08-17 DIAGNOSIS — I499 Cardiac arrhythmia, unspecified: Secondary | ICD-10-CM | POA: Insufficient documentation

## 2014-08-17 DIAGNOSIS — Z951 Presence of aortocoronary bypass graft: Secondary | ICD-10-CM | POA: Insufficient documentation

## 2014-08-17 DIAGNOSIS — Z880 Allergy status to penicillin: Secondary | ICD-10-CM | POA: Insufficient documentation

## 2014-08-17 DIAGNOSIS — E785 Hyperlipidemia, unspecified: Secondary | ICD-10-CM | POA: Insufficient documentation

## 2014-08-17 DIAGNOSIS — E870 Hyperosmolality and hypernatremia: Secondary | ICD-10-CM | POA: Insufficient documentation

## 2014-08-17 DIAGNOSIS — I4891 Unspecified atrial fibrillation: Secondary | ICD-10-CM | POA: Insufficient documentation

## 2014-08-18 ENCOUNTER — Encounter (HOSPITAL_COMMUNITY): Payer: Self-pay

## 2014-08-23 ENCOUNTER — Encounter (HOSPITAL_COMMUNITY)
Admission: RE | Admit: 2014-08-23 | Discharge: 2014-08-23 | Disposition: A | Discharge status: Home / Self Care | Payer: Self-pay | Source: Ambulatory Visit | Attending: Cardiovascular Disease | Admitting: Cardiovascular Disease

## 2014-08-24 ENCOUNTER — Encounter (HOSPITAL_COMMUNITY): Payer: Self-pay

## 2014-08-25 ENCOUNTER — Encounter (HOSPITAL_COMMUNITY): Payer: Self-pay

## 2014-08-30 ENCOUNTER — Encounter (HOSPITAL_COMMUNITY)
Admission: RE | Admit: 2014-08-30 | Discharge: 2014-08-30 | Disposition: A | Discharge status: Home / Self Care | Payer: Self-pay | Source: Ambulatory Visit | Attending: Cardiovascular Disease | Admitting: Cardiovascular Disease

## 2014-08-31 ENCOUNTER — Encounter (HOSPITAL_COMMUNITY): Payer: Self-pay

## 2014-09-01 ENCOUNTER — Encounter (HOSPITAL_COMMUNITY)
Admission: RE | Admit: 2014-09-01 | Discharge: 2014-09-01 | Disposition: A | Discharge status: Home / Self Care | Payer: Self-pay | Source: Ambulatory Visit | Attending: Cardiovascular Disease | Admitting: Cardiovascular Disease

## 2014-09-06 ENCOUNTER — Encounter (HOSPITAL_COMMUNITY): Payer: Self-pay

## 2014-09-07 ENCOUNTER — Encounter (HOSPITAL_COMMUNITY): Payer: Self-pay

## 2014-09-08 ENCOUNTER — Encounter (HOSPITAL_COMMUNITY)
Admission: RE | Admit: 2014-09-08 | Discharge: 2014-09-08 | Disposition: A | Discharge status: Home / Self Care | Payer: Self-pay | Source: Ambulatory Visit | Attending: Cardiovascular Disease | Admitting: Cardiovascular Disease

## 2014-09-13 ENCOUNTER — Encounter (HOSPITAL_COMMUNITY): Payer: Self-pay

## 2014-09-14 ENCOUNTER — Encounter (HOSPITAL_COMMUNITY): Payer: Self-pay

## 2014-09-15 ENCOUNTER — Encounter (HOSPITAL_COMMUNITY): Payer: Self-pay

## 2014-09-20 ENCOUNTER — Encounter (HOSPITAL_COMMUNITY): Payer: 59

## 2014-09-20 DIAGNOSIS — I2510 Atherosclerotic heart disease of native coronary artery without angina pectoris: Secondary | ICD-10-CM | POA: Insufficient documentation

## 2014-09-20 DIAGNOSIS — I4891 Unspecified atrial fibrillation: Secondary | ICD-10-CM | POA: Insufficient documentation

## 2014-09-20 DIAGNOSIS — E785 Hyperlipidemia, unspecified: Secondary | ICD-10-CM | POA: Insufficient documentation

## 2014-09-20 DIAGNOSIS — E669 Obesity, unspecified: Secondary | ICD-10-CM | POA: Insufficient documentation

## 2014-09-20 DIAGNOSIS — I499 Cardiac arrhythmia, unspecified: Secondary | ICD-10-CM | POA: Insufficient documentation

## 2014-09-20 DIAGNOSIS — Z8249 Family history of ischemic heart disease and other diseases of the circulatory system: Secondary | ICD-10-CM | POA: Insufficient documentation

## 2014-09-20 DIAGNOSIS — Z7982 Long term (current) use of aspirin: Secondary | ICD-10-CM | POA: Insufficient documentation

## 2014-09-20 DIAGNOSIS — Z880 Allergy status to penicillin: Secondary | ICD-10-CM | POA: Insufficient documentation

## 2014-09-20 DIAGNOSIS — Z5189 Encounter for other specified aftercare: Secondary | ICD-10-CM | POA: Insufficient documentation

## 2014-09-20 DIAGNOSIS — Z951 Presence of aortocoronary bypass graft: Secondary | ICD-10-CM | POA: Insufficient documentation

## 2014-09-20 DIAGNOSIS — E870 Hyperosmolality and hypernatremia: Secondary | ICD-10-CM | POA: Insufficient documentation

## 2014-09-20 DIAGNOSIS — I10 Essential (primary) hypertension: Secondary | ICD-10-CM | POA: Insufficient documentation

## 2014-09-21 ENCOUNTER — Encounter (HOSPITAL_COMMUNITY): Payer: 59

## 2014-09-22 ENCOUNTER — Encounter (HOSPITAL_COMMUNITY)
Admission: RE | Admit: 2014-09-22 | Discharge: 2014-09-22 | Disposition: A | Discharge status: Home / Self Care | Payer: 59 | Source: Ambulatory Visit | Attending: Cardiovascular Disease | Admitting: Cardiovascular Disease

## 2014-09-24 ENCOUNTER — Other Ambulatory Visit: Payer: Self-pay | Admitting: Cardiovascular Disease

## 2014-09-26 ENCOUNTER — Other Ambulatory Visit: Payer: Self-pay | Admitting: Cardiovascular Disease

## 2014-09-27 ENCOUNTER — Encounter (HOSPITAL_COMMUNITY)
Admission: RE | Admit: 2014-09-27 | Discharge: 2014-09-27 | Disposition: A | Discharge status: Home / Self Care | Payer: 59 | Source: Ambulatory Visit | Attending: Cardiovascular Disease | Admitting: Cardiovascular Disease

## 2014-09-28 ENCOUNTER — Encounter (HOSPITAL_COMMUNITY): Payer: 59

## 2014-09-29 ENCOUNTER — Encounter (HOSPITAL_COMMUNITY): Payer: 59

## 2014-09-30 ENCOUNTER — Encounter (HOSPITAL_COMMUNITY)
Admission: RE | Admit: 2014-09-30 | Discharge: 2014-09-30 | Disposition: A | Discharge status: Home / Self Care | Payer: Self-pay | Source: Ambulatory Visit | Attending: Cardiovascular Disease | Admitting: Cardiovascular Disease

## 2014-10-04 ENCOUNTER — Encounter (HOSPITAL_COMMUNITY): Payer: 59

## 2014-10-05 ENCOUNTER — Encounter (HOSPITAL_COMMUNITY): Payer: 59

## 2014-10-06 ENCOUNTER — Encounter (HOSPITAL_COMMUNITY)
Admission: RE | Admit: 2014-10-06 | Discharge: 2014-10-06 | Disposition: A | Discharge status: Home / Self Care | Payer: Self-pay | Source: Ambulatory Visit | Attending: Cardiovascular Disease | Admitting: Cardiovascular Disease

## 2014-10-11 ENCOUNTER — Encounter (HOSPITAL_COMMUNITY): Payer: 59

## 2014-10-12 ENCOUNTER — Encounter (HOSPITAL_COMMUNITY): Payer: 59

## 2014-10-13 ENCOUNTER — Encounter (HOSPITAL_COMMUNITY)
Admission: RE | Admit: 2014-10-13 | Discharge: 2014-10-13 | Disposition: A | Discharge status: Home / Self Care | Payer: Self-pay | Source: Ambulatory Visit | Attending: Cardiovascular Disease | Admitting: Cardiovascular Disease

## 2014-10-18 ENCOUNTER — Encounter (HOSPITAL_COMMUNITY)
Admission: RE | Admit: 2014-10-18 | Discharge: 2014-10-18 | Disposition: A | Discharge status: Home / Self Care | Payer: Self-pay | Source: Ambulatory Visit | Attending: Cardiovascular Disease | Admitting: Cardiovascular Disease

## 2014-10-18 DIAGNOSIS — Z48812 Encounter for surgical aftercare following surgery on the circulatory system: Secondary | ICD-10-CM | POA: Insufficient documentation

## 2014-10-18 DIAGNOSIS — Z951 Presence of aortocoronary bypass graft: Secondary | ICD-10-CM | POA: Insufficient documentation

## 2014-10-19 ENCOUNTER — Encounter (HOSPITAL_COMMUNITY): Payer: Self-pay

## 2014-10-20 ENCOUNTER — Encounter (HOSPITAL_COMMUNITY)
Admission: RE | Admit: 2014-10-20 | Discharge: 2014-10-20 | Disposition: A | Discharge status: Home / Self Care | Payer: Self-pay | Source: Ambulatory Visit | Attending: Cardiovascular Disease | Admitting: Cardiovascular Disease

## 2014-10-25 ENCOUNTER — Encounter (HOSPITAL_COMMUNITY)
Admission: RE | Admit: 2014-10-25 | Discharge: 2014-10-25 | Disposition: A | Discharge status: Home / Self Care | Payer: Self-pay | Source: Ambulatory Visit | Attending: Cardiovascular Disease | Admitting: Cardiovascular Disease

## 2014-10-26 ENCOUNTER — Encounter (HOSPITAL_COMMUNITY): Payer: Self-pay

## 2014-10-27 ENCOUNTER — Encounter (HOSPITAL_COMMUNITY): Payer: Self-pay

## 2014-11-01 ENCOUNTER — Encounter (HOSPITAL_COMMUNITY)
Admission: RE | Admit: 2014-11-01 | Discharge: 2014-11-01 | Disposition: A | Discharge status: Home / Self Care | Payer: Self-pay | Source: Ambulatory Visit | Attending: Cardiovascular Disease | Admitting: Cardiovascular Disease

## 2014-11-02 ENCOUNTER — Encounter (HOSPITAL_COMMUNITY): Payer: Self-pay

## 2014-11-03 ENCOUNTER — Encounter (HOSPITAL_COMMUNITY)
Admission: RE | Admit: 2014-11-03 | Discharge: 2014-11-03 | Disposition: A | Discharge status: Home / Self Care | Payer: Self-pay | Source: Ambulatory Visit | Attending: Cardiovascular Disease | Admitting: Cardiovascular Disease

## 2014-11-08 ENCOUNTER — Encounter (HOSPITAL_COMMUNITY): Payer: Self-pay

## 2014-11-09 ENCOUNTER — Encounter (HOSPITAL_COMMUNITY): Payer: Self-pay

## 2014-11-10 ENCOUNTER — Encounter (HOSPITAL_COMMUNITY)
Admission: RE | Admit: 2014-11-10 | Discharge: 2014-11-10 | Disposition: A | Discharge status: Home / Self Care | Payer: Self-pay | Source: Ambulatory Visit | Attending: Cardiovascular Disease | Admitting: Cardiovascular Disease

## 2014-11-11 ENCOUNTER — Encounter (HOSPITAL_COMMUNITY)
Admission: RE | Admit: 2014-11-11 | Discharge: 2014-11-11 | Disposition: A | Discharge status: Home / Self Care | Payer: Self-pay | Source: Ambulatory Visit | Attending: Cardiovascular Disease | Admitting: Cardiovascular Disease

## 2014-11-15 ENCOUNTER — Encounter (HOSPITAL_COMMUNITY)
Admission: RE | Admit: 2014-11-15 | Discharge: 2014-11-15 | Disposition: A | Discharge status: Home / Self Care | Payer: Self-pay | Source: Ambulatory Visit | Attending: Cardiovascular Disease | Admitting: Cardiovascular Disease

## 2014-11-16 ENCOUNTER — Encounter (HOSPITAL_COMMUNITY): Payer: Self-pay

## 2014-11-17 ENCOUNTER — Encounter (HOSPITAL_COMMUNITY): Payer: Medicare Other

## 2014-11-17 DIAGNOSIS — Z48812 Encounter for surgical aftercare following surgery on the circulatory system: Secondary | ICD-10-CM | POA: Insufficient documentation

## 2014-11-17 DIAGNOSIS — Z951 Presence of aortocoronary bypass graft: Secondary | ICD-10-CM | POA: Insufficient documentation

## 2014-11-22 ENCOUNTER — Encounter (HOSPITAL_COMMUNITY): Payer: Self-pay

## 2014-11-23 ENCOUNTER — Encounter (HOSPITAL_COMMUNITY): Payer: Self-pay

## 2014-11-24 ENCOUNTER — Encounter (HOSPITAL_COMMUNITY)
Admission: RE | Admit: 2014-11-24 | Discharge: 2014-11-24 | Disposition: A | Discharge status: Home / Self Care | Payer: Self-pay | Source: Ambulatory Visit | Attending: Cardiovascular Disease | Admitting: Cardiovascular Disease

## 2014-11-29 ENCOUNTER — Encounter (HOSPITAL_COMMUNITY)
Admission: RE | Admit: 2014-11-29 | Discharge: 2014-11-29 | Disposition: A | Discharge status: Home / Self Care | Payer: Self-pay | Source: Ambulatory Visit | Attending: Cardiovascular Disease | Admitting: Cardiovascular Disease

## 2014-11-30 ENCOUNTER — Encounter (HOSPITAL_COMMUNITY): Payer: Self-pay

## 2014-12-01 ENCOUNTER — Encounter (HOSPITAL_COMMUNITY): Payer: Self-pay

## 2014-12-06 ENCOUNTER — Encounter (HOSPITAL_COMMUNITY)
Admission: RE | Admit: 2014-12-06 | Discharge: 2014-12-06 | Disposition: A | Discharge status: Home / Self Care | Payer: Self-pay | Source: Ambulatory Visit | Attending: Cardiovascular Disease | Admitting: Cardiovascular Disease

## 2014-12-07 ENCOUNTER — Encounter (HOSPITAL_COMMUNITY): Payer: Self-pay

## 2014-12-08 ENCOUNTER — Encounter (HOSPITAL_COMMUNITY)
Admission: RE | Admit: 2014-12-08 | Discharge: 2014-12-08 | Disposition: A | Discharge status: Home / Self Care | Payer: Self-pay | Source: Ambulatory Visit | Attending: Cardiovascular Disease | Admitting: Cardiovascular Disease

## 2014-12-13 ENCOUNTER — Encounter (HOSPITAL_COMMUNITY)
Admission: RE | Admit: 2014-12-13 | Discharge: 2014-12-13 | Disposition: A | Discharge status: Home / Self Care | Payer: Self-pay | Source: Ambulatory Visit | Attending: Cardiovascular Disease | Admitting: Cardiovascular Disease

## 2014-12-14 ENCOUNTER — Encounter (HOSPITAL_COMMUNITY): Payer: Self-pay

## 2014-12-15 ENCOUNTER — Encounter (HOSPITAL_COMMUNITY)
Admission: RE | Admit: 2014-12-15 | Discharge: 2014-12-15 | Disposition: A | Discharge status: Home / Self Care | Payer: Self-pay | Source: Ambulatory Visit | Attending: Cardiovascular Disease | Admitting: Cardiovascular Disease

## 2014-12-20 ENCOUNTER — Encounter (HOSPITAL_COMMUNITY)
Admission: RE | Admit: 2014-12-20 | Discharge: 2014-12-20 | Disposition: A | Discharge status: Home / Self Care | Payer: Self-pay | Source: Ambulatory Visit | Attending: Cardiovascular Disease | Admitting: Cardiovascular Disease

## 2014-12-20 DIAGNOSIS — Z951 Presence of aortocoronary bypass graft: Secondary | ICD-10-CM | POA: Insufficient documentation

## 2014-12-20 DIAGNOSIS — Z48812 Encounter for surgical aftercare following surgery on the circulatory system: Secondary | ICD-10-CM | POA: Insufficient documentation

## 2014-12-21 ENCOUNTER — Encounter (HOSPITAL_COMMUNITY): Payer: Self-pay

## 2014-12-22 ENCOUNTER — Encounter (HOSPITAL_COMMUNITY): Payer: Self-pay

## 2014-12-27 ENCOUNTER — Encounter (HOSPITAL_COMMUNITY): Payer: Self-pay

## 2014-12-28 ENCOUNTER — Encounter (HOSPITAL_COMMUNITY): Payer: Self-pay

## 2014-12-29 ENCOUNTER — Encounter (HOSPITAL_COMMUNITY): Payer: Self-pay

## 2015-01-03 ENCOUNTER — Encounter (HOSPITAL_COMMUNITY)
Admission: RE | Admit: 2015-01-03 | Discharge: 2015-01-03 | Disposition: A | Discharge status: Home / Self Care | Payer: Self-pay | Source: Ambulatory Visit | Attending: Cardiovascular Disease | Admitting: Cardiovascular Disease

## 2015-01-04 ENCOUNTER — Encounter (HOSPITAL_COMMUNITY): Payer: Self-pay

## 2015-01-05 ENCOUNTER — Encounter (HOSPITAL_COMMUNITY)
Admission: RE | Admit: 2015-01-05 | Discharge: 2015-01-05 | Disposition: A | Discharge status: Home / Self Care | Payer: Self-pay | Source: Ambulatory Visit | Attending: Cardiovascular Disease | Admitting: Cardiovascular Disease

## 2015-01-10 ENCOUNTER — Encounter (HOSPITAL_COMMUNITY)
Admission: RE | Admit: 2015-01-10 | Discharge: 2015-01-10 | Disposition: A | Discharge status: Home / Self Care | Payer: Self-pay | Source: Ambulatory Visit | Attending: Cardiovascular Disease | Admitting: Cardiovascular Disease

## 2015-01-11 ENCOUNTER — Encounter (HOSPITAL_COMMUNITY): Payer: Self-pay

## 2015-01-12 ENCOUNTER — Encounter (HOSPITAL_COMMUNITY): Payer: Self-pay

## 2015-01-17 ENCOUNTER — Other Ambulatory Visit (INDEPENDENT_AMBULATORY_CARE_PROVIDER_SITE_OTHER): Payer: Medicare Other | Admitting: *Deleted

## 2015-01-17 ENCOUNTER — Encounter (HOSPITAL_COMMUNITY)
Admission: RE | Admit: 2015-01-17 | Discharge: 2015-01-17 | Disposition: A | Discharge status: Home / Self Care | Payer: Self-pay | Source: Ambulatory Visit | Attending: Cardiovascular Disease | Admitting: Cardiovascular Disease

## 2015-01-17 ENCOUNTER — Encounter: Payer: Self-pay | Admitting: Cardiovascular Disease

## 2015-01-17 ENCOUNTER — Ambulatory Visit (INDEPENDENT_AMBULATORY_CARE_PROVIDER_SITE_OTHER): Payer: Medicare Other | Admitting: Cardiovascular Disease

## 2015-01-17 VITALS — BP 142/86 | HR 51 | Ht 67.0 in | Wt 191.0 lb

## 2015-01-17 DIAGNOSIS — Z951 Presence of aortocoronary bypass graft: Secondary | ICD-10-CM | POA: Insufficient documentation

## 2015-01-17 DIAGNOSIS — F419 Anxiety disorder, unspecified: Secondary | ICD-10-CM

## 2015-01-17 DIAGNOSIS — I2510 Atherosclerotic heart disease of native coronary artery without angina pectoris: Secondary | ICD-10-CM

## 2015-01-17 DIAGNOSIS — E785 Hyperlipidemia, unspecified: Secondary | ICD-10-CM | POA: Diagnosis not present

## 2015-01-17 DIAGNOSIS — Z48812 Encounter for surgical aftercare following surgery on the circulatory system: Secondary | ICD-10-CM | POA: Insufficient documentation

## 2015-01-17 LAB — LIPID PANEL
CHOL/HDL RATIO: 2.4 ratio (ref <=5.0)
CHOLESTEROL: 116 mg/dL — AB (ref 125–200)
HDL: 48 mg/dL (ref >=40)
LDL CALC: 54 mg/dL (ref <130)
TRIGLYCERIDES: 72 mg/dL (ref <150)
VLDL: 14 mg/dL (ref <30)

## 2015-01-17 LAB — HEPATIC FUNCTION PANEL
ALBUMIN: 4 g/dL (ref 3.6–5.1)
ALT: 22 U/L (ref 9–46)
AST: 17 U/L (ref 10–35)
Alkaline Phosphatase: 45 U/L (ref 40–115)
Bilirubin, Direct: 0.3 mg/dL — ABNORMAL HIGH (ref <=0.2)
Indirect Bilirubin: 1.1 mg/dL (ref 0.2–1.2)
TOTAL PROTEIN: 6.7 g/dL (ref 6.1–8.1)
Total Bilirubin: 1.4 mg/dL — ABNORMAL HIGH (ref 0.2–1.2)

## 2015-01-17 LAB — BASIC METABOLIC PANEL
BUN: 15 mg/dL (ref 7–25)
CHLORIDE: 101 mmol/L (ref 98–110)
CO2: 28 mmol/L (ref 20–31)
Calcium: 9.5 mg/dL (ref 8.6–10.3)
Creat: 0.96 mg/dL (ref 0.70–1.18)
Glucose, Bld: 121 mg/dL — ABNORMAL HIGH (ref 65–99)
POTASSIUM: 4.6 mmol/L (ref 3.5–5.3)
SODIUM: 136 mmol/L (ref 135–146)

## 2015-01-17 NOTE — Progress Notes (Signed)
Cardiology Office Note   Date:  01/17/2015   ID:  William Hernandez, DOB Feb 19, 1946, MRN [id number]  PCP:  Kaleen Mask, MD  Cardiologist:   Vesta Mixer, MD   Chief Complaint  Patient presents with  . Coronary Artery Disease   1. CAD ( CABG Oct. 6, 2003)  2. Dyslipidemia 3. Atrial fibrillation - post op from CABG.  4. Depression  History of Present Illness:  William Hernandez is a 69 year old gentleman with a history of coronary disease-status post coronary artery bypass grafting. He's done very well from a cardiac standpoint. He is participating in cardiac rehabilitation and has not had any episodes of chest pain or shortness breath.  He has done well. His lipids are well controlled.   He is participating in cardiac rehab and is doing well. 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 He doesn't have any episodes of chest pain or shortness breath when he is active. 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  He continues to have issues with anxiety.   July 14, 2012:  William Hernandez is doing well. He had a nose bleed several weeks ago. He is exercising regularly. No angina. No dyspnea.  Oct. 27, 2014:  He is doing well. 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 2 very hard games of 3 on 3 , half court game. No pain. Active. Exercises. Increased his Trazadone to help with some additional depression.   July 14 2013:  William Hernandez is doing well. He remains very active. Plays in the Senior games. No CP. Has has a URI for the past 10 days which has slowed him down a bit.   Oct. 26, 2015:  William Hernandez is doing well.  Bp is well controlled. Doing lots of fishing. Plays basketball on Wednesdays. No Cp .      Jul 21, 2014:  William Hernandez is a 69 y.o. male who presents for follow up of his CAD and hyperlipidemia Still exercising well.   Still playing basketball regularly.  No angina     01/17/2015: Doing well from a cardiac standpoint. Has had a head cold - difficulty hearing in his left ear     Past Medical History  Diagnosis Date  . Hyperlipidemia   . Arrhythmia     afib  . Depression   . Coronary artery disease     2003  . S/P hernia surgery     Past Surgical History  Procedure Laterality Date  . Hernia repair    . Cholecystectomy    . Cardiac catheterization      11/2001  . Coronary artery bypass graft  2000     Current Outpatient Prescriptions  Medication Sig Dispense Refill  . aspirin 81 MG tablet Take 81 mg by mouth daily.    Marland Kitchen atorvastatin (LIPITOR) 20 MG tablet TAKE 1 TABLET BY MOUTH ONCE DAILY 90 tablet 3  . BYSTOLIC 5 MG tablet TAKE 1 TABLET BY MOUTH DAILY. 30 tablet 11  . fish oil-omega-3 fatty acids 1000 MG capsule Take 1 g by mouth daily.     . Flaxseed, Linseed, (FLAX SEED OIL PO) Take 1 tablet by mouth daily.     Marland Kitchen FLUZONE HIGH-DOSE 0.5 ML SUSY Inject as directed once.  0  . irbesartan (AVAPRO) 150 MG tablet TAKE 1/2 TABLET BY MOUTH AT BEDTIME. 45 tablet 2  . Multiple Vitamin (MULTIVITAMIN PO) Take by mouth daily.      Marland Kitchen  ranitidine (ZANTAC) 150 MG capsule Take 150 mg by mouth every evening.      . sildenafil (REVATIO) 20 MG tablet Use 1 to 5 pills at a time as needed (Patient taking differently: Take 20 mg by mouth 3 (three) times daily. Use 1 to 5 pills at a time as needed) 30 tablet 5  . TRAZODONE HCL PO Take 200 mg by mouth at bedtime as needed (AS NEEDED FOR SLEEP).      No current facility-administered medications for this visit.    Allergies:   Lisinopril and Penicillins    Social History:  The patient  reports that he has never smoked. He does not have any smokeless tobacco history on file. He reports that he does not drink alcohol or use illicit drugs.   Family History:  The patient's family history includes Heart disease in his father.    ROS:  Please see the history of present illness.    Review of  Systems: Constitutional:  denies fever, chills, diaphoresis, appetite change and fatigue.  HEENT: denies photophobia, eye pain, redness, hearing loss, ear pain, congestion, sore throat, rhinorrhea, sneezing, neck pain, neck stiffness and tinnitus.  Respiratory: denies SOB, DOE, cough, chest tightness, and wheezing.  Cardiovascular: denies chest pain, palpitations and leg swelling.  Gastrointestinal: denies nausea, vomiting, abdominal pain, diarrhea, constipation, blood in stool.  Genitourinary: denies dysuria, urgency, frequency, hematuria, flank pain and difficulty urinating.  Musculoskeletal: denies  myalgias, back pain, joint swelling, arthralgias and gait problem.   Skin: denies pallor, rash and wound.  Neurological: denies dizziness, seizures, syncope, weakness, light-headedness, numbness and headaches.   Hematological: denies adenopathy, easy bruising, personal or family bleeding history.  Psychiatric/ Behavioral: denies suicidal ideation, mood changes, confusion, nervousness, sleep disturbance and agitation.       All other systems are reviewed and negative.    PHYSICAL EXAM: VS:  BP 142/86 mmHg  Pulse 51  Ht 5\' 7"  (1.702 m)  Wt 191 lb (86.637 kg)  BMI 29.91 kg/m2  SpO2 99% , BMI Body mass index is 29.91 kg/(m^2). GEN: Well nourished, well developed, in no acute distress HEENT: normal Neck: no JVD, carotid bruits, or masses Cardiac: RRR; no murmurs, rubs, or gallops,no edema  Respiratory:  clear to auscultation bilaterally, normal work of breathing GI: soft, nontender, nondistended, + BS MS: no deformity or atrophy Skin: warm and dry, no rash Neuro:  Strength and sensation are intact Psych: normal   EKG:  EKG is ordered today. The ekg ordered today demonstrates sinus brady at 54.     Recent Labs: 07/21/2014: ALT 20; BUN 13; Creatinine, Ser 0.88; Potassium 4.3; Sodium 136    Lipid Panel    Component Value Date/Time   CHOL 115 07/21/2014 0803   TRIG 66.0  07/21/2014 0803   HDL 48.30 07/21/2014 0803   CHOLHDL 2 07/21/2014 0803   VLDL 13.2 07/21/2014 0803   LDLCALC 54 07/21/2014 0803      Wt Readings from Last 3 Encounters:  01/17/15 191 lb (86.637 kg)  07/21/14 191 lb 3.2 oz (86.728 kg)  01/10/14 189 lb (85.73 kg)      Other studies Reviewed: Additional studies/ records that were reviewed today include: . Review of the above records demonstrates:    ASSESSMENT AND PLAN:  1. CAD ( CABG Oct. 6, 2003)  - doing well.  No angina.  He is very active.    2. Dyslipidemia- doing well. Continue same meds,  check labs today.  3. Atrial fibrillation - occurred  following CABG.  there will current atrial fibrillation.  4. Depression - he sees Becky Kincaid,MS  5. HTN:  BP is well controlled.   Current medicines are reviewed at length with the patient today.  The patient does not have concerns regarding medicines.  The following changes have been made:  no change  Labs/ tests ordered today include:  No orders of the defined types were placed in this encounter.     Disposition:   FU with me in 6 months       Nahser, Deloris Ping, MD  01/17/2015 8:54 AM    [facility] [address] [place], [place], [place]  [address] Phone: [phone]; Fax: [phone]   [facility]  [address] [place], [place]  [address] [phone]    Fax [phone]

## 2015-01-17 NOTE — Patient Instructions (Signed)
Medication Instructions:  Your physician recommends that you continue on your current medications as directed. Please refer to the Current Medication list given to you today.   Labwork: TODAY - cholesterol, liver, basic metabolic panel   Testing/Procedures: None Ordered   Follow-Up: Your physician wants you to follow-up in: 6 months with Dr. Nahser.  You will receive a reminder letter in the mail two months in advance. If you don't receive a letter, please call our office to schedule the follow-up appointment.   If you need a refill on your cardiac medications before your next appointment, please call your pharmacy.   Thank you for choosing [facility]! Jayen Bromwell, RN [phone]   

## 2015-01-17 NOTE — Addendum Note (Signed)
Addended by: Tonita PhoenixBOWDEN, Adrienne Delay K on: 01/17/2015 08:46 AM   Modules accepted: Orders

## 2015-01-18 ENCOUNTER — Encounter (HOSPITAL_COMMUNITY): Payer: Medicare Other

## 2015-01-19 ENCOUNTER — Encounter (HOSPITAL_COMMUNITY): Payer: Self-pay

## 2015-01-24 ENCOUNTER — Encounter (HOSPITAL_COMMUNITY): Payer: Self-pay

## 2015-01-25 ENCOUNTER — Encounter (HOSPITAL_COMMUNITY): Payer: Self-pay

## 2015-01-26 ENCOUNTER — Encounter (HOSPITAL_COMMUNITY): Payer: Self-pay

## 2015-01-31 ENCOUNTER — Encounter (HOSPITAL_COMMUNITY)
Admission: RE | Admit: 2015-01-31 | Discharge: 2015-01-31 | Disposition: A | Discharge status: Home / Self Care | Payer: Self-pay | Source: Ambulatory Visit | Attending: Cardiovascular Disease | Admitting: Cardiovascular Disease

## 2015-02-01 ENCOUNTER — Encounter (HOSPITAL_COMMUNITY): Payer: Self-pay

## 2015-02-02 ENCOUNTER — Encounter (HOSPITAL_COMMUNITY): Payer: Self-pay

## 2015-02-07 ENCOUNTER — Encounter (HOSPITAL_COMMUNITY)
Admission: RE | Admit: 2015-02-07 | Discharge: 2015-02-07 | Disposition: A | Discharge status: Home / Self Care | Payer: Self-pay | Source: Ambulatory Visit | Attending: Cardiovascular Disease | Admitting: Cardiovascular Disease

## 2015-02-08 ENCOUNTER — Encounter (HOSPITAL_COMMUNITY): Payer: Self-pay

## 2015-02-14 ENCOUNTER — Encounter (HOSPITAL_COMMUNITY)
Admission: RE | Admit: 2015-02-14 | Discharge: 2015-02-14 | Disposition: A | Discharge status: Home / Self Care | Payer: Self-pay | Source: Ambulatory Visit | Attending: Cardiovascular Disease | Admitting: Cardiovascular Disease

## 2015-02-15 ENCOUNTER — Encounter (HOSPITAL_COMMUNITY): Payer: Self-pay

## 2015-02-16 ENCOUNTER — Encounter (HOSPITAL_COMMUNITY): Payer: Self-pay

## 2015-02-16 DIAGNOSIS — Z951 Presence of aortocoronary bypass graft: Secondary | ICD-10-CM | POA: Insufficient documentation

## 2015-02-16 DIAGNOSIS — Z48812 Encounter for surgical aftercare following surgery on the circulatory system: Secondary | ICD-10-CM | POA: Insufficient documentation

## 2015-02-21 ENCOUNTER — Encounter (HOSPITAL_COMMUNITY)
Admission: RE | Admit: 2015-02-21 | Discharge: 2015-02-21 | Disposition: A | Discharge status: Home / Self Care | Payer: Self-pay | Source: Ambulatory Visit | Attending: Cardiovascular Disease | Admitting: Cardiovascular Disease

## 2015-02-22 ENCOUNTER — Encounter (HOSPITAL_COMMUNITY): Payer: Self-pay

## 2015-02-23 ENCOUNTER — Encounter (HOSPITAL_COMMUNITY)
Admission: RE | Admit: 2015-02-23 | Discharge: 2015-02-23 | Disposition: A | Discharge status: Home / Self Care | Payer: Self-pay | Source: Ambulatory Visit | Attending: Cardiovascular Disease | Admitting: Cardiovascular Disease

## 2015-02-28 ENCOUNTER — Encounter (HOSPITAL_COMMUNITY): Payer: Self-pay

## 2015-03-01 ENCOUNTER — Encounter (HOSPITAL_COMMUNITY): Payer: Self-pay

## 2015-03-02 ENCOUNTER — Encounter (HOSPITAL_COMMUNITY)
Admission: RE | Admit: 2015-03-02 | Discharge: 2015-03-02 | Disposition: A | Discharge status: Home / Self Care | Payer: Self-pay | Source: Ambulatory Visit | Attending: Cardiovascular Disease | Admitting: Cardiovascular Disease

## 2015-03-07 ENCOUNTER — Encounter (HOSPITAL_COMMUNITY)
Admission: RE | Admit: 2015-03-07 | Discharge: 2015-03-07 | Disposition: A | Discharge status: Home / Self Care | Payer: Self-pay | Source: Ambulatory Visit | Attending: Cardiovascular Disease | Admitting: Cardiovascular Disease

## 2015-03-08 ENCOUNTER — Encounter (HOSPITAL_COMMUNITY): Payer: Self-pay

## 2015-03-09 ENCOUNTER — Encounter (HOSPITAL_COMMUNITY): Payer: Self-pay

## 2015-03-14 ENCOUNTER — Encounter (HOSPITAL_COMMUNITY): Payer: Self-pay

## 2015-03-15 ENCOUNTER — Encounter (HOSPITAL_COMMUNITY): Payer: Self-pay

## 2015-03-16 ENCOUNTER — Encounter (HOSPITAL_COMMUNITY)
Admission: RE | Admit: 2015-03-16 | Discharge: 2015-03-16 | Disposition: A | Discharge status: Home / Self Care | Payer: Self-pay | Source: Ambulatory Visit | Attending: Cardiovascular Disease | Admitting: Cardiovascular Disease

## 2015-03-21 ENCOUNTER — Encounter (HOSPITAL_COMMUNITY)
Admission: RE | Admit: 2015-03-21 | Discharge: 2015-03-21 | Disposition: A | Discharge status: Home / Self Care | Payer: Self-pay | Source: Ambulatory Visit | Attending: Cardiovascular Disease | Admitting: Cardiovascular Disease

## 2015-03-21 DIAGNOSIS — Z951 Presence of aortocoronary bypass graft: Secondary | ICD-10-CM | POA: Insufficient documentation

## 2015-03-21 DIAGNOSIS — Z48812 Encounter for surgical aftercare following surgery on the circulatory system: Secondary | ICD-10-CM | POA: Insufficient documentation

## 2015-03-22 ENCOUNTER — Encounter (HOSPITAL_COMMUNITY): Payer: Self-pay

## 2015-03-23 ENCOUNTER — Encounter (HOSPITAL_COMMUNITY)
Admission: RE | Admit: 2015-03-23 | Discharge: 2015-03-23 | Disposition: A | Discharge status: Home / Self Care | Payer: Self-pay | Source: Ambulatory Visit | Attending: Cardiovascular Disease | Admitting: Cardiovascular Disease

## 2015-03-28 ENCOUNTER — Encounter (HOSPITAL_COMMUNITY): Payer: Self-pay

## 2015-03-29 ENCOUNTER — Encounter (HOSPITAL_COMMUNITY): Payer: Self-pay

## 2015-03-30 ENCOUNTER — Encounter (HOSPITAL_COMMUNITY)
Admission: RE | Admit: 2015-03-30 | Discharge: 2015-03-30 | Disposition: A | Discharge status: Home / Self Care | Payer: Self-pay | Source: Ambulatory Visit | Attending: Cardiovascular Disease | Admitting: Cardiovascular Disease

## 2015-04-04 ENCOUNTER — Encounter (HOSPITAL_COMMUNITY)
Admission: RE | Admit: 2015-04-04 | Discharge: 2015-04-04 | Disposition: A | Discharge status: Home / Self Care | Payer: Self-pay | Source: Ambulatory Visit | Attending: Cardiovascular Disease | Admitting: Cardiovascular Disease

## 2015-04-05 ENCOUNTER — Encounter (HOSPITAL_COMMUNITY): Payer: Self-pay

## 2015-04-06 ENCOUNTER — Encounter (HOSPITAL_COMMUNITY): Payer: Self-pay

## 2015-04-11 ENCOUNTER — Encounter (HOSPITAL_COMMUNITY)
Admission: RE | Admit: 2015-04-11 | Discharge: 2015-04-11 | Disposition: A | Discharge status: Home / Self Care | Payer: Self-pay | Source: Ambulatory Visit | Attending: Cardiovascular Disease | Admitting: Cardiovascular Disease

## 2015-04-12 ENCOUNTER — Encounter (HOSPITAL_COMMUNITY): Payer: Self-pay

## 2015-04-13 ENCOUNTER — Encounter (HOSPITAL_COMMUNITY): Payer: Self-pay

## 2015-04-18 ENCOUNTER — Encounter (HOSPITAL_COMMUNITY)
Admission: RE | Admit: 2015-04-18 | Discharge: 2015-04-18 | Disposition: A | Discharge status: Home / Self Care | Payer: Self-pay | Source: Ambulatory Visit | Attending: Cardiovascular Disease | Admitting: Cardiovascular Disease

## 2015-04-19 ENCOUNTER — Encounter (HOSPITAL_COMMUNITY): Payer: Self-pay

## 2015-04-19 DIAGNOSIS — Z951 Presence of aortocoronary bypass graft: Secondary | ICD-10-CM | POA: Insufficient documentation

## 2015-04-20 ENCOUNTER — Encounter (HOSPITAL_COMMUNITY)
Admission: RE | Admit: 2015-04-20 | Discharge: 2015-04-20 | Disposition: A | Discharge status: Home / Self Care | Payer: Self-pay | Source: Ambulatory Visit | Attending: Cardiovascular Disease | Admitting: Cardiovascular Disease

## 2015-04-25 ENCOUNTER — Encounter (HOSPITAL_COMMUNITY)
Admission: RE | Admit: 2015-04-25 | Discharge: 2015-04-25 | Disposition: A | Discharge status: Home / Self Care | Payer: Self-pay | Source: Ambulatory Visit | Attending: Cardiovascular Disease | Admitting: Cardiovascular Disease

## 2015-04-26 ENCOUNTER — Encounter (HOSPITAL_COMMUNITY): Payer: Self-pay

## 2015-04-27 ENCOUNTER — Encounter (HOSPITAL_COMMUNITY): Payer: Self-pay

## 2015-05-02 ENCOUNTER — Encounter (HOSPITAL_COMMUNITY)
Admission: RE | Admit: 2015-05-02 | Discharge: 2015-05-02 | Disposition: A | Discharge status: Home / Self Care | Payer: Self-pay | Source: Ambulatory Visit | Attending: Cardiovascular Disease | Admitting: Cardiovascular Disease

## 2015-05-03 ENCOUNTER — Encounter (HOSPITAL_COMMUNITY): Payer: Self-pay

## 2015-05-04 ENCOUNTER — Encounter (HOSPITAL_COMMUNITY)
Admission: RE | Admit: 2015-05-04 | Discharge: 2015-05-04 | Disposition: A | Discharge status: Home / Self Care | Payer: Self-pay | Source: Ambulatory Visit | Attending: Cardiovascular Disease | Admitting: Cardiovascular Disease

## 2015-05-05 ENCOUNTER — Telehealth: Payer: Self-pay | Admitting: Cardiovascular Disease

## 2015-05-05 DIAGNOSIS — E785 Hyperlipidemia, unspecified: Secondary | ICD-10-CM

## 2015-05-05 DIAGNOSIS — Z0000 Encounter for general adult medical examination without abnormal findings: Secondary | ICD-10-CM

## 2015-05-05 DIAGNOSIS — Z139 Encounter for screening, unspecified: Secondary | ICD-10-CM

## 2015-05-05 DIAGNOSIS — Z79899 Other long term (current) drug therapy: Secondary | ICD-10-CM

## 2015-05-05 NOTE — Telephone Encounter (Signed)
New message    Patient has appt on  4.3.2017 wants labs to be drawn A1C & PSA .

## 2015-05-05 NOTE — Telephone Encounter (Signed)
Ok to draw a PSA and HbA1C

## 2015-05-05 NOTE — Telephone Encounter (Signed)
Left message to call back  

## 2015-05-08 NOTE — Telephone Encounter (Signed)
LM TO CALL BACK ./CY 

## 2015-05-09 ENCOUNTER — Encounter (HOSPITAL_COMMUNITY): Payer: Self-pay

## 2015-05-09 NOTE — Telephone Encounter (Signed)
Returning your call from yesterday. °

## 2015-05-10 ENCOUNTER — Encounter (HOSPITAL_COMMUNITY): Payer: Self-pay

## 2015-05-10 NOTE — Telephone Encounter (Signed)
PT  NOTIFIED  WILL COME IN  06-16-15 FOR  FASTING  LABS .Zack Seal

## 2015-05-11 ENCOUNTER — Encounter (HOSPITAL_COMMUNITY)
Admission: RE | Admit: 2015-05-11 | Discharge: 2015-05-11 | Disposition: A | Discharge status: Home / Self Care | Payer: Self-pay | Source: Ambulatory Visit | Attending: Cardiovascular Disease | Admitting: Cardiovascular Disease

## 2015-05-16 ENCOUNTER — Encounter (HOSPITAL_COMMUNITY)
Admission: RE | Admit: 2015-05-16 | Discharge: 2015-05-16 | Disposition: A | Discharge status: Home / Self Care | Payer: Self-pay | Source: Ambulatory Visit | Attending: Cardiovascular Disease | Admitting: Cardiovascular Disease

## 2015-05-17 ENCOUNTER — Encounter (HOSPITAL_COMMUNITY): Payer: Self-pay

## 2015-05-17 DIAGNOSIS — Z951 Presence of aortocoronary bypass graft: Secondary | ICD-10-CM | POA: Insufficient documentation

## 2015-05-18 ENCOUNTER — Encounter (HOSPITAL_COMMUNITY): Payer: Self-pay

## 2015-05-23 ENCOUNTER — Encounter (HOSPITAL_COMMUNITY)
Admission: RE | Admit: 2015-05-23 | Discharge: 2015-05-23 | Disposition: A | Discharge status: Home / Self Care | Payer: Self-pay | Source: Ambulatory Visit | Attending: Cardiovascular Disease | Admitting: Cardiovascular Disease

## 2015-05-24 ENCOUNTER — Encounter (HOSPITAL_COMMUNITY): Payer: Self-pay

## 2015-05-25 ENCOUNTER — Encounter (HOSPITAL_COMMUNITY): Payer: Self-pay

## 2015-05-30 ENCOUNTER — Encounter (HOSPITAL_COMMUNITY)
Admission: RE | Admit: 2015-05-30 | Discharge: 2015-05-30 | Disposition: A | Discharge status: Home / Self Care | Payer: Self-pay | Source: Ambulatory Visit | Attending: Cardiovascular Disease | Admitting: Cardiovascular Disease

## 2015-05-31 ENCOUNTER — Encounter (HOSPITAL_COMMUNITY): Payer: Self-pay

## 2015-06-01 ENCOUNTER — Encounter (HOSPITAL_COMMUNITY): Payer: Self-pay

## 2015-06-06 ENCOUNTER — Encounter (HOSPITAL_COMMUNITY)
Admission: RE | Admit: 2015-06-06 | Discharge: 2015-06-06 | Disposition: A | Discharge status: Home / Self Care | Payer: Self-pay | Source: Ambulatory Visit | Attending: Cardiovascular Disease | Admitting: Cardiovascular Disease

## 2015-06-07 ENCOUNTER — Encounter (HOSPITAL_COMMUNITY): Payer: Self-pay

## 2015-06-08 ENCOUNTER — Encounter (HOSPITAL_COMMUNITY): Payer: Self-pay

## 2015-06-13 ENCOUNTER — Encounter (HOSPITAL_COMMUNITY): Payer: Self-pay

## 2015-06-14 ENCOUNTER — Encounter (HOSPITAL_COMMUNITY): Payer: Self-pay

## 2015-06-15 ENCOUNTER — Encounter (HOSPITAL_COMMUNITY)
Admission: RE | Admit: 2015-06-15 | Discharge: 2015-06-15 | Disposition: A | Discharge status: Home / Self Care | Payer: Self-pay | Source: Ambulatory Visit | Attending: Cardiovascular Disease | Admitting: Cardiovascular Disease

## 2015-06-16 ENCOUNTER — Other Ambulatory Visit (INDEPENDENT_AMBULATORY_CARE_PROVIDER_SITE_OTHER): Payer: Medicare Other | Admitting: *Deleted

## 2015-06-16 DIAGNOSIS — Z139 Encounter for screening, unspecified: Secondary | ICD-10-CM | POA: Diagnosis not present

## 2015-06-16 DIAGNOSIS — Z79899 Other long term (current) drug therapy: Secondary | ICD-10-CM | POA: Diagnosis not present

## 2015-06-16 DIAGNOSIS — Z0000 Encounter for general adult medical examination without abnormal findings: Secondary | ICD-10-CM | POA: Diagnosis not present

## 2015-06-16 DIAGNOSIS — E785 Hyperlipidemia, unspecified: Secondary | ICD-10-CM

## 2015-06-16 LAB — BASIC METABOLIC PANEL
BUN: 17 mg/dL (ref 7–25)
CHLORIDE: 103 mmol/L (ref 98–110)
CO2: 28 mmol/L (ref 20–31)
Calcium: 8.9 mg/dL (ref 8.6–10.3)
Creat: 0.9 mg/dL (ref 0.70–1.18)
Glucose, Bld: 106 mg/dL — ABNORMAL HIGH (ref 65–99)
POTASSIUM: 4.5 mmol/L (ref 3.5–5.3)
Sodium: 138 mmol/L (ref 135–146)

## 2015-06-16 LAB — HEMOGLOBIN A1C
Hgb A1c MFr Bld: 5.6 % (ref <5.7)
MEAN PLASMA GLUCOSE: 114 mg/dL

## 2015-06-16 LAB — LIPID PANEL
Cholesterol: 101 mg/dL — ABNORMAL LOW (ref 125–200)
HDL: 46 mg/dL (ref >=40)
LDL CALC: 42 mg/dL (ref <130)
TRIGLYCERIDES: 65 mg/dL (ref <150)
Total CHOL/HDL Ratio: 2.2 Ratio (ref <=5.0)
VLDL: 13 mg/dL (ref <30)

## 2015-06-16 LAB — HEPATIC FUNCTION PANEL
ALBUMIN: 4.2 g/dL (ref 3.6–5.1)
ALT: 20 U/L (ref 9–46)
AST: 16 U/L (ref 10–35)
Alkaline Phosphatase: 45 U/L (ref 40–115)
BILIRUBIN TOTAL: 1.8 mg/dL — AB (ref 0.2–1.2)
Bilirubin, Direct: 0.4 mg/dL — ABNORMAL HIGH (ref <=0.2)
Indirect Bilirubin: 1.4 mg/dL — ABNORMAL HIGH (ref 0.2–1.2)
TOTAL PROTEIN: 6.5 g/dL (ref 6.1–8.1)

## 2015-06-16 NOTE — Addendum Note (Signed)
Addended by: Tonita PhoenixBOWDEN, ROBIN K on: 06/16/2015 08:57 AM   Modules accepted: Orders

## 2015-06-17 LAB — PSA, MEDICARE: PSA: 1.82 ng/mL (ref <=4.00)

## 2015-06-19 ENCOUNTER — Ambulatory Visit (INDEPENDENT_AMBULATORY_CARE_PROVIDER_SITE_OTHER): Payer: Medicare Other | Admitting: Cardiovascular Disease

## 2015-06-19 ENCOUNTER — Encounter: Payer: Self-pay | Admitting: Cardiovascular Disease

## 2015-06-19 ENCOUNTER — Encounter: Payer: Self-pay | Admitting: Nurse Practitioner

## 2015-06-19 VITALS — BP 140/70 | HR 52 | Ht 67.0 in | Wt 191.2 lb

## 2015-06-19 DIAGNOSIS — I2510 Atherosclerotic heart disease of native coronary artery without angina pectoris: Secondary | ICD-10-CM

## 2015-06-19 DIAGNOSIS — E785 Hyperlipidemia, unspecified: Secondary | ICD-10-CM | POA: Diagnosis not present

## 2015-06-19 NOTE — Progress Notes (Signed)
Cardiology Office Note   Date:  06/19/2015   ID:  JAKYRI BRUNKHORST, DOB 07-Mar-1946, MRN [id number]  PCP:  Kaleen Mask, MD  Cardiologist:   Vesta Mixer, MD   No chief complaint on file.  1. CAD ( CABG Oct. 6, 2003)  2. Dyslipidemia 3. Atrial fibrillation - post op from CABG.  4. Depression  History of Present Illness:  William Hernandez is a 70 year old gentleman with a history of coronary disease-status post coronary artery bypass grafting. He's done very well from a cardiac standpoint. He is participating in cardiac rehabilitation and has not had any episodes of chest pain or shortness breath.  He has done well. His lipids are well controlled.   He is participating in cardiac rehab and is doing well. 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 He doesn't have any episodes of chest pain or shortness breath when he is active. 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  He continues to have issues with anxiety.   July 14, 2012:  William Hernandez is doing well. He had a nose bleed several weeks ago. He is exercising regularly. No angina. No dyspnea.  Oct. 27, 2014:  He is doing well. 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 2 very hard games of 3 on 3 , half court game. No pain. Active. Exercises. Increased his Trazadone to help with some additional depression.   July 14 2013:  William Hernandez is doing well. He remains very active. Plays in the Senior games. No CP. Has has a URI for the past 10 days which has slowed him down a bit.   Oct. 26, 2015:  William Hernandez is doing well.  Bp is well controlled. Doing lots of fishing. Plays basketball on Wednesdays. No Cp .      Jul 21, 2014:  William Hernandez is a 70 y.o. male who presents for follow up of his CAD and hyperlipidemia Still exercising well.   Still playing basketball regularly.  No angina    01/17/2015: Doing well from a  cardiac standpoint. Has had a head cold - difficulty hearing in his left ear   June 19, 2015:  William Hernandez is seen for follow up visit .  Still playing basketball regularly . No  CP or dyspnea.     Past Medical History  Diagnosis Date  . Hyperlipidemia   . Arrhythmia     afib  . Depression   . Coronary artery disease     2003  . S/P hernia surgery     Past Surgical History  Procedure Laterality Date  . Hernia repair    . Cholecystectomy    . Cardiac catheterization      11/2001  . Coronary artery bypass graft  2000     Current Outpatient Prescriptions  Medication Sig Dispense Refill  . aspirin 81 MG tablet Take 81 mg by mouth daily.    Marland Kitchen atorvastatin (LIPITOR) 20 MG tablet TAKE 1 TABLET BY MOUTH ONCE DAILY 90 tablet 3  . BYSTOLIC 5 MG tablet TAKE 1 TABLET BY MOUTH DAILY. 30 tablet 11  . fish oil-omega-3 fatty acids 1000 MG capsule Take 1 g by mouth daily.     . Flaxseed, Linseed, (FLAX SEED OIL PO) Take 1 tablet by mouth daily.     Marland Kitchen FLUZONE HIGH-DOSE 0.5 ML SUSY Inject as directed once.  0  . irbesartan (AVAPRO) 150 MG tablet TAKE 1/2 TABLET  BY MOUTH AT BEDTIME. 45 tablet 2  . Multiple Vitamin (MULTIVITAMIN PO) Take by mouth daily.      . ranitidine (ZANTAC) 150 MG capsule Take 150 mg by mouth every evening.      . sildenafil (REVATIO) 20 MG tablet Use 1 to 5 pills at a time as needed (Patient taking differently: Take 20 mg by mouth 3 (three) times daily. Use 1 to 5 pills at a time as needed) 30 tablet 5  . TRAZODONE HCL PO Take 200 mg by mouth at bedtime as needed (AS NEEDED FOR SLEEP).      No current facility-administered medications for this visit.    Allergies:   Lisinopril and Penicillins    Social History:  The patient  reports that he has never smoked. He does not have any smokeless tobacco history on file. He reports that he does not drink alcohol or use illicit drugs.   Family History:  The patient's family history includes Heart disease in his father.     ROS:  Please see the history of present illness.    Review of Systems: Constitutional:  denies fever, chills, diaphoresis, appetite change and fatigue.  HEENT: denies photophobia, eye pain, redness, hearing loss, ear pain, congestion, sore throat, rhinorrhea, sneezing, neck pain, neck stiffness and tinnitus.  Respiratory: denies SOB, DOE, cough, chest tightness, and wheezing.  Cardiovascular: denies chest pain, palpitations and leg swelling.  Gastrointestinal: denies nausea, vomiting, abdominal pain, diarrhea, constipation, blood in stool.  Genitourinary: denies dysuria, urgency, frequency, hematuria, flank pain and difficulty urinating.  Musculoskeletal: denies  myalgias, back pain, joint swelling, arthralgias and gait problem.   Skin: denies pallor, rash and wound.  Neurological: denies dizziness, seizures, syncope, weakness, light-headedness, numbness and headaches.   Hematological: denies adenopathy, easy bruising, personal or family bleeding history.  Psychiatric/ Behavioral: denies suicidal ideation, mood changes, confusion, nervousness, sleep disturbance and agitation.       All other systems are reviewed and negative.    PHYSICAL EXAM: VS:  BP 140/70 mmHg  Pulse 52  Ht  (1.702 m)  Wt 191 lb 3.2 oz (86.728 kg)  BMI 29.94 kg/m2 , BMI Body mass index is 29.94 kg/(m^2). GEN: Well nourished, well developed, in no acute distress HEENT: normal Neck: no JVD, carotid bruits, or masses Cardiac: RRR; no murmurs, rubs, or gallops,no edema  Respiratory:  clear to auscultation bilaterally, normal work of breathing GI: soft, nontender, nondistended, + BS MS: no deformity or atrophy Skin: warm and dry, no rash Neuro:  Strength and sensation are intact Psych: normal   EKG:  EKG is ordered today. The ekg ordered today demonstrates sinus brady at 52.  Inc. RBBB.    Recent Labs: 06/16/2015: ALT 20; BUN 17; Creat 0.90; Potassium 4.5; Sodium 138    Lipid Panel     Component Value Date/Time   CHOL 101* 06/16/2015 0857   TRIG 65 06/16/2015 0857   HDL 46 06/16/2015 0857   CHOLHDL 2.2 06/16/2015 0857   VLDL 13 06/16/2015 0857   LDLCALC 42 06/16/2015 0857      Wt Readings from Last 3 Encounters:  06/19/15 191 lb 3.2 oz (86.728 kg)  01/17/15 191 lb (86.637 kg)  07/21/14 191 lb 3.2 oz (86.728 kg)      Other studies Reviewed: Additional studies/ records that were reviewed today include: . Review of the above records demonstrates:    ASSESSMENT AND PLAN:  1. CAD ( CABG Oct. 6, 2003)  - doing well.  No angina.  He is very active.    2. Dyslipidemia- doing well. Continue same meds,  check labs today.  3. Atrial fibrillation - occurred following CABG.  No recurrent atrial fibrillation at this point.  4. Depression - he sees Becky Kincaid,MS  5. HTN:  BP is well controlled.   Current medicines are reviewed at length with the patient today.  The patient does not have concerns regarding medicines.  The following changes have been made:  no change  Labs/ tests ordered today include:  No orders of the defined types were placed in this encounter.     Disposition:   FU with me in 6 months       Nahser, Deloris PingPhilip J, MD  06/19/2015 9:24 AM    [facility] [address], [place], [place]  [address] Phone: [phone]; Fax: [phone]   [facility] [place] [facility]  [address] [place], [place]  [phone]    Fax [phone]

## 2015-06-19 NOTE — Patient Instructions (Signed)

## 2015-06-20 ENCOUNTER — Encounter (HOSPITAL_COMMUNITY): Payer: Medicare Other

## 2015-06-20 DIAGNOSIS — Z951 Presence of aortocoronary bypass graft: Secondary | ICD-10-CM | POA: Insufficient documentation

## 2015-06-21 ENCOUNTER — Encounter (HOSPITAL_COMMUNITY): Payer: Medicare Other

## 2015-06-22 ENCOUNTER — Encounter (HOSPITAL_COMMUNITY): Payer: Medicare Other

## 2015-06-27 ENCOUNTER — Ambulatory Visit (HOSPITAL_COMMUNITY): Payer: Medicare Other | Attending: Family Medicine

## 2015-06-27 ENCOUNTER — Telehealth: Payer: Self-pay | Admitting: Cardiovascular Disease

## 2015-06-27 ENCOUNTER — Encounter (HOSPITAL_COMMUNITY)
Admission: RE | Admit: 2015-06-27 | Discharge: 2015-06-27 | Disposition: A | Discharge status: Home / Self Care | Payer: Medicare Other | Source: Ambulatory Visit | Attending: Cardiovascular Disease | Admitting: Cardiovascular Disease

## 2015-06-27 DIAGNOSIS — Z951 Presence of aortocoronary bypass graft: Secondary | ICD-10-CM | POA: Diagnosis present

## 2015-06-27 NOTE — Telephone Encounter (Signed)
Documentation per Cardiac Rehab today:  Progress Notes      Lelon HuhAnnedrea L Stackhouse, RN at 06/27/2015 8:51 AM     Status: Signed       Expand All Collapse All   Patient participating in 6:45am Cardiac Maintenance program. Reported chest pressure pointing to upper epigastric area. Denied any radiation of pain/discomfort. No nausea, warm dry to touch. Onset during walking the track. Rated 7 to 8 on a 0-10 pain scale. Had patient sit. BP 150/84. Placed on Zoll monitor. ECG 58-60 Sinus Bradycardia while sitting. Pressure slightly eased with sitting. Called for 12-Lead EKG. Reported discomfort completely gone just as placing on oxygen 2 liters Nasal cannula and prior to EKG arrival. Proceeded with 12-lead. Patient reported only change in Medication routine had been taking Naprosyn for a few days for right knee pain. He did not take Naprosyn yesterday. Also reports lifting several heavy bags yesterday. Recheck BP 156/84 while sitting. With discomfort gone patient proceeded to walk the track with no return of pressure/discomfort. Received return phone call from Ward Givenshris Berge NP, provided report of patient complaints and status. He reviewed EKG and provided instructions for patient to follow up with Dr. Elease HashimotoNahser and clear to excuse from Cardiac rehab this morning. Patient completed cool-down. Exit BP 128/70. HR 56. No return of Chest discomfort.       I will forward this information to Dr Elease HashimotoNahser to review and make recommendations if needed. Dr Elease HashimotoNahser saw the pt last week on 06/19/15.  I spoke with the pt and he has taken Naprosyn the past 3 days due to his legs aching from playing basketball.  The pt did yard work yesterday and lifted 7 40 lb bags of top soil without any symptoms.  Today the pt took his trash cans to the road without any symptoms.  I reviewed the information documented per cardiac rehab and the pt said that he did "belch" while having symptoms and his symptoms subsided.  At this time the pt will  continue with observation of symptoms.

## 2015-06-27 NOTE — Progress Notes (Signed)
Patient participating in 6:45am Cardiac Maintenance program. Reported chest pressure pointing to upper epigastric area. Denied any radiation of pain/discomfort. No nausea, warm dry to touch. Onset during walking the track. Rated 7 to 8 on a 0-10 pain scale. Had patient sit. BP 150/84. Placed on Zoll monitor. ECG 58-60 Sinus Bradycardia while sitting. Pressure slightly eased with sitting. Called for 12-Lead EKG.  Reported discomfort completely gone just as placing on oxygen 2 liters Nasal cannula and prior to EKG arrival. Proceeded with 12-lead.  Patient reported only change in Medication routine had been taking Naprosyn for a few days for right knee pain. He did not take Naprosyn yesterday. Also reports lifting several heavy bags yesterday. Recheck BP 156/84 while sitting. With discomfort gone patient proceeded to walk the track with no return of pressure/discomfort.  Received return phone call from Ward Givenshris Berge NP, provided report of patient complaints and status. He reviewed EKG and provided instructions for patient to follow up with Dr. Elease HashimotoNahser and clear to excuse from Cardiac rehab this morning. Patient completed cool-down. Exit BP 128/70. HR 56. No return of Chest discomfort.

## 2015-06-27 NOTE — Telephone Encounter (Signed)
Pt has episode at cardiac rehab-chest pressure-BP high-EKG normal- pls call [phone]

## 2015-06-28 ENCOUNTER — Encounter (HOSPITAL_COMMUNITY): Admission: RE | Admit: 2015-06-28 | Payer: Medicare Other | Source: Ambulatory Visit

## 2015-06-28 NOTE — Telephone Encounter (Signed)
I agree with note by Julieta GuttingLauren Brown, RN. I suspect this was GI in origin. He is very active and has no angina  He should call us for any further angina like pain

## 2015-06-28 NOTE — Telephone Encounter (Signed)
I left the pt a message on his identified voicemail with Dr Harvie BridgeNahser's comments.  Advised that he contact our office with any recurrence of symptoms or further questions and concerns.

## 2015-06-29 ENCOUNTER — Encounter (HOSPITAL_COMMUNITY): Payer: Medicare Other

## 2015-07-03 ENCOUNTER — Other Ambulatory Visit: Payer: Self-pay | Admitting: Cardiovascular Disease

## 2015-07-04 ENCOUNTER — Encounter (HOSPITAL_COMMUNITY)
Admission: RE | Admit: 2015-07-04 | Discharge: 2015-07-04 | Disposition: A | Discharge status: Home / Self Care | Payer: Medicare Other | Source: Ambulatory Visit | Attending: Cardiovascular Disease | Admitting: Cardiovascular Disease

## 2015-07-05 ENCOUNTER — Encounter (HOSPITAL_COMMUNITY): Payer: Medicare Other

## 2015-07-06 ENCOUNTER — Encounter (HOSPITAL_COMMUNITY): Payer: Medicare Other

## 2015-07-11 ENCOUNTER — Encounter (HOSPITAL_COMMUNITY)
Admission: RE | Admit: 2015-07-11 | Discharge: 2015-07-11 | Disposition: A | Discharge status: Home / Self Care | Payer: Medicare Other | Source: Ambulatory Visit | Attending: Cardiovascular Disease | Admitting: Cardiovascular Disease

## 2015-07-12 ENCOUNTER — Encounter (HOSPITAL_COMMUNITY): Payer: Medicare Other

## 2015-07-13 ENCOUNTER — Encounter (HOSPITAL_COMMUNITY): Payer: Medicare Other

## 2015-07-18 ENCOUNTER — Encounter (HOSPITAL_COMMUNITY)
Admission: RE | Admit: 2015-07-18 | Discharge: 2015-07-18 | Disposition: A | Discharge status: Home / Self Care | Payer: Self-pay | Source: Ambulatory Visit | Attending: Cardiovascular Disease | Admitting: Cardiovascular Disease

## 2015-07-18 DIAGNOSIS — Z951 Presence of aortocoronary bypass graft: Secondary | ICD-10-CM | POA: Insufficient documentation

## 2015-07-19 ENCOUNTER — Encounter (HOSPITAL_COMMUNITY): Payer: Self-pay

## 2015-07-20 ENCOUNTER — Encounter (HOSPITAL_COMMUNITY)
Admission: RE | Admit: 2015-07-20 | Discharge: 2015-07-20 | Disposition: A | Discharge status: Home / Self Care | Payer: Self-pay | Source: Ambulatory Visit | Attending: Cardiovascular Disease | Admitting: Cardiovascular Disease

## 2015-07-24 ENCOUNTER — Telehealth: Payer: Self-pay | Admitting: Cardiovascular Disease

## 2015-07-24 NOTE — Telephone Encounter (Signed)
New message   Pt states he is NOT having any pain or medical issues today  He just wants Dr.Nasdher rn to know sometimes he thinks he is having gas pain from time ot time in his chest  Every morining he gets up with stomach issues when he is worrying about something  Pt is very active he plays basket ball almost every week,  He said he has spoken to PCP and he has to go to Cardiac rehab 07-25-15 and if he is having any issues then he will tell one of the nurses

## 2015-07-24 NOTE — Telephone Encounter (Signed)
I spoke with the pt and he continues to complain of issues with pain in his chest (see 06/27/15 telephone encounter).  The pt said he gets this feeling off and on in his chest which he thinks is a "gas" pain.  The pt has heart burn when lying down and does notice a lot of gas at night.  The pt has also had trouble swallowing for a number of years when he eats certain foods.  The pt said he may have seen a GI physician a number of years ago and had a barium swallow performed.  The pt has spoken with his PCP and they advised the pt to see what Dr Elease HashimotoNahser recommends.  The pt said he does not want to have unnecessary tests but would like to find out what is causing his symptoms.  The pt remains very active playing basketball.  The pt said he also worries about things and this makes his stomach worse.  I will forward this information to Dr Elease HashimotoNahser for further review.

## 2015-07-24 NOTE — Telephone Encounter (Signed)
He is extremely active - plays basketball several times a week, Actively fly fishes, participates in the senior games of [place] - I doubt this is angina . I would advise him to see his GI doctor . If that work up is negative, we can consider a myoview.

## 2015-07-25 ENCOUNTER — Encounter (HOSPITAL_COMMUNITY)
Admission: RE | Admit: 2015-07-25 | Discharge: 2015-07-25 | Disposition: A | Discharge status: Home / Self Care | Payer: Self-pay | Source: Ambulatory Visit | Attending: Cardiovascular Disease | Admitting: Cardiovascular Disease

## 2015-07-25 NOTE — Telephone Encounter (Signed)
Left message to call back  

## 2015-07-25 NOTE — Telephone Encounter (Signed)
Spoke with pt and gave him information from Dr. Elease HashimotoNahser. He will contact his primary care doctor for GI referral. I asked him to update us if GI work up did not show any problems.

## 2015-07-26 ENCOUNTER — Encounter (HOSPITAL_COMMUNITY): Payer: Self-pay

## 2015-07-27 ENCOUNTER — Encounter (HOSPITAL_COMMUNITY): Payer: Self-pay

## 2015-08-01 ENCOUNTER — Telehealth (HOSPITAL_COMMUNITY): Payer: Self-pay | Admitting: *Deleted

## 2015-08-01 ENCOUNTER — Encounter (HOSPITAL_COMMUNITY)
Admission: RE | Admit: 2015-08-01 | Discharge: 2015-08-01 | Disposition: A | Discharge status: Home / Self Care | Payer: Self-pay | Source: Ambulatory Visit | Attending: Cardiovascular Disease | Admitting: Cardiovascular Disease

## 2015-08-01 DIAGNOSIS — I25119 Atherosclerotic heart disease of native coronary artery with unspecified angina pectoris: Secondary | ICD-10-CM

## 2015-08-01 NOTE — Telephone Encounter (Signed)
Spoke with Sue [facility]ndrea from [facility] who reports patient had an episode of chest tightness during cardiac rehab this morning. She states the patient reported symptoms resolved after administration of oxygen.  The patient walked around the unit after resting for a while and felt well enough to leave for another appointment.  I reviewed with Dr. Elease HashimotoNahser, who is in the office, and he advised that patient come in for exercise myocardial imaging study and then an appointment tomorrow.   I called and spoke with patient who states she is currently pain free. Doyne Keelonya Yount, nuclear medicine technician was able to schedule patient for test tomorrow morning and the patient will see Dr. Elease HashimotoNahser at 1:30.  Patient is aware to hold Bystolic, caffeine, decaffeinated and chocolate products for 12 hours.  He is aware to only have water for 2 hours prior to the test.  I advised him to take NTG today if symptoms return and that he could also call our office for advice if he has an concerns.  He verbalized understanding and agreement with plan and thanked me for the call.

## 2015-08-01 NOTE — Progress Notes (Signed)
6:45 Cardiac rehab maintenance exercise class: Patient c/o of chest tightness as he was doing his warm-up laps this morning. He shared he has a future appointment for GI consultation. However the pressure continued and patient said he was feeling somewhat anxious and just not sure if GI. He shared he has experienced on and off again episodes since the April 11th episode here at cardiac rehab, with recent symptoms when he [facility]. He shared with [facility] he had felt tightness for about the first five minutes of warming up then went away and was able to play without any further symptoms. He also shared that he has more noticeable shortness of breath when walking inclines. This past Saturday he said he had noticed being more winded while at an event at [facility] [employer], especially while walking an incline. BP 150/78 HR 61. Placed on Zoll ECG noted NSR low 60s.  He rated 7 to 8 on 0-10 pain scale. After a few minutes of sitting the discomfort was gone. However was still feeling anxious and had sudden urge for bowel movement. Once he returned from the restroom he said he felt a little better. He sat about 5 more minutes however the chest discomfort returned and rated a 6 on pain scale and said he was feeling some discomfort in shoulders as well.  Added oxygen [place] at 4L. Placed page times 2 to the Card-Master at about 7:20am - 7:25am. After about 5 minutes he reported discomfort completely gone. Removed oxygen. He was able to walk several laps and participated in the relaxation phase without any return of discomfort. Patient will follow-up with Dr. Harvie BridgeNahser's office today and informed him this writer will follow-up with the office as well. Exit blood pressure 144/78 HR-59.

## 2015-08-01 NOTE — Telephone Encounter (Signed)
Patient given detailed instructions per Myocardial Perfusion Study Information Sheet for the test on 08/02/15 at 0800. Patient notified to arrive 15 minutes early and that it is imperative to arrive on time for appointment to keep from having the test rescheduled.  If you need to cancel or reschedule your appointment, please call the office within 24 hours of your appointment. Failure to do so may result in a cancellation of your appointment, and a $50 no show fee. Patient verbalized understanding.Titus Dubinonya Yount,CNMT

## 2015-08-02 ENCOUNTER — Encounter (HOSPITAL_COMMUNITY): Payer: Self-pay

## 2015-08-02 ENCOUNTER — Encounter (HOSPITAL_COMMUNITY): Admission: RE | Admit: 2015-08-02 | Payer: Self-pay | Source: Ambulatory Visit

## 2015-08-02 ENCOUNTER — Ambulatory Visit (INDEPENDENT_AMBULATORY_CARE_PROVIDER_SITE_OTHER): Payer: Medicare Other | Admitting: Cardiovascular Disease

## 2015-08-02 ENCOUNTER — Encounter: Payer: Self-pay | Admitting: Cardiovascular Disease

## 2015-08-02 ENCOUNTER — Ambulatory Visit (HOSPITAL_COMMUNITY): Payer: Medicare Other | Attending: Cardiology

## 2015-08-02 ENCOUNTER — Encounter: Payer: Self-pay | Admitting: Nurse Practitioner

## 2015-08-02 DIAGNOSIS — I2510 Atherosclerotic heart disease of native coronary artery without angina pectoris: Secondary | ICD-10-CM

## 2015-08-02 DIAGNOSIS — I25119 Atherosclerotic heart disease of native coronary artery with unspecified angina pectoris: Secondary | ICD-10-CM | POA: Insufficient documentation

## 2015-08-02 DIAGNOSIS — E785 Hyperlipidemia, unspecified: Secondary | ICD-10-CM

## 2015-08-02 DIAGNOSIS — I10 Essential (primary) hypertension: Secondary | ICD-10-CM | POA: Insufficient documentation

## 2015-08-02 DIAGNOSIS — I25110 Atherosclerotic heart disease of native coronary artery with unstable angina pectoris: Secondary | ICD-10-CM | POA: Diagnosis not present

## 2015-08-02 DIAGNOSIS — R079 Chest pain, unspecified: Secondary | ICD-10-CM | POA: Diagnosis not present

## 2015-08-02 LAB — MYOCARDIAL PERFUSION IMAGING
CHL CUP NUCLEAR SRS: 3
CHL CUP NUCLEAR SSS: 10
CSEPED: 9 min
CSEPHR: 76 %
CSEPPHR: 114 {beats}/min
Estimated workload: 9.7 METS
Exercise duration (sec): 29 s
LHR: 0.27
LV dias vol: 135 mL (ref 62–150)
LV sys vol: 74 mL
MPHR: 149 {beats}/min
Rest HR: 53 {beats}/min
SDS: 7
TID: 0.94

## 2015-08-02 LAB — CBC WITH DIFFERENTIAL/PLATELET
BASOS ABS: 0 {cells}/uL (ref 0–200)
Basophils Relative: 0 %
EOS ABS: 162 {cells}/uL (ref 15–500)
EOS PCT: 2 %
HCT: 46.3 % (ref 38.5–50.0)
Hemoglobin: 16.3 g/dL (ref 13.2–17.1)
LYMPHS PCT: 26 %
Lymphs Abs: 2106 cells/uL (ref 850–3900)
MCH: 33.4 pg — AB (ref 27.0–33.0)
MCHC: 35.2 g/dL (ref 32.0–36.0)
MCV: 94.9 fL (ref 80.0–100.0)
MONOS PCT: 11 %
MPV: 10.4 fL (ref 7.5–12.5)
Monocytes Absolute: 891 cells/uL (ref 200–950)
NEUTROS ABS: 4941 {cells}/uL (ref 1500–7800)
Neutrophils Relative %: 61 %
PLATELETS: 207 10*3/uL (ref 140–400)
RBC: 4.88 MIL/uL (ref 4.20–5.80)
RDW: 13.5 % (ref 11.0–15.0)
WBC: 8.1 10*3/uL (ref 3.8–10.8)

## 2015-08-02 LAB — BASIC METABOLIC PANEL
BUN: 16 mg/dL (ref 7–25)
CALCIUM: 9.4 mg/dL (ref 8.6–10.3)
CO2: 24 mmol/L (ref 20–31)
CREATININE: 1.11 mg/dL (ref 0.70–1.18)
Chloride: 99 mmol/L (ref 98–110)
Glucose, Bld: 140 mg/dL — ABNORMAL HIGH (ref 65–99)
Potassium: 4.1 mmol/L (ref 3.5–5.3)
SODIUM: 137 mmol/L (ref 135–146)

## 2015-08-02 IMAGING — NM NM MISC PROCEDURE
9 series · 54 of 54 positions shown · non-contrast
Comparison: none

[Series 1: stress-gsp · 6.40mm/px · 6 of 506 frames shown]
[frame 43/506]
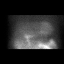
[frame 127/506]
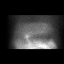
[frame 211/506]
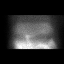
[frame 296/506]
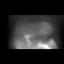
[frame 380/506]
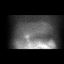
[frame 464/506]
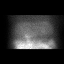

[Series 1: wbr_s-proj_st stress-gsp · 6.40mm/px · 6 of 512 frames shown]
[frame 43/512]
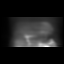
[frame 128/512]
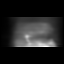
[frame 214/512]
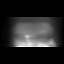
[frame 299/512]
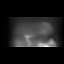
[frame 384/512]
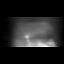
[frame 470/512]
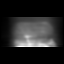

[Series 1: stress-sum-em · 6.40mm/px · 6 of 64 frames shown]
[frame 6/64]
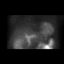
[frame 16/64]
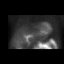
[frame 27/64]
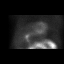
[frame 38/64]
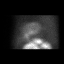
[frame 48/64]
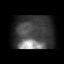
[frame 59/64]
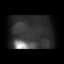

[Series 1: stress-gsp_(id)_sa · 6.4mm · 6.40mm/px · 6 of 512 frames shown]
[frame 43/512]
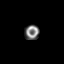
[frame 128/512]
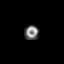
[frame 214/512]
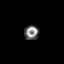
[frame 299/512]
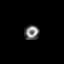
[frame 384/512]
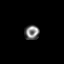
[frame 470/512]
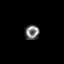

[Series 1: wbr_s-proj_st stress-sum-em · 6.40mm/px · 6 of 64 frames shown]
[frame 6/64]
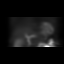
[frame 16/64]
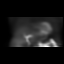
[frame 27/64]
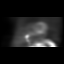
[frame 38/64]
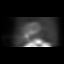
[frame 48/64]
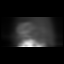
[frame 59/64]
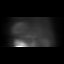

[Series 1: stress-sum-em_(id)_sa · 6.4mm · 6.40mm/px · 6 of 64 frames shown]
[frame 6/64]
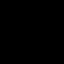
[frame 16/64]
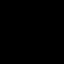
[frame 27/64]
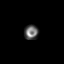
[frame 38/64]
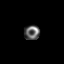
[frame 48/64]
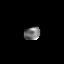
[frame 59/64]
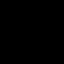

[Series 1: rest · 6.40mm/px · 6 of 64 frames shown]
[frame 6/64]
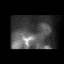
[frame 16/64]
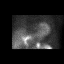
[frame 27/64]
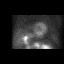
[frame 38/64]
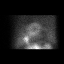
[frame 48/64]
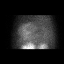
[frame 59/64]
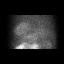

[Series 1: wbr_r-proj_st rest · 6.40mm/px · 6 of 64 frames shown]
[frame 6/64]
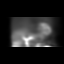
[frame 16/64]
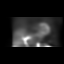
[frame 27/64]
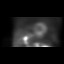
[frame 38/64]
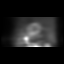
[frame 48/64]
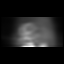
[frame 59/64]
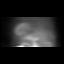

[Series 1: rest_(id)_sa · 6.4mm · 6.40mm/px · 6 of 64 frames shown]
[frame 6/64]
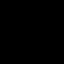
[frame 16/64]
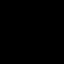
[frame 27/64]
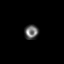
[frame 38/64]
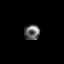
[frame 48/64]
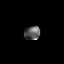
[frame 59/64]
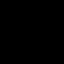

[54 of 54 positions shown; findings below may reference images not displayed]

Canned report from images found in remote index.

Refer to host system for actual result text.

## 2015-08-02 MED ORDER — TECHNETIUM TC 99M TETROFOSMIN IV KIT
10.1000 | PACK | Freq: Once | INTRAVENOUS | Status: AC | PRN
  Administered 2015-08-02: 10.1 via INTRAVENOUS
  Filled 2015-08-02: qty 10

## 2015-08-02 MED ORDER — REGADENOSON 0.4 MG/5ML IV SOLN
0.4000 mg | Freq: Once | INTRAVENOUS | Status: AC
  Administered 2015-08-02: 0.4 mg via INTRAVENOUS

## 2015-08-02 MED ORDER — TECHNETIUM TC 99M TETROFOSMIN IV KIT
32.8000 | PACK | Freq: Once | INTRAVENOUS | Status: AC | PRN
  Administered 2015-08-02: 32.8 via INTRAVENOUS
  Filled 2015-08-02: qty 33

## 2015-08-02 NOTE — Progress Notes (Signed)
Cardiology Office Note   Date:  08/02/2015   ID:  William Hernandez, DOB [birth year], MRN [id number]  PCP:  Kaleen Mask, MD  Cardiologist:   Kristeen Miss, MD   Chief Complaint  Patient presents with  . Coronary Artery Disease   1. CAD ( CABG Oct. 6, 2003)  2. Dyslipidemia 3. Atrial fibrillation - post op from CABG.  4. Depression  History of Present Illness:  William Hernandez is a 70 year old gentleman with a history of coronary disease-status post coronary artery bypass grafting. He's done very well from a cardiac standpoint. He is participating in cardiac rehabilitation and has not had any episodes of chest pain or shortness breath.  He has done well. His lipids are well controlled.   He is participating in cardiac rehab and is doing well. 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 He doesn't have any episodes of chest pain or shortness breath when he is active. 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  He continues to have issues with anxiety.   July 14, 2012:  William Hernandez is doing well. He had a nose bleed several weeks ago. He is exercising regularly. No angina. No dyspnea.  Oct. 27, 2014:  He is doing well. 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 2 very hard games of 3 on 3 , half court game. No pain. Active. Exercises. Increased his Trazadone to help with some additional depression.   July 14 2013:  William Hernandez is doing well. He remains very active. Plays in the Senior games. No CP. Has has a URI for the past 10 days which has slowed him down a bit.   Oct. 26, 2015:  William Hernandez is doing well.  Bp is well controlled. Doing lots of fishing. Plays basketball on Wednesdays. No Cp .      Jul 21, 2014:  William Hernandez is a 70 y.o. male who presents for follow up of his CAD and hyperlipidemia Still exercising well.   Still playing basketball regularly.  No angina     01/17/2015: Doing well from a cardiac standpoint. Has had a head cold - difficulty hearing in his left ear   June 19, 2015:  William Hernandez is seen for follow up visit .  Still playing basketball regularly . No  CP or dyspnea.     Aug 02, 2015:  William Hernandez is seen back for an episode CP that occurred in Cardiac rehab yesterday   Feels it at cardiac rehab Also when he first starts out playing basket ball After he gets warmed up, he feels fine.  Also occurs when he is mowing the grass.    myoview today shows no large area of ishcmeia. LV function is mildly depressed with EF 45%     Past Medical History  Diagnosis Date  . Hyperlipidemia   . Arrhythmia     afib  . Depression   . Coronary artery disease     2003  . S/P hernia surgery     Past Surgical History  Procedure Laterality Date  . Hernia repair    . Cholecystectomy    . Cardiac catheterization      11/2001  . Coronary artery bypass graft  2000     Current Outpatient Prescriptions  Medication Sig Dispense Refill  . aspirin 81 MG tablet Take 81 mg by mouth daily.    Marland Kitchen atorvastatin (LIPITOR) 20 MG tablet TAKE 1 TABLET  BY MOUTH ONCE DAILY 90 tablet 3  . BYSTOLIC 5 MG tablet TAKE 1 TABLET BY MOUTH DAILY. 30 tablet 11  . fish oil-omega-3 fatty acids 1000 MG capsule Take 1 g by mouth daily.     . Flaxseed, Linseed, (FLAX SEED OIL PO) Take 1 tablet by mouth daily.     Marland Kitchen FLUZONE HIGH-DOSE 0.5 ML SUSY Inject as directed once.  0  . irbesartan (AVAPRO) 150 MG tablet TAKE 1/2 TABLET BY MOUTH AT BEDTIME. 45 tablet 1  . Multiple Vitamin (MULTIVITAMIN PO) Take by mouth daily.      . ranitidine (ZANTAC) 150 MG capsule Take 150 mg by mouth every evening.      . sildenafil (REVATIO) 20 MG tablet Use 1 to 5 pills at a time as needed (Patient taking differently: Take 20 mg by mouth 3 (three) times daily. Use 1 to 5 pills at a time as needed) 30 tablet 5  . TRAZODONE HCL PO Take 200 mg by mouth at bedtime as needed (AS NEEDED FOR SLEEP).       No current facility-administered medications for this visit.    Allergies:   Lisinopril and Penicillins    Social History:  The patient  reports that he has never smoked. He does not have any smokeless tobacco history on file. He reports that he does not drink alcohol or use illicit drugs.   Family History:  The patient's family history includes Heart disease in his father.    ROS:  Please see the history of present illness.    Review of Systems: Constitutional:  denies fever, chills, diaphoresis, appetite change and fatigue.  HEENT: denies photophobia, eye pain, redness, hearing loss, ear pain, congestion, sore throat, rhinorrhea, sneezing, neck pain, neck stiffness and tinnitus.  Respiratory: denies SOB, DOE, cough, chest tightness, and wheezing.  Cardiovascular: denies chest pain, palpitations and leg swelling.  Gastrointestinal: denies nausea, vomiting, abdominal pain, diarrhea, constipation, blood in stool.  Genitourinary: denies dysuria, urgency, frequency, hematuria, flank pain and difficulty urinating.  Musculoskeletal: denies  myalgias, back pain, joint swelling, arthralgias and gait problem.   Skin: denies pallor, rash and wound.  Neurological: denies dizziness, seizures, syncope, weakness, light-headedness, numbness and headaches.   Hematological: denies adenopathy, easy bruising, personal or family bleeding history.  Psychiatric/ Behavioral: denies suicidal ideation, mood changes, confusion, nervousness, sleep disturbance and agitation.       All other systems are reviewed and negative.    PHYSICAL EXAM: VS:  There were no vitals taken for this visit. , BMI There is no weight on file to calculate BMI. GEN: Well nourished, well developed, in no acute distress HEENT: normal Neck: no JVD, carotid bruits, or masses Cardiac: RRR; no murmurs, rubs, or gallops,no edema  Respiratory:  clear to auscultation bilaterally, normal work of breathing GI: soft, nontender,  nondistended, + BS MS: no deformity or atrophy Skin: warm and dry, no rash Neuro:  Strength and sensation are intact Psych: normal   EKG:  EKG is ordered today. The ekg ordered today demonstrates sinus brady at 52.  Inc. RBBB.    Recent Labs: 06/16/2015: ALT 20; BUN 17; Creat 0.90; Potassium 4.5; Sodium 138    Lipid Panel    Component Value Date/Time   CHOL 101* 06/16/2015 0857   TRIG 65 06/16/2015 0857   HDL 46 06/16/2015 0857   CHOLHDL 2.2 06/16/2015 0857   VLDL 13 06/16/2015 0857   LDLCALC 42 06/16/2015 0857      Wt Readings from Last 3  Encounters:  08/02/15 191 lb (86.637 kg)  06/19/15 191 lb 3.2 oz (86.728 kg)  01/17/15 191 lb (86.637 kg)      Other studies Reviewed: Additional studies/ records that were reviewed today include: . Review of the above records demonstrates:    ASSESSMENT AND PLAN:  1. CAD ( CABG Oct. 6, 2003)  - he has been having some unusual episodes of CP and DOE.   myoview today is low risk.   No obvious ischemia.   LV 45%.  His symptoms are worrisome for angina  After reviewing his history, i'm concerned that he has progressive CAD. He's having angina with exercise and is typically quite active.   2. Dyslipidemia- doing well. Continue same meds,  check labs today.  3. Atrial fibrillation - occurred following CABG.  No recurrent atrial fibrillation at this point.  4. Depression - he sees Becky Kincaid,MS  5. HTN:  BP is well controlled.   Current medicines are reviewed at length with the patient today.  The patient does not have concerns regarding medicines.  The following changes have been made:  no change  Labs/ tests ordered today include:  No orders of the defined types were placed in this encounter.    Disposition:   FU with me in 6 months      Kristeen MissPhilip Marionna Gonia, MD  08/02/2015 1:47 PM    [facility] [address], [place], [place]  [address] Phone: [phone]; Fax: [phone]   [facility]  Office  [address] [place], [place]  [address] [phone]    Fax [phone]

## 2015-08-02 NOTE — Patient Instructions (Signed)
Medication Instructions:  Your physician recommends that you continue on your current medications as directed. Please refer to the Current Medication list given to you today.   Labwork: TODAY - CBC, basic metabolic panel, PT/INR   Testing/Procedures: Your physician has requested that you have a cardiac catheterization. Cardiac catheterization is used to diagnose and/or treat various heart conditions. Doctors may recommend this procedure for a number of different reasons. The most common reason is to evaluate chest pain. Chest pain can be a symptom of coronary artery disease (CAD), and cardiac catheterization can show whether plaque is narrowing or blocking your heart's arteries. This procedure is also used to evaluate the valves, as well as measure the blood flow and oxygen levels in different parts of your heart. For further information please visit [email or web address] Please follow instruction sheet, as given.    Follow-Up: Your physician recommends that you schedule a follow-up appointment in: 3 months with Dr. Elease HashimotoNahser   If you need a refill on your cardiac medications before your next appointment, please call your pharmacy.   Thank you for choosing [facility]! Eligha BridegroomMichelle Tracker Mance, RN [phone]

## 2015-08-03 ENCOUNTER — Encounter (HOSPITAL_COMMUNITY): Admission: RE | Admit: 2015-08-03 | Payer: Self-pay | Source: Ambulatory Visit

## 2015-08-03 ENCOUNTER — Telehealth: Payer: Self-pay | Admitting: Cardiovascular Disease

## 2015-08-03 LAB — PROTIME-INR
INR: 1.1 (ref <1.50)
PROTHROMBIN TIME: 14.3 s (ref 11.6–15.2)

## 2015-08-03 NOTE — Telephone Encounter (Signed)
New Message  Pt c/o medication issue: 1. Name of Medication: sulfamethoxazole-trimethoprim (BACTRIM DS,SEPTRA DS) 800-160 MG tablet   4. What is your medication issue? Pt states that this medication is upsetting his stomach. Pt request a call back to discuss if he should take it before the procedure. 1/2 dose or just skip. Will he need to take it tonight. Please call back to discuss.

## 2015-08-03 NOTE — Telephone Encounter (Signed)
Pt was unsure on whether it was ok to take Bactrim prior to procedure. Advised pt that it would be fine to go ahead and take it. Pt states that he may only take a 1/2 dose tonight as it is causing soft stools. Advised pt he could contact ordering physician about changing medication if side effects are intolerable. Pt states that he doesn't think that is necessary yet. Reminded pt to arrive at hospital at [facility]. Pt verbalized understanding.

## 2015-08-04 ENCOUNTER — Other Ambulatory Visit: Payer: Self-pay | Admitting: Cardiovascular Disease

## 2015-08-04 ENCOUNTER — Other Ambulatory Visit: Payer: Self-pay

## 2015-08-04 ENCOUNTER — Ambulatory Visit (HOSPITAL_COMMUNITY)
Admission: RE | Admit: 2015-08-04 | Discharge: 2015-08-04 | Disposition: A | Discharge status: Home / Self Care | Payer: Medicare Other | Source: Ambulatory Visit | Attending: Cardiovascular Disease | Admitting: Cardiovascular Disease

## 2015-08-04 ENCOUNTER — Encounter (HOSPITAL_COMMUNITY)
Admission: RE | Disposition: A | Discharge status: Home / Self Care | Payer: Self-pay | Source: Ambulatory Visit | Attending: Cardiovascular Disease

## 2015-08-04 ENCOUNTER — Encounter (HOSPITAL_COMMUNITY): Payer: Self-pay | Admitting: Cardiovascular Disease

## 2015-08-04 ENCOUNTER — Telehealth: Payer: Self-pay | Admitting: Cardiovascular Disease

## 2015-08-04 DIAGNOSIS — I4891 Unspecified atrial fibrillation: Secondary | ICD-10-CM | POA: Insufficient documentation

## 2015-08-04 DIAGNOSIS — Z955 Presence of coronary angioplasty implant and graft: Secondary | ICD-10-CM | POA: Insufficient documentation

## 2015-08-04 DIAGNOSIS — E785 Hyperlipidemia, unspecified: Secondary | ICD-10-CM | POA: Insufficient documentation

## 2015-08-04 DIAGNOSIS — I2089 Other forms of angina pectoris: Secondary | ICD-10-CM | POA: Diagnosis present

## 2015-08-04 DIAGNOSIS — Z880 Allergy status to penicillin: Secondary | ICD-10-CM | POA: Diagnosis not present

## 2015-08-04 DIAGNOSIS — I25119 Atherosclerotic heart disease of native coronary artery with unspecified angina pectoris: Secondary | ICD-10-CM | POA: Diagnosis present

## 2015-08-04 DIAGNOSIS — I25118 Atherosclerotic heart disease of native coronary artery with other forms of angina pectoris: Secondary | ICD-10-CM | POA: Diagnosis not present

## 2015-08-04 DIAGNOSIS — Z7982 Long term (current) use of aspirin: Secondary | ICD-10-CM | POA: Diagnosis not present

## 2015-08-04 DIAGNOSIS — I2510 Atherosclerotic heart disease of native coronary artery without angina pectoris: Secondary | ICD-10-CM | POA: Diagnosis not present

## 2015-08-04 DIAGNOSIS — I25111 Atherosclerotic heart disease of native coronary artery with angina pectoris with documented spasm: Secondary | ICD-10-CM

## 2015-08-04 DIAGNOSIS — I10 Essential (primary) hypertension: Secondary | ICD-10-CM | POA: Insufficient documentation

## 2015-08-04 DIAGNOSIS — I2582 Chronic total occlusion of coronary artery: Secondary | ICD-10-CM | POA: Insufficient documentation

## 2015-08-04 DIAGNOSIS — F329 Major depressive disorder, single episode, unspecified: Secondary | ICD-10-CM | POA: Diagnosis not present

## 2015-08-04 DIAGNOSIS — I208 Other forms of angina pectoris: Secondary | ICD-10-CM | POA: Diagnosis present

## 2015-08-04 HISTORY — PX: CARDIAC CATHETERIZATION: SHX172

## 2015-08-04 SURGERY — LEFT HEART CATH AND CORS/GRAFTS ANGIOGRAPHY

## 2015-08-04 MED ORDER — HEPARIN (PORCINE) IN NACL 2-0.9 UNIT/ML-% IJ SOLN
INTRAMUSCULAR | Status: DC | PRN
  Administered 2015-08-04: 10:00:00

## 2015-08-04 MED ORDER — SODIUM CHLORIDE 0.9% FLUSH: 3.0000 mL | Freq: Two times a day (BID) | INTRAVENOUS | Status: DC

## 2015-08-04 MED ORDER — FENTANYL CITRATE (PF) 100 MCG/2ML IJ SOLN
INTRAMUSCULAR | Status: AC
  Filled 2015-08-04: qty 2

## 2015-08-04 MED ORDER — HEPARIN (PORCINE) IN NACL 2-0.9 UNIT/ML-% IJ SOLN
INTRAMUSCULAR | Status: AC
Start: 2015-08-04 — End: 2015-08-04
  Filled 2015-08-04: qty 1000

## 2015-08-04 MED ORDER — ONDANSETRON HCL 4 MG/2ML IJ SOLN: 4.0000 mg | Freq: Four times a day (QID) | INTRAMUSCULAR | Status: DC | PRN

## 2015-08-04 MED ORDER — FENTANYL CITRATE (PF) 100 MCG/2ML IJ SOLN
INTRAMUSCULAR | Status: DC | PRN
  Administered 2015-08-04: 25 ug via INTRAVENOUS

## 2015-08-04 MED ORDER — SODIUM CHLORIDE 0.9 % IV SOLN: INTRAVENOUS | Status: DC

## 2015-08-04 MED ORDER — ASPIRIN 81 MG PO CHEW
81.0000 mg | CHEWABLE_TABLET | ORAL | Status: AC
  Administered 2015-08-04: 81 mg via ORAL

## 2015-08-04 MED ORDER — SODIUM CHLORIDE 0.9 % WEIGHT BASED INFUSION
1.0000 mL/kg/h | INTRAVENOUS | Status: DC
Start: 2015-08-04 — End: 2015-08-04

## 2015-08-04 MED ORDER — SODIUM CHLORIDE 0.9% FLUSH: 3.0000 mL | INTRAVENOUS | Status: DC | PRN

## 2015-08-04 MED ORDER — NITROGLYCERIN 0.4 MG SL SUBL: 0.4000 mg | SUBLINGUAL_TABLET | SUBLINGUAL | Status: DC | PRN

## 2015-08-04 MED ORDER — IOPAMIDOL (ISOVUE-370) INJECTION 76%
INTRAVENOUS | Status: DC | PRN
  Administered 2015-08-04: 100 mL via INTRAVENOUS

## 2015-08-04 MED ORDER — MIDAZOLAM HCL 2 MG/2ML IJ SOLN
INTRAMUSCULAR | Status: DC | PRN
  Administered 2015-08-04: 2 mg via INTRAVENOUS

## 2015-08-04 MED ORDER — SODIUM CHLORIDE 0.9 % WEIGHT BASED INFUSION
3.0000 mL/kg/h | INTRAVENOUS | Status: AC
  Administered 2015-08-04: 3 mL/kg/h via INTRAVENOUS

## 2015-08-04 MED ORDER — IOPAMIDOL (ISOVUE-370) INJECTION 76%
INTRAVENOUS | Status: AC
  Filled 2015-08-04: qty 125

## 2015-08-04 MED ORDER — ACETAMINOPHEN 325 MG PO TABS: 650.0000 mg | ORAL_TABLET | ORAL | Status: DC | PRN

## 2015-08-04 MED ORDER — ASPIRIN 81 MG PO CHEW
CHEWABLE_TABLET | ORAL | Status: AC
  Filled 2015-08-04: qty 1

## 2015-08-04 MED ORDER — MIDAZOLAM HCL 2 MG/2ML IJ SOLN
INTRAMUSCULAR | Status: AC
  Filled 2015-08-04: qty 2

## 2015-08-04 MED ORDER — ISOSORBIDE MONONITRATE ER 30 MG PO TB24: 30.0000 mg | ORAL_TABLET | Freq: Every day | ORAL | Status: DC

## 2015-08-04 MED ORDER — LIDOCAINE HCL (PF) 1 % IJ SOLN
INTRAMUSCULAR | Status: AC
Start: 2015-08-04 — End: 2015-08-04
  Filled 2015-08-04: qty 30

## 2015-08-04 MED ORDER — LIDOCAINE HCL (PF) 1 % IJ SOLN
INTRAMUSCULAR | Status: DC | PRN
  Administered 2015-08-04: 15 mL via SUBCUTANEOUS

## 2015-08-04 MED ORDER — SODIUM CHLORIDE 0.9 % IV SOLN: 250.0000 mL | INTRAVENOUS | Status: DC | PRN

## 2015-08-04 SURGICAL SUPPLY — 10 items
CATH INFINITI 5FR MULTPACK ANG (CATHETERS) ×2 IMPLANT
GLIDESHEATH SLEND SS 6F .021 (SHEATH) IMPLANT
KIT HEART LEFT (KITS) ×3 IMPLANT
PACK CARDIAC CATHETERIZATION (CUSTOM PROCEDURE TRAY) ×3 IMPLANT
SHEATH PINNACLE 5F 10CM (SHEATH) ×2 IMPLANT
TRANSDUCER W/STOPCOCK (MISCELLANEOUS) ×3 IMPLANT
TUBING CIL FLEX 10 FLL-RA (TUBING) ×3 IMPLANT
WIRE EMERALD 3MM-J .035X150CM (WIRE) ×2 IMPLANT
WIRE HI TORQ VERSACORE-J 145CM (WIRE) ×2 IMPLANT
WIRE SAFE-T 1.5MM-J .035X260CM (WIRE) IMPLANT

## 2015-08-04 NOTE — Telephone Encounter (Signed)
Cath today revealed an occluded graft. The IMA and remaining grafts look good I reassured William NajjarLarry that this was a stable situation He has some angina on occasion when playing basketball Will send i script for Imdur 30 mg a day  And refill SL NTG  Will see him at next appt.    Kristeen MissPhilip Nahser, MD  08/04/2015 7:05 PM    [facility] [address],  [address] [place], [place]  [address] Pager [phone] Phone: [phone]; Fax: [phone]

## 2015-08-04 NOTE — Progress Notes (Signed)
Site area: rt groin Site Prior to Removal:  Level 0 Pressure Applied For:  20 minutes Manual:   yes Patient Status During Pull:  stable Post Pull Site:  Level  0 Post Pull Instructions Given:  yes Post Pull Pulses Present: yes Dressing Applied:  tegaderm Bedrest begins @ 1040 Comments:   

## 2015-08-04 NOTE — Interval H&P Note (Signed)
Cath Lab Visit (complete for each Cath Lab visit)  Clinical Evaluation Leading to the Procedure:   ACS: No.  Non-ACS:    Anginal Classification: CCS II  Anti-ischemic medical therapy: Minimal Therapy (1 class of medications)  Non-Invasive Test Results: Low-risk stress test findings: cardiac mortality <1%/year  Prior CABG: Previous CABG      History and Physical Interval Note:  08/04/2015 9:25 AM  William Hernandez  has presented today for surgery, with the diagnosis of unstable angina  The various methods of treatment have been discussed with the patient and family. After consideration of risks, benefits and other options for treatment, the patient has consented to  Procedure(s): Left Heart Cath and Cors/Grafts Angiography (N/A) as a surgical intervention .  The patient's history has been reviewed, patient examined, no change in status, stable for surgery.  I have reviewed the patient's chart and labs.  Questions were answered to the patient's satisfaction.     Tonny Bollmanooper, Zenna Traister

## 2015-08-04 NOTE — H&P (View-Only) (Signed)
 Cardiology Office Note   Date:  08/02/2015   ID:  William Hernandez, DOB 04/06/1945, MRN [id number]  PCP:  ELKINS,WILSON OLIVER, MD  Cardiologist:   Philip Nahser, MD   Chief Complaint  Patient presents with  . Coronary Artery Disease   1. CAD ( CABG Oct. 6, 2003)  2. Dyslipidemia 3. Atrial fibrillation - post op from CABG.  4. Depression  History of Present Illness:  William Hernandez is a 70-year-old gentleman with a history of coronary disease-status post coronary artery bypass grafting. He's done very well from a cardiac standpoint. He is participating in cardiac rehabilitation and has not had any episodes of chest pain or shortness breath.  He has done well. His lipids are well controlled.   He is participating in cardiac rehab and is doing well. 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 He doesn't have any episodes of chest pain or shortness breath when he is active. He also competed in the [place] senior Olympics basketball shooting contest.  He continues to have issues with anxiety.   July 14, 2012:  William Hernandez is doing well. He had a nose bleed several weeks ago. He is exercising regularly. No angina. No dyspnea.  Oct. 27, 2014:  He is doing well. 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 2 very hard games of 3 on 3 , half court game. No pain. Active. Exercises. Increased his Trazadone to help with some additional depression.   July 14 2013:  William Hernandez is doing well. He remains very active. Plays in the Senior games. No CP. Has has a URI for the past 10 days which has slowed him down a bit.   Oct. 26, 2015:  William Hernandez is doing well.  Bp is well controlled. Doing lots of fishing. Plays basketball on Wednesdays. No Cp .      Jul 21, 2014:  William Hernandez is a 70 y.o. male who presents for follow up of his CAD and hyperlipidemia Still exercising well.   Still playing basketball regularly.  No angina     01/17/2015: Doing well from a cardiac standpoint. Has had a head cold - difficulty hearing in his left ear   June 19, 2015:  William Hernandez is seen for follow up visit .  Still playing basketball regularly . No  CP or dyspnea.     Aug 02, 2015:  William Hernandez is seen back for an episode CP that occurred in Cardiac rehab yesterday   Feels it at cardiac rehab Also when he first starts out playing basket ball After he gets warmed up, he feels fine.  Also occurs when he is mowing the grass.    myoview today shows no large area of ishcmeia. LV function is mildly depressed with EF 45%     Past Medical History  Diagnosis Date  . Hyperlipidemia   . Arrhythmia     afib  . Depression   . Coronary artery disease     2003  . S/P hernia surgery     Past Surgical History  Procedure Laterality Date  . Hernia repair    . Cholecystectomy    . Cardiac catheterization      11/2001  . Coronary artery bypass graft  2000     Current Outpatient Prescriptions  Medication Sig Dispense Refill  . aspirin 81 MG tablet Take 81 mg by mouth daily.    . atorvastatin (LIPITOR) 20 MG tablet TAKE 1 TABLET   BY MOUTH ONCE DAILY 90 tablet 3  . BYSTOLIC 5 MG tablet TAKE 1 TABLET BY MOUTH DAILY. 30 tablet 11  . fish oil-omega-3 fatty acids 1000 MG capsule Take 1 g by mouth daily.     . Flaxseed, Linseed, (FLAX SEED OIL PO) Take 1 tablet by mouth daily.     . FLUZONE HIGH-DOSE 0.5 ML SUSY Inject as directed once.  0  . irbesartan (AVAPRO) 150 MG tablet TAKE 1/2 TABLET BY MOUTH AT BEDTIME. 45 tablet 1  . Multiple Vitamin (MULTIVITAMIN PO) Take by mouth daily.      . ranitidine (ZANTAC) 150 MG capsule Take 150 mg by mouth every evening.      . sildenafil (REVATIO) 20 MG tablet Use 1 to 5 pills at a time as needed (Patient taking differently: Take 20 mg by mouth 3 (three) times daily. Use 1 to 5 pills at a time as needed) 30 tablet 5  . TRAZODONE HCL PO Take 200 mg by mouth at bedtime as needed (AS NEEDED FOR SLEEP).       No current facility-administered medications for this visit.    Allergies:   Lisinopril and Penicillins    Social History:  The patient  reports that he has never smoked. He does not have any smokeless tobacco history on file. He reports that he does not drink alcohol or use illicit drugs.   Family History:  The patient's family history includes Heart disease in his father.    ROS:  Please see the history of present illness.    Review of Systems: Constitutional:  denies fever, chills, diaphoresis, appetite change and fatigue.  HEENT: denies photophobia, eye pain, redness, hearing loss, ear pain, congestion, sore throat, rhinorrhea, sneezing, neck pain, neck stiffness and tinnitus.  Respiratory: denies SOB, DOE, cough, chest tightness, and wheezing.  Cardiovascular: denies chest pain, palpitations and leg swelling.  Gastrointestinal: denies nausea, vomiting, abdominal pain, diarrhea, constipation, blood in stool.  Genitourinary: denies dysuria, urgency, frequency, hematuria, flank pain and difficulty urinating.  Musculoskeletal: denies  myalgias, back pain, joint swelling, arthralgias and gait problem.   Skin: denies pallor, rash and wound.  Neurological: denies dizziness, seizures, syncope, weakness, light-headedness, numbness and headaches.   Hematological: denies adenopathy, easy bruising, personal or family bleeding history.  Psychiatric/ Behavioral: denies suicidal ideation, mood changes, confusion, nervousness, sleep disturbance and agitation.       All other systems are reviewed and negative.    PHYSICAL EXAM: VS:  There were no vitals taken for this visit. , BMI There is no weight on file to calculate BMI. GEN: Well nourished, well developed, in no acute distress HEENT: normal Neck: no JVD, carotid bruits, or masses Cardiac: RRR; no murmurs, rubs, or gallops,no edema  Respiratory:  clear to auscultation bilaterally, normal work of breathing GI: soft, nontender,  nondistended, + BS MS: no deformity or atrophy Skin: warm and dry, no rash Neuro:  Strength and sensation are intact Psych: normal   EKG:  EKG is ordered today. The ekg ordered today demonstrates sinus brady at 52.  Inc. RBBB.    Recent Labs: 06/16/2015: ALT 20; BUN 17; Creat 0.90; Potassium 4.5; Sodium 138    Lipid Panel    Component Value Date/Time   CHOL 101* 06/16/2015 0857   TRIG 65 06/16/2015 0857   HDL 46 06/16/2015 0857   CHOLHDL 2.2 06/16/2015 0857   VLDL 13 06/16/2015 0857   LDLCALC 42 06/16/2015 0857      Wt Readings from Last 3   Encounters:  08/02/15 191 lb (86.637 kg)  06/19/15 191 lb 3.2 oz (86.728 kg)  01/17/15 191 lb (86.637 kg)      Other studies Reviewed: Additional studies/ records that were reviewed today include: . Review of the above records demonstrates:    ASSESSMENT AND PLAN:  1. CAD ( CABG Oct. 6, 2003)  - he has been having some unusual episodes of CP and DOE.   myoview today is low risk.   No obvious ischemia.   LV 45%.  His symptoms are worrisome for angina  After reviewing his history, i'm concerned that he has progressive CAD. He's having angina with exercise and is typically quite active.   2. Dyslipidemia- doing well. Continue same meds,  check labs today.  3. Atrial fibrillation - occurred following CABG.  No recurrent atrial fibrillation at this point.  4. Depression - he sees Becky Kincaid,MS  5. HTN:  BP is well controlled.   Current medicines are reviewed at length with the patient today.  The patient does not have concerns regarding medicines.  The following changes have been made:  no change  Labs/ tests ordered today include:  No orders of the defined types were placed in this encounter.    Disposition:   FU with me in 6 months      Philip Nahser, MD  08/02/2015 1:47 PM     [facility] [address], [place], [place]  [address] Phone: [phone]; Fax: [phone]   [place]  Office  [address] [place], [place]  [address] [phone]    Fax [phone]   

## 2015-08-04 NOTE — Discharge Instructions (Signed)
Angiogram, Care After °Refer to this sheet in the next few weeks. These instructions provide you with information about caring for yourself after your procedure. Your health care provider may also give you more specific instructions. Your treatment has been planned according to current medical practices, but problems sometimes occur. Call your health care provider if you have any problems or questions after your procedure. °WHAT TO EXPECT AFTER THE PROCEDURE °After your procedure, it is typical to have the following: °· Bruising at the catheter insertion site that usually fades within 1-2 weeks. °· Blood collecting in the tissue (hematoma) that may be painful to the touch. It should usually decrease in size and tenderness within 1-2 weeks. °HOME CARE INSTRUCTIONS °· Take medicines only as directed by your health care provider. °· You may shower 24-48 hours after the procedure or as directed by your health care provider. Remove the bandage (dressing) and gently wash the site with plain soap and water. Pat the area dry with a clean towel. Do not rub the site, because this may cause bleeding. °· Do not take baths, swim, or use a hot tub until your health care provider approves. °· Check your insertion site every day for redness, swelling, or drainage. °· Do not apply powder or lotion to the site. °· Do not lift over 10 lb (4.5 kg) for 5 days after your procedure or as directed by your health care provider. °· Ask your health care provider when it is okay to: °¨ Return to work or school. °¨ Resume usual physical activities or sports. °¨ Resume sexual activity. °· Do not drive home if you are discharged the same day as the procedure. Have someone else drive you. °· You may drive 24 hours after the procedure unless otherwise instructed by your health care provider. °· Do not operate machinery or power tools for 24 hours after the procedure or as directed by your health care provider. °· If your procedure was done as an  outpatient procedure, which means that you went home the same day as your procedure, a responsible adult should be with you for the first 24 hours after you arrive home. °· Keep all follow-up visits as directed by your health care provider. This is important. °SEEK MEDICAL CARE IF: °· You have a fever. °· You have chills. °· You have increased bleeding from the catheter insertion site. Hold pressure on the site.  CALL 911 °SEEK IMMEDIATE MEDICAL CARE IF: °· You have unusual pain at the catheter insertion site. °· You have redness, warmth, or swelling at the catheter insertion site. °· You have drainage (other than a small amount of blood on the dressing) from the catheter insertion site. °· The catheter insertion site is bleeding, and the bleeding does not stop after 30 minutes of holding steady pressure on the site. °· The area near or just beyond the catheter insertion site becomes pale, cool, tingly, or numb. °  °This information is not intended to replace advice given to you by your health care provider. Make sure you discuss any questions you have with your health care provider. °  °Document Released: 09/20/2004 Document Revised: 03/25/2014 Document Reviewed: 08/05/2012 °Elsevier Interactive Patient Education ©2016 [employer] ° °

## 2015-08-08 ENCOUNTER — Encounter (HOSPITAL_COMMUNITY): Admission: RE | Admit: 2015-08-08 | Payer: Self-pay | Source: Ambulatory Visit

## 2015-08-08 ENCOUNTER — Telehealth: Payer: Self-pay | Admitting: Cardiovascular Disease

## 2015-08-08 NOTE — Telephone Encounter (Signed)
Pt has questions regarding med and pt needs faxed referral saying ok to go back to cardiac rehab Att: Boneta LucksAndrea Stackhouse -pt was not given fax number-  pls call pt [phone]

## 2015-08-08 NOTE — Telephone Encounter (Signed)
Pt called to have Phase II Cardiac Rehab paperwork sent over. Paper work completed and awaiting MD signature.  Pt also stated he does not feel comfortable taking Imdur knowing the side effects and how sensitive he is to medications; in addition he stated he would not be able to take his Sildenafil. Pt stated he is feeling fine and does not want to mess that up by starting to take a new medication. Told pt would send over paper and update MD on his current medication plan. Pt verbalized understanding, no additional questions at this time.

## 2015-08-08 NOTE — Telephone Encounter (Signed)
Agree with the plan to hold Imdur for now

## 2015-08-09 ENCOUNTER — Encounter (HOSPITAL_COMMUNITY): Admission: RE | Admit: 2015-08-09 | Payer: Self-pay | Source: Ambulatory Visit

## 2015-08-09 ENCOUNTER — Other Ambulatory Visit: Payer: Self-pay | Admitting: Cardiovascular Disease

## 2015-08-09 NOTE — Telephone Encounter (Signed)
Coumadin refill. Thank you for your time. 

## 2015-08-10 ENCOUNTER — Encounter (HOSPITAL_COMMUNITY)
Admission: RE | Admit: 2015-08-10 | Discharge: 2015-08-10 | Disposition: A | Discharge status: Home / Self Care | Payer: Self-pay | Source: Ambulatory Visit | Attending: Cardiovascular Disease | Admitting: Cardiovascular Disease

## 2015-08-15 ENCOUNTER — Encounter (HOSPITAL_COMMUNITY)
Admission: RE | Admit: 2015-08-15 | Discharge: 2015-08-15 | Disposition: A | Discharge status: Home / Self Care | Payer: Self-pay | Source: Ambulatory Visit | Attending: Cardiovascular Disease | Admitting: Cardiovascular Disease

## 2015-08-16 ENCOUNTER — Encounter (HOSPITAL_COMMUNITY): Payer: Self-pay

## 2015-08-17 ENCOUNTER — Encounter (HOSPITAL_COMMUNITY): Payer: Self-pay

## 2015-08-17 DIAGNOSIS — Z951 Presence of aortocoronary bypass graft: Secondary | ICD-10-CM | POA: Insufficient documentation

## 2015-08-22 ENCOUNTER — Encounter (HOSPITAL_COMMUNITY)
Admission: RE | Admit: 2015-08-22 | Discharge: 2015-08-22 | Disposition: A | Discharge status: Home / Self Care | Payer: Self-pay | Source: Ambulatory Visit | Attending: Cardiovascular Disease | Admitting: Cardiovascular Disease

## 2015-08-23 ENCOUNTER — Encounter (HOSPITAL_COMMUNITY): Payer: Self-pay

## 2015-08-24 ENCOUNTER — Encounter (HOSPITAL_COMMUNITY): Payer: Self-pay

## 2015-08-29 ENCOUNTER — Encounter (HOSPITAL_COMMUNITY)
Admission: RE | Admit: 2015-08-29 | Discharge: 2015-08-29 | Disposition: A | Discharge status: Home / Self Care | Payer: Self-pay | Source: Ambulatory Visit | Attending: Cardiovascular Disease | Admitting: Cardiovascular Disease

## 2015-08-30 ENCOUNTER — Encounter (HOSPITAL_COMMUNITY): Payer: Self-pay

## 2015-08-31 ENCOUNTER — Encounter (HOSPITAL_COMMUNITY): Payer: Self-pay

## 2015-09-05 ENCOUNTER — Encounter (HOSPITAL_COMMUNITY)
Admission: RE | Admit: 2015-09-05 | Discharge: 2015-09-05 | Disposition: A | Discharge status: Home / Self Care | Payer: Self-pay | Source: Ambulatory Visit | Attending: Cardiovascular Disease | Admitting: Cardiovascular Disease

## 2015-09-06 ENCOUNTER — Encounter (HOSPITAL_COMMUNITY): Payer: Self-pay

## 2015-09-07 ENCOUNTER — Encounter (HOSPITAL_COMMUNITY)
Admission: RE | Admit: 2015-09-07 | Discharge: 2015-09-07 | Disposition: A | Discharge status: Home / Self Care | Payer: Self-pay | Source: Ambulatory Visit | Attending: Cardiovascular Disease | Admitting: Cardiovascular Disease

## 2015-09-12 ENCOUNTER — Encounter (HOSPITAL_COMMUNITY)
Admission: RE | Admit: 2015-09-12 | Discharge: 2015-09-12 | Disposition: A | Discharge status: Home / Self Care | Payer: Self-pay | Source: Ambulatory Visit | Attending: Cardiovascular Disease | Admitting: Cardiovascular Disease

## 2015-09-13 ENCOUNTER — Encounter (HOSPITAL_COMMUNITY): Payer: Self-pay

## 2015-09-14 ENCOUNTER — Encounter (HOSPITAL_COMMUNITY)
Admission: RE | Admit: 2015-09-14 | Discharge: 2015-09-14 | Disposition: A | Discharge status: Home / Self Care | Payer: Self-pay | Source: Ambulatory Visit | Attending: Cardiovascular Disease | Admitting: Cardiovascular Disease

## 2015-09-20 ENCOUNTER — Other Ambulatory Visit: Payer: Self-pay | Admitting: Cardiovascular Disease

## 2015-09-20 ENCOUNTER — Encounter (HOSPITAL_COMMUNITY): Payer: Self-pay

## 2015-09-20 DIAGNOSIS — Z951 Presence of aortocoronary bypass graft: Secondary | ICD-10-CM | POA: Insufficient documentation

## 2015-09-21 ENCOUNTER — Encounter (HOSPITAL_COMMUNITY): Payer: Self-pay

## 2015-09-26 ENCOUNTER — Encounter (HOSPITAL_COMMUNITY)
Admission: RE | Admit: 2015-09-26 | Discharge: 2015-09-26 | Disposition: A | Discharge status: Home / Self Care | Payer: Self-pay | Source: Ambulatory Visit | Attending: Cardiovascular Disease | Admitting: Cardiovascular Disease

## 2015-09-27 ENCOUNTER — Encounter (HOSPITAL_COMMUNITY): Payer: Self-pay

## 2015-09-28 ENCOUNTER — Encounter (HOSPITAL_COMMUNITY)
Admission: RE | Admit: 2015-09-28 | Discharge: 2015-09-28 | Disposition: A | Discharge status: Home / Self Care | Payer: Self-pay | Source: Ambulatory Visit | Attending: Cardiovascular Disease | Admitting: Cardiovascular Disease

## 2015-10-03 ENCOUNTER — Encounter (HOSPITAL_COMMUNITY)
Admission: RE | Admit: 2015-10-03 | Discharge: 2015-10-03 | Disposition: A | Discharge status: Home / Self Care | Payer: Self-pay | Source: Ambulatory Visit | Attending: Cardiovascular Disease | Admitting: Cardiovascular Disease

## 2015-10-04 ENCOUNTER — Encounter (HOSPITAL_COMMUNITY): Payer: Self-pay

## 2015-10-05 ENCOUNTER — Encounter (HOSPITAL_COMMUNITY)
Admission: RE | Admit: 2015-10-05 | Discharge: 2015-10-05 | Disposition: A | Discharge status: Home / Self Care | Payer: Self-pay | Source: Ambulatory Visit | Attending: Cardiovascular Disease | Admitting: Cardiovascular Disease

## 2015-10-10 ENCOUNTER — Encounter (HOSPITAL_COMMUNITY)
Admission: RE | Admit: 2015-10-10 | Discharge: 2015-10-10 | Disposition: A | Discharge status: Home / Self Care | Payer: Self-pay | Source: Ambulatory Visit | Attending: Cardiovascular Disease | Admitting: Cardiovascular Disease

## 2015-10-11 ENCOUNTER — Encounter (HOSPITAL_COMMUNITY): Payer: Self-pay

## 2015-10-12 ENCOUNTER — Encounter (HOSPITAL_COMMUNITY)
Admission: RE | Admit: 2015-10-12 | Discharge: 2015-10-12 | Disposition: A | Discharge status: Home / Self Care | Payer: Self-pay | Source: Ambulatory Visit | Attending: Cardiovascular Disease | Admitting: Cardiovascular Disease

## 2015-10-17 ENCOUNTER — Encounter (HOSPITAL_COMMUNITY)
Admission: RE | Admit: 2015-10-17 | Discharge: 2015-10-17 | Disposition: A | Discharge status: Home / Self Care | Payer: Self-pay | Source: Ambulatory Visit | Attending: Cardiovascular Disease | Admitting: Cardiovascular Disease

## 2015-10-17 DIAGNOSIS — Z951 Presence of aortocoronary bypass graft: Secondary | ICD-10-CM | POA: Insufficient documentation

## 2015-10-18 ENCOUNTER — Encounter (HOSPITAL_COMMUNITY): Payer: Self-pay

## 2015-10-19 ENCOUNTER — Encounter (HOSPITAL_COMMUNITY): Admission: RE | Admit: 2015-10-19 | Payer: Self-pay | Source: Ambulatory Visit

## 2015-10-20 ENCOUNTER — Encounter (HOSPITAL_COMMUNITY)
Admission: RE | Admit: 2015-10-20 | Discharge: 2015-10-20 | Disposition: A | Discharge status: Home / Self Care | Payer: Self-pay | Source: Ambulatory Visit | Attending: Cardiovascular Disease | Admitting: Cardiovascular Disease

## 2015-10-24 ENCOUNTER — Encounter (HOSPITAL_COMMUNITY)
Admission: RE | Admit: 2015-10-24 | Discharge: 2015-10-24 | Disposition: A | Discharge status: Home / Self Care | Payer: Self-pay | Source: Ambulatory Visit | Attending: Cardiovascular Disease | Admitting: Cardiovascular Disease

## 2015-10-25 ENCOUNTER — Encounter (HOSPITAL_COMMUNITY): Payer: Self-pay

## 2015-10-26 ENCOUNTER — Encounter (HOSPITAL_COMMUNITY)
Admission: RE | Admit: 2015-10-26 | Discharge: 2015-10-26 | Disposition: A | Discharge status: Home / Self Care | Payer: Self-pay | Source: Ambulatory Visit | Attending: Cardiovascular Disease | Admitting: Cardiovascular Disease

## 2015-10-31 ENCOUNTER — Encounter (HOSPITAL_COMMUNITY)
Admission: RE | Admit: 2015-10-31 | Discharge: 2015-10-31 | Disposition: A | Discharge status: Home / Self Care | Payer: Self-pay | Source: Ambulatory Visit | Attending: Cardiovascular Disease | Admitting: Cardiovascular Disease

## 2015-11-01 ENCOUNTER — Encounter (HOSPITAL_COMMUNITY): Payer: Self-pay

## 2015-11-02 ENCOUNTER — Encounter (HOSPITAL_COMMUNITY)
Admission: RE | Admit: 2015-11-02 | Discharge: 2015-11-02 | Disposition: A | Discharge status: Home / Self Care | Payer: Self-pay | Source: Ambulatory Visit | Attending: Cardiovascular Disease | Admitting: Cardiovascular Disease

## 2015-11-07 ENCOUNTER — Encounter (HOSPITAL_COMMUNITY)
Admission: RE | Admit: 2015-11-07 | Discharge: 2015-11-07 | Disposition: A | Discharge status: Home / Self Care | Payer: Self-pay | Source: Ambulatory Visit | Attending: Cardiovascular Disease | Admitting: Cardiovascular Disease

## 2015-11-08 ENCOUNTER — Encounter (HOSPITAL_COMMUNITY): Payer: Self-pay

## 2015-11-09 ENCOUNTER — Encounter (HOSPITAL_COMMUNITY): Payer: Self-pay

## 2015-11-09 ENCOUNTER — Encounter: Payer: Self-pay | Admitting: Cardiovascular Disease

## 2015-11-14 ENCOUNTER — Encounter (HOSPITAL_COMMUNITY)
Admission: RE | Admit: 2015-11-14 | Discharge: 2015-11-14 | Disposition: A | Discharge status: Home / Self Care | Payer: Self-pay | Source: Ambulatory Visit | Attending: Cardiovascular Disease | Admitting: Cardiovascular Disease

## 2015-11-15 ENCOUNTER — Encounter (HOSPITAL_COMMUNITY): Payer: Self-pay

## 2015-11-16 ENCOUNTER — Encounter (HOSPITAL_COMMUNITY): Payer: Self-pay

## 2015-11-21 ENCOUNTER — Encounter (HOSPITAL_COMMUNITY): Payer: 59

## 2015-11-21 DIAGNOSIS — Z951 Presence of aortocoronary bypass graft: Secondary | ICD-10-CM | POA: Insufficient documentation

## 2015-11-22 ENCOUNTER — Encounter (HOSPITAL_COMMUNITY): Payer: 59

## 2015-11-23 ENCOUNTER — Encounter (HOSPITAL_COMMUNITY)
Admission: RE | Admit: 2015-11-23 | Discharge: 2015-11-23 | Disposition: A | Discharge status: Home / Self Care | Payer: 59 | Source: Ambulatory Visit | Attending: Cardiovascular Disease | Admitting: Cardiovascular Disease

## 2015-11-23 ENCOUNTER — Ambulatory Visit (INDEPENDENT_AMBULATORY_CARE_PROVIDER_SITE_OTHER): Payer: Medicare Other | Admitting: Cardiovascular Disease

## 2015-11-23 ENCOUNTER — Encounter: Payer: Self-pay | Admitting: Cardiovascular Disease

## 2015-11-23 ENCOUNTER — Other Ambulatory Visit: Payer: Medicare Other

## 2015-11-23 ENCOUNTER — Encounter (INDEPENDENT_AMBULATORY_CARE_PROVIDER_SITE_OTHER): Payer: Self-pay

## 2015-11-23 VITALS — BP 120/78 | HR 55 | Ht 68.0 in | Wt 187.6 lb

## 2015-11-23 DIAGNOSIS — I2510 Atherosclerotic heart disease of native coronary artery without angina pectoris: Secondary | ICD-10-CM | POA: Diagnosis not present

## 2015-11-23 DIAGNOSIS — E785 Hyperlipidemia, unspecified: Secondary | ICD-10-CM

## 2015-11-23 DIAGNOSIS — I25111 Atherosclerotic heart disease of native coronary artery with angina pectoris with documented spasm: Secondary | ICD-10-CM

## 2015-11-23 LAB — LIPID PANEL
Cholesterol: 115 mg/dL — ABNORMAL LOW (ref 125–200)
HDL: 52 mg/dL (ref >=40)
LDL Cholesterol: 49 mg/dL (ref <130)
TRIGLYCERIDES: 72 mg/dL (ref <150)
Total CHOL/HDL Ratio: 2.2 Ratio (ref <=5.0)
VLDL: 14 mg/dL (ref <30)

## 2015-11-23 LAB — COMPREHENSIVE METABOLIC PANEL
ALBUMIN: 4.1 g/dL (ref 3.6–5.1)
ALT: 18 U/L (ref 9–46)
AST: 16 U/L (ref 10–35)
Alkaline Phosphatase: 51 U/L (ref 40–115)
BUN: 15 mg/dL (ref 7–25)
CHLORIDE: 101 mmol/L (ref 98–110)
CO2: 27 mmol/L (ref 20–31)
CREATININE: 0.93 mg/dL (ref 0.70–1.18)
Calcium: 9.4 mg/dL (ref 8.6–10.3)
Glucose, Bld: 104 mg/dL — ABNORMAL HIGH (ref 65–99)
POTASSIUM: 4.3 mmol/L (ref 3.5–5.3)
SODIUM: 135 mmol/L (ref 135–146)
TOTAL PROTEIN: 6.7 g/dL (ref 6.1–8.1)
Total Bilirubin: 1.9 mg/dL — ABNORMAL HIGH (ref 0.2–1.2)

## 2015-11-23 MED ORDER — NITROGLYCERIN 0.4 MG SL SUBL: 0.4000 mg | SUBLINGUAL_TABLET | SUBLINGUAL | 3 refills | Status: DC | PRN

## 2015-11-23 NOTE — Patient Instructions (Signed)
Medication Instructions:  Your physician recommends that you continue on your current medications as directed. Please refer to the Current Medication list given to you today.   Labwork: TODAY - cholesterol, complete metabolic panel   Testing/Procedures: None Ordered   Follow-Up: Your physician wants you to follow-up in: 6 months with Dr. Nahser.  You will receive a reminder letter in the mail two months in advance. If you don't receive a letter, please call our office to schedule the follow-up appointment.   If you need a refill on your cardiac medications before your next appointment, please call your pharmacy.   Thank you for choosing [facility]! Maudell Stanbrough, RN [phone]    

## 2015-11-23 NOTE — Progress Notes (Signed)
Cardiology Office Note   Date:  11/23/2015   ID:  William Hernandez, DOB 01-Jan-1946, MRN [id number]  PCP:  Kaleen Mask, MD  Cardiologist:   Kristeen Miss, MD   No chief complaint on file.  1. CAD ( CABG Oct. 6, 2003)  2. Dyslipidemia 3. Atrial fibrillation - post op from CABG.  4. Depression  History of Present Illness:  William Hernandez is a 70 year old gentleman with a history of coronary disease-status post coronary artery bypass grafting. He's done very well from a cardiac standpoint. He is participating in cardiac rehabilitation and has not had any episodes of chest pain or shortness breath.  He has done well. His lipids are well controlled.   He is participating in cardiac rehab and is doing well. 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 He doesn't have any episodes of chest pain or shortness breath when he is active. 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  He continues to have issues with anxiety.   July 14, 2012:  William Hernandez is doing well. He had a nose bleed several weeks ago. He is exercising regularly. No angina. No dyspnea.  Oct. 27, 2014:  He is doing well. 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 2 very hard games of 3 on 3 , half court game. No pain. Active. Exercises. Increased his Trazadone to help with some additional depression.   July 14 2013:  William Hernandez is doing well. He remains very active. Plays in the Senior games. No CP. Has has a URI for the past 10 days which has slowed him down a bit.   Oct. 26, 2015:  William Hernandez is doing well.  Bp is well controlled. Doing lots of fishing. Plays basketball on Wednesdays. No Cp .   Jul 21, 2014:  William Hernandez is a 70 y.o. male who presents for follow up of his CAD and hyperlipidemia Still exercising well.   Still playing basketball regularly.  No angina    01/17/2015: Doing well from a cardiac  standpoint. Has had a head cold - difficulty hearing in his left ear   June 19, 2015:  William Hernandez is seen for follow up visit .  Still playing basketball regularly . No  CP or dyspnea.     Aug 02, 2015:  William Hernandez is seen back for an episode CP that occurred in Cardiac rehab yesterday   Feels it at cardiac rehab Also when he first starts out playing basket ball After he gets warmed up, he feels fine.  Also occurs when he is mowing the grass.    myoview today shows no large area of ishcmeia. LV function is mildly depressed with EF 45%   Sept. 7, 2017:  William Hernandez is seen today . Had a cardiac cath in May, 2017: 1. Severe three-vessel native coronary artery disease 2. Status post CABG with patent RIMA to PDA, LIMA to LAD, sequential saphenous vein graft to OM1 and OM2, and saphenous vein graft to first diagonal 3. 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 No angina    Past Medical History:  Diagnosis Date  . Arrhythmia    afib  . Coronary artery disease    2003  . Depression   . Hyperlipidemia   . S/P hernia surgery     Past Surgical History:  Procedure Laterality Date  . CARDIAC CATHETERIZATION     11/2001  .  CARDIAC CATHETERIZATION N/A 08/04/2015   Procedure: Left Heart Cath and Cors/Grafts Angiography;  Surgeon: Tonny BollmanMichael Cooper, MD;  [facility];  Service: Cardiovascular;  Laterality: N/A;  . CHOLECYSTECTOMY    . CORONARY ARTERY BYPASS GRAFT  2000  . HERNIA REPAIR       Current Outpatient Prescriptions  Medication Sig Dispense Refill  . aspirin 81 MG tablet Take 81 mg by mouth at bedtime.     Marland Kitchen. atorvastatin (LIPITOR) 20 MG tablet TAKE 1 TABLET BY MOUTH ONCE DAILY 90 tablet 3  . BYSTOLIC 5 MG tablet TAKE 1 TABLET BY MOUTH DAILY. 30 tablet 10  . fish oil-omega-3 fatty acids 1000 MG capsule Take 1 g by mouth daily.     . Flaxseed, Linseed, (FLAX SEED OIL PO) Take 1 tablet by mouth daily.     . irbesartan  (AVAPRO) 150 MG tablet TAKE 1/2 TABLET BY MOUTH AT BEDTIME. 45 tablet 1  . Multiple Vitamin (MULTIVITAMIN PO) Take 1 tablet by mouth daily.     . nitroGLYCERIN (NITROSTAT) 0.4 MG SL tablet Place 1 tablet (0.4 mg total) under the tongue every 5 (five) minutes as needed for chest pain. 25 tablet 12  . ranitidine (ZANTAC) 150 MG capsule Take 150 mg by mouth every evening.      . sildenafil (REVATIO) 20 MG tablet as directed. Take 1-5 tablets by mouth daily as needed    . traZODone (DESYREL) 150 MG tablet Take 200 mg by mouth at bedtime as needed for sleep.   0   No current facility-administered medications for this visit.     Allergies:   Lisinopril and Penicillins    Social History:  The patient  reports that he has never smoked. He has never used smokeless tobacco. He reports that he does not drink alcohol or use drugs.   Family History:  The patient's family history includes Heart disease in his father.    ROS:  Please see the history of present illness.    Review of Systems: Constitutional:  denies fever, chills, diaphoresis, appetite change and fatigue.  HEENT: denies photophobia, eye pain, redness, hearing loss, ear pain, congestion, sore throat, rhinorrhea, sneezing, neck pain, neck stiffness and tinnitus.  Respiratory: denies SOB, DOE, cough, chest tightness, and wheezing.  Cardiovascular: denies chest pain, palpitations and leg swelling.  Gastrointestinal: denies nausea, vomiting, abdominal pain, diarrhea, constipation, blood in stool.  Genitourinary: denies dysuria, urgency, frequency, hematuria, flank pain and difficulty urinating.  Musculoskeletal: denies  myalgias, back pain, joint swelling, arthralgias and gait problem.   Skin: denies pallor, rash and wound.  Neurological: denies dizziness, seizures, syncope, weakness, light-headedness, numbness and headaches.   Hematological: denies adenopathy, easy bruising, personal or family bleeding history.  Psychiatric/ Behavioral:  denies suicidal ideation, mood changes, confusion, nervousness, sleep disturbance and agitation.       All other systems are reviewed and negative.    PHYSICAL EXAM: VS:  BP 120/78   Pulse (!) 55   Ht 5\' 8"  (1.727 m)   Wt 187 lb 9.6 oz (85.1 kg)   SpO2 98%   BMI 28.52 kg/m  , BMI Body mass index is 28.52 kg/m. GEN: Well nourished, well developed, in no acute distress  HEENT: normal  Neck: no JVD, carotid bruits, or masses Cardiac: RRR; no murmurs, rubs, or gallops,no edema  Respiratory:  clear to auscultation bilaterally, normal work of breathing GI: soft, nontender, nondistended, + BS MS: no deformity or atrophy  Skin: warm and dry, no rash  Neuro:  Strength and sensation are intact Psych: normal   EKG:  EKG is ordered today. The ekg ordered today demonstrates sinus brady at 52.  Inc. RBBB.    Recent Labs: 06/16/2015: ALT 20 08/02/2015: BUN 16; Creat 1.11; Hemoglobin 16.3; Platelets 207; Potassium 4.1; Sodium 137    Lipid Panel    Component Value Date/Time   CHOL 101 (L) 06/16/2015 0857   TRIG 65 06/16/2015 0857   HDL 46 06/16/2015 0857   CHOLHDL 2.2 06/16/2015 0857   VLDL 13 06/16/2015 0857   LDLCALC 42 06/16/2015 0857      Wt Readings from Last 3 Encounters:  11/23/15 187 lb 9.6 oz (85.1 kg)  08/04/15 189 lb (85.7 kg)  08/02/15 191 lb (86.6 kg)      Other studies Reviewed: Additional studies/ records that were reviewed today include: . Review of the above records demonstrates:    ASSESSMENT AND PLAN:  1. CAD ( CABG Oct. 6, 2003)  cath May 2017 -  1. Severe three-vessel native coronary artery disease 2. Status post CABG with patent RIMA to PDA, LIMA to LAD, sequential saphenous vein graft to OM1 and OM2, and saphenous vein graft to first diagonal 3. 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 Dyslipidemia- doing well. Continue same meds,  check labs today.  3. Atrial fibrillation - occurred  following CABG.  No recurrent atrial fibrillation at this point.  4. Depression - he sees Becky Kincaid,MS  5. HTN:  BP is well controlled.   Current medicines are reviewed at length with the patient today.  The patient does not have concerns regarding medicines.  The following changes have been made:  no change  Labs/ tests ordered today include:  No orders of the defined types were placed in this encounter.  Disposition:   FU with me in 6 months     Kristeen Miss, MD  11/23/2015 8:43 AM    [facility] [address] [place], [place], [place]  [address] Phone: [phone]; Fax: [phone]

## 2015-11-27 ENCOUNTER — Encounter: Payer: Self-pay | Admitting: Cardiovascular Disease

## 2015-11-28 ENCOUNTER — Encounter (HOSPITAL_COMMUNITY)
Admission: RE | Admit: 2015-11-28 | Discharge: 2015-11-28 | Disposition: A | Discharge status: Home / Self Care | Payer: 59 | Source: Ambulatory Visit | Attending: Cardiovascular Disease | Admitting: Cardiovascular Disease

## 2015-11-29 ENCOUNTER — Encounter (HOSPITAL_COMMUNITY): Payer: 59

## 2015-11-30 ENCOUNTER — Encounter (HOSPITAL_COMMUNITY): Payer: 59

## 2015-12-04 ENCOUNTER — Ambulatory Visit: Payer: Medicare Other | Admitting: Cardiovascular Disease

## 2015-12-05 ENCOUNTER — Encounter (HOSPITAL_COMMUNITY): Payer: 59

## 2015-12-06 ENCOUNTER — Encounter (HOSPITAL_COMMUNITY): Payer: 59

## 2015-12-07 ENCOUNTER — Encounter (HOSPITAL_COMMUNITY): Payer: 59

## 2015-12-07 ENCOUNTER — Encounter: Payer: Self-pay | Admitting: Cardiovascular Disease

## 2015-12-08 ENCOUNTER — Encounter (HOSPITAL_COMMUNITY)
Admission: RE | Admit: 2015-12-08 | Discharge: 2015-12-08 | Disposition: A | Discharge status: Home / Self Care | Payer: Self-pay | Source: Ambulatory Visit | Attending: Cardiovascular Disease | Admitting: Cardiovascular Disease

## 2015-12-12 ENCOUNTER — Encounter (HOSPITAL_COMMUNITY): Payer: 59

## 2015-12-13 ENCOUNTER — Encounter (HOSPITAL_COMMUNITY): Payer: 59

## 2015-12-14 ENCOUNTER — Encounter (HOSPITAL_COMMUNITY): Payer: 59

## 2015-12-19 ENCOUNTER — Encounter (HOSPITAL_COMMUNITY)
Admission: RE | Admit: 2015-12-19 | Discharge: 2015-12-19 | Disposition: A | Discharge status: Home / Self Care | Payer: Self-pay | Source: Ambulatory Visit | Attending: Cardiovascular Disease | Admitting: Cardiovascular Disease

## 2015-12-19 DIAGNOSIS — Z951 Presence of aortocoronary bypass graft: Secondary | ICD-10-CM | POA: Insufficient documentation

## 2015-12-26 ENCOUNTER — Encounter (HOSPITAL_COMMUNITY)
Admission: RE | Admit: 2015-12-26 | Discharge: 2015-12-26 | Disposition: A | Discharge status: Home / Self Care | Payer: Self-pay | Source: Ambulatory Visit | Attending: Cardiovascular Disease | Admitting: Cardiovascular Disease

## 2015-12-27 ENCOUNTER — Encounter (HOSPITAL_COMMUNITY): Payer: Self-pay

## 2015-12-28 ENCOUNTER — Encounter (HOSPITAL_COMMUNITY)
Admission: RE | Admit: 2015-12-28 | Discharge: 2015-12-28 | Disposition: A | Discharge status: Home / Self Care | Payer: Self-pay | Source: Ambulatory Visit | Attending: Cardiovascular Disease | Admitting: Cardiovascular Disease

## 2016-01-02 ENCOUNTER — Encounter (HOSPITAL_COMMUNITY): Payer: Self-pay

## 2016-01-03 ENCOUNTER — Encounter (HOSPITAL_COMMUNITY): Payer: Self-pay

## 2016-01-04 ENCOUNTER — Encounter (HOSPITAL_COMMUNITY): Payer: Self-pay

## 2016-01-09 ENCOUNTER — Encounter (HOSPITAL_COMMUNITY): Payer: Self-pay

## 2016-01-10 ENCOUNTER — Encounter (HOSPITAL_COMMUNITY): Payer: Self-pay

## 2016-01-11 ENCOUNTER — Encounter (HOSPITAL_COMMUNITY)
Admission: RE | Admit: 2016-01-11 | Discharge: 2016-01-11 | Disposition: A | Discharge status: Home / Self Care | Payer: Self-pay | Source: Ambulatory Visit | Attending: Cardiovascular Disease | Admitting: Cardiovascular Disease

## 2016-01-16 ENCOUNTER — Encounter (HOSPITAL_COMMUNITY): Payer: Self-pay

## 2016-01-17 ENCOUNTER — Encounter (HOSPITAL_COMMUNITY): Payer: Self-pay

## 2016-01-17 DIAGNOSIS — Z951 Presence of aortocoronary bypass graft: Secondary | ICD-10-CM | POA: Insufficient documentation

## 2016-01-18 ENCOUNTER — Encounter (HOSPITAL_COMMUNITY)
Admission: RE | Admit: 2016-01-18 | Discharge: 2016-01-18 | Disposition: A | Discharge status: Home / Self Care | Payer: Self-pay | Source: Ambulatory Visit | Attending: Cardiovascular Disease | Admitting: Cardiovascular Disease

## 2016-01-23 ENCOUNTER — Encounter (HOSPITAL_COMMUNITY)
Admission: RE | Admit: 2016-01-23 | Discharge: 2016-01-23 | Disposition: A | Discharge status: Home / Self Care | Payer: Self-pay | Source: Ambulatory Visit | Attending: Cardiovascular Disease | Admitting: Cardiovascular Disease

## 2016-01-24 ENCOUNTER — Encounter (HOSPITAL_COMMUNITY): Payer: Self-pay

## 2016-01-25 ENCOUNTER — Encounter (HOSPITAL_COMMUNITY): Payer: Self-pay

## 2016-01-30 ENCOUNTER — Encounter (HOSPITAL_COMMUNITY): Payer: Self-pay

## 2016-01-31 ENCOUNTER — Encounter (HOSPITAL_COMMUNITY): Payer: Self-pay

## 2016-02-01 ENCOUNTER — Encounter (HOSPITAL_COMMUNITY): Payer: Self-pay

## 2016-02-06 ENCOUNTER — Encounter (HOSPITAL_COMMUNITY)
Admission: RE | Admit: 2016-02-06 | Discharge: 2016-02-06 | Disposition: A | Discharge status: Home / Self Care | Payer: Self-pay | Source: Ambulatory Visit | Attending: Cardiovascular Disease | Admitting: Cardiovascular Disease

## 2016-02-07 ENCOUNTER — Encounter (HOSPITAL_COMMUNITY): Payer: Self-pay

## 2016-02-13 ENCOUNTER — Encounter (HOSPITAL_COMMUNITY)
Admission: RE | Admit: 2016-02-13 | Discharge: 2016-02-13 | Disposition: A | Discharge status: Home / Self Care | Payer: Self-pay | Source: Ambulatory Visit | Attending: Cardiovascular Disease | Admitting: Cardiovascular Disease

## 2016-02-14 ENCOUNTER — Encounter (HOSPITAL_COMMUNITY): Payer: Self-pay

## 2016-02-15 ENCOUNTER — Encounter (HOSPITAL_COMMUNITY): Payer: Self-pay

## 2016-02-20 ENCOUNTER — Encounter (HOSPITAL_COMMUNITY)
Admission: RE | Admit: 2016-02-20 | Discharge: 2016-02-20 | Disposition: A | Discharge status: Home / Self Care | Payer: Self-pay | Source: Ambulatory Visit | Attending: Cardiovascular Disease | Admitting: Cardiovascular Disease

## 2016-02-20 DIAGNOSIS — Z951 Presence of aortocoronary bypass graft: Secondary | ICD-10-CM | POA: Insufficient documentation

## 2016-02-21 ENCOUNTER — Encounter (HOSPITAL_COMMUNITY): Payer: Self-pay

## 2016-02-22 ENCOUNTER — Encounter (HOSPITAL_COMMUNITY)
Admission: RE | Admit: 2016-02-22 | Discharge: 2016-02-22 | Disposition: A | Discharge status: Home / Self Care | Payer: Self-pay | Source: Ambulatory Visit | Attending: Cardiovascular Disease | Admitting: Cardiovascular Disease

## 2016-02-27 ENCOUNTER — Encounter (HOSPITAL_COMMUNITY): Payer: Self-pay

## 2016-02-28 ENCOUNTER — Encounter (HOSPITAL_COMMUNITY): Payer: Self-pay

## 2016-02-29 ENCOUNTER — Encounter (HOSPITAL_COMMUNITY): Payer: Self-pay

## 2016-03-05 ENCOUNTER — Encounter (HOSPITAL_COMMUNITY): Payer: Self-pay

## 2016-03-06 ENCOUNTER — Encounter (HOSPITAL_COMMUNITY): Payer: Self-pay

## 2016-03-07 ENCOUNTER — Encounter (HOSPITAL_COMMUNITY): Payer: Self-pay

## 2016-03-12 ENCOUNTER — Encounter (HOSPITAL_COMMUNITY): Payer: Self-pay

## 2016-03-13 ENCOUNTER — Encounter (HOSPITAL_COMMUNITY): Payer: Self-pay

## 2016-03-14 ENCOUNTER — Encounter (HOSPITAL_COMMUNITY): Payer: Self-pay

## 2016-03-19 ENCOUNTER — Encounter (HOSPITAL_COMMUNITY)
Admission: RE | Admit: 2016-03-19 | Discharge: 2016-03-19 | Disposition: A | Discharge status: Home / Self Care | Payer: Self-pay | Source: Ambulatory Visit | Attending: Cardiovascular Disease | Admitting: Cardiovascular Disease

## 2016-03-19 DIAGNOSIS — Z951 Presence of aortocoronary bypass graft: Secondary | ICD-10-CM | POA: Insufficient documentation

## 2016-03-20 ENCOUNTER — Encounter (HOSPITAL_COMMUNITY): Payer: Self-pay

## 2016-03-21 ENCOUNTER — Encounter (HOSPITAL_COMMUNITY): Payer: Self-pay

## 2016-03-22 ENCOUNTER — Other Ambulatory Visit: Payer: Self-pay | Admitting: Cardiovascular Disease

## 2016-03-26 ENCOUNTER — Encounter (HOSPITAL_COMMUNITY)
Admission: RE | Admit: 2016-03-26 | Discharge: 2016-03-26 | Disposition: A | Discharge status: Home / Self Care | Payer: Self-pay | Source: Ambulatory Visit | Attending: Cardiovascular Disease | Admitting: Cardiovascular Disease

## 2016-03-27 ENCOUNTER — Encounter (HOSPITAL_COMMUNITY): Payer: Self-pay

## 2016-03-28 ENCOUNTER — Encounter (HOSPITAL_COMMUNITY): Payer: Self-pay

## 2016-04-02 ENCOUNTER — Encounter (HOSPITAL_COMMUNITY): Payer: Self-pay

## 2016-04-03 ENCOUNTER — Encounter (HOSPITAL_COMMUNITY): Payer: Self-pay

## 2016-04-04 ENCOUNTER — Encounter (HOSPITAL_COMMUNITY): Payer: Self-pay

## 2016-04-09 ENCOUNTER — Encounter (HOSPITAL_COMMUNITY)
Admission: RE | Admit: 2016-04-09 | Discharge: 2016-04-09 | Disposition: A | Discharge status: Home / Self Care | Payer: Self-pay | Source: Ambulatory Visit | Attending: Cardiovascular Disease | Admitting: Cardiovascular Disease

## 2016-04-10 ENCOUNTER — Encounter (HOSPITAL_COMMUNITY): Payer: Self-pay

## 2016-04-11 ENCOUNTER — Encounter (HOSPITAL_COMMUNITY)
Admission: RE | Admit: 2016-04-11 | Discharge: 2016-04-11 | Disposition: A | Discharge status: Home / Self Care | Payer: Self-pay | Source: Ambulatory Visit | Attending: Cardiovascular Disease | Admitting: Cardiovascular Disease

## 2016-04-16 ENCOUNTER — Encounter (HOSPITAL_COMMUNITY)
Admission: RE | Admit: 2016-04-16 | Discharge: 2016-04-16 | Disposition: A | Discharge status: Home / Self Care | Payer: Self-pay | Source: Ambulatory Visit | Attending: Cardiovascular Disease | Admitting: Cardiovascular Disease

## 2016-04-17 ENCOUNTER — Encounter (HOSPITAL_COMMUNITY): Payer: Self-pay

## 2016-04-18 ENCOUNTER — Encounter (HOSPITAL_COMMUNITY): Payer: Self-pay

## 2016-04-18 DIAGNOSIS — Z951 Presence of aortocoronary bypass graft: Secondary | ICD-10-CM | POA: Insufficient documentation

## 2016-04-23 ENCOUNTER — Encounter (HOSPITAL_COMMUNITY)
Admission: RE | Admit: 2016-04-23 | Discharge: 2016-04-23 | Disposition: A | Discharge status: Home / Self Care | Payer: Self-pay | Source: Ambulatory Visit | Attending: Cardiovascular Disease | Admitting: Cardiovascular Disease

## 2016-04-24 ENCOUNTER — Encounter (HOSPITAL_COMMUNITY): Payer: Self-pay

## 2016-04-25 ENCOUNTER — Encounter (HOSPITAL_COMMUNITY): Payer: Self-pay

## 2016-04-30 ENCOUNTER — Encounter (HOSPITAL_COMMUNITY)
Admission: RE | Admit: 2016-04-30 | Discharge: 2016-04-30 | Disposition: A | Discharge status: Home / Self Care | Payer: Self-pay | Source: Ambulatory Visit | Attending: Cardiovascular Disease | Admitting: Cardiovascular Disease

## 2016-05-01 ENCOUNTER — Encounter (HOSPITAL_COMMUNITY): Payer: Self-pay

## 2016-05-02 ENCOUNTER — Encounter (HOSPITAL_COMMUNITY): Payer: Self-pay

## 2016-05-07 ENCOUNTER — Encounter (HOSPITAL_COMMUNITY): Payer: Self-pay

## 2016-05-08 ENCOUNTER — Encounter (HOSPITAL_COMMUNITY): Payer: Self-pay

## 2016-05-09 ENCOUNTER — Encounter (HOSPITAL_COMMUNITY): Payer: Self-pay

## 2016-05-14 ENCOUNTER — Encounter (HOSPITAL_COMMUNITY)
Admission: RE | Admit: 2016-05-14 | Discharge: 2016-05-14 | Disposition: A | Discharge status: Home / Self Care | Payer: Self-pay | Source: Ambulatory Visit | Attending: Cardiovascular Disease | Admitting: Cardiovascular Disease

## 2016-05-15 ENCOUNTER — Encounter (HOSPITAL_COMMUNITY): Payer: Self-pay

## 2016-05-16 ENCOUNTER — Encounter (HOSPITAL_COMMUNITY): Payer: Self-pay

## 2016-05-16 DIAGNOSIS — Z951 Presence of aortocoronary bypass graft: Secondary | ICD-10-CM | POA: Insufficient documentation

## 2016-05-21 ENCOUNTER — Encounter (HOSPITAL_COMMUNITY)
Admission: RE | Admit: 2016-05-21 | Discharge: 2016-05-21 | Disposition: A | Discharge status: Home / Self Care | Payer: Self-pay | Source: Ambulatory Visit | Attending: Cardiovascular Disease | Admitting: Cardiovascular Disease

## 2016-05-22 ENCOUNTER — Encounter (HOSPITAL_COMMUNITY): Payer: Self-pay

## 2016-05-23 ENCOUNTER — Encounter (HOSPITAL_COMMUNITY): Payer: Self-pay

## 2016-05-28 ENCOUNTER — Encounter (HOSPITAL_COMMUNITY): Payer: Self-pay

## 2016-05-28 ENCOUNTER — Encounter: Payer: Self-pay | Admitting: Cardiovascular Disease

## 2016-05-29 ENCOUNTER — Encounter (HOSPITAL_COMMUNITY): Payer: Self-pay

## 2016-05-30 ENCOUNTER — Encounter (HOSPITAL_COMMUNITY)
Admission: RE | Admit: 2016-05-30 | Discharge: 2016-05-30 | Disposition: A | Discharge status: Home / Self Care | Payer: Self-pay | Source: Ambulatory Visit | Attending: Cardiovascular Disease | Admitting: Cardiovascular Disease

## 2016-06-03 ENCOUNTER — Encounter: Payer: Self-pay | Admitting: Cardiovascular Disease

## 2016-06-03 ENCOUNTER — Ambulatory Visit (INDEPENDENT_AMBULATORY_CARE_PROVIDER_SITE_OTHER): Payer: Medicare Other | Admitting: Cardiovascular Disease

## 2016-06-03 VITALS — BP 124/75 | HR 51 | Ht 68.0 in | Wt 185.8 lb

## 2016-06-03 DIAGNOSIS — R972 Elevated prostate specific antigen [PSA]: Secondary | ICD-10-CM

## 2016-06-03 DIAGNOSIS — I10 Essential (primary) hypertension: Secondary | ICD-10-CM

## 2016-06-03 DIAGNOSIS — E782 Mixed hyperlipidemia: Secondary | ICD-10-CM

## 2016-06-03 DIAGNOSIS — I2510 Atherosclerotic heart disease of native coronary artery without angina pectoris: Secondary | ICD-10-CM

## 2016-06-03 DIAGNOSIS — R739 Hyperglycemia, unspecified: Secondary | ICD-10-CM | POA: Diagnosis not present

## 2016-06-03 NOTE — Patient Instructions (Signed)
Medication Instructions:  Your physician recommends that you continue on your current medications as directed. Please refer to the Current Medication list given to you today.   Labwork: TODAY - cholesterol, complete metabolic panel, PSA, A1C   Testing/Procedures: None Ordered   Follow-Up: Your physician wants you to follow-up in: 6 months with Dr. Elease HashimotoNahser.  You will receive a reminder letter in the mail two months in advance. If you don't receive a letter, please call our office to schedule the follow-up appointment.   If you need a refill on your cardiac medications before your next appointment, please call your pharmacy.   Thank you for choosing [facility]! Eligha BridegroomMichelle Swinyer, RN [phone]

## 2016-06-03 NOTE — Progress Notes (Signed)
Cardiology Office Note   Date:  06/03/2016   ID:  William Hernandez, DOB 01-Apr-1945, MRN [id number]  PCP:  Kaleen Mask, MD  Cardiologist:   Kristeen Miss, MD   Chief Complaint  Hernandez presents with  . Coronary Artery Disease   1. CAD ( CABG Oct. 6, 2003)  2. Dyslipidemia 3. Atrial fibrillation - post op from CABG.  4. Depression  History of Present Illness:  William Hernandez is a 71 year old gentleman with a history of coronary disease-status post coronary artery bypass grafting. He's done very well from a cardiac standpoint. He is participating in cardiac rehabilitation and has not had any episodes of chest pain or shortness breath.  He has done well. His lipids are well controlled.   He is participating in cardiac rehab and is doing well. 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 He doesn't have any episodes of chest pain or shortness breath when he is active. He also competed in [place] [employer] senior Olympics basketball shooting contest.  He continues to have issues with anxiety.   July 14, 2012:  William Hernandez is doing well. He had a nose bleed several weeks ago. He is exercising regularly. No angina. No dyspnea.  Oct. 27, 2014:  He is doing well. He was in [place] senior olympics basket ball game this past week. 2 very hard games of 3 on 3 , half court game. No pain. Active. Exercises. Increased his Trazadone to help with some additional depression.   July 14 2013:  William Hernandez is doing well. He remains very active. Plays in [place] Senior games. No CP. Has has a URI for William past 10 days which has slowed him down a bit.   Oct. 26, 2015:  William Hernandez is doing well.  Bp is well controlled. Doing lots of fishing. Plays basketball on Wednesdays. No Cp .   Jul 21, 2014:  William Hernandez is a 71 y.o. male who presents for follow up of his CAD and hyperlipidemia Still exercising well.   Still playing basketball regularly.  No angina     01/17/2015: Doing well from a cardiac standpoint. Has had a head cold - difficulty hearing in his left ear   June 19, 2015:  William Hernandez is seen for follow up visit .  Still playing basketball regularly . No  CP or dyspnea.     Aug 02, 2015:  William Hernandez is seen back for an episode CP that occurred in Cardiac rehab yesterday   Feels it at cardiac rehab Also when he first starts out playing basket ball After he gets warmed up, he feels fine.  Also occurs when he is mowing William grass.    myoview today shows no large area of ishcmeia. LV function is mildly depressed with EF 45%   Sept. 7, 2017:  William Hernandez is seen today . Had a cardiac cath in May, 2017: 1. Severe three-vessel native coronary artery disease 2. Status post CABG with patent RIMA to PDA, LIMA to LAD, sequential saphenous vein graft to OM1 and OM2, and saphenous vein graft to first diagonal 3. 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 No angina   June 03, 2016:  Still playing basketball , no angina  Still fly fishing    Past Medical History:  Diagnosis Date  . Arrhythmia    afib  . Coronary artery disease    2003  . Depression   . Hyperlipidemia   .  S/P hernia surgery     Past Surgical History:  Procedure Laterality Date  . CARDIAC CATHETERIZATION     11/2001  . CARDIAC CATHETERIZATION N/A 08/04/2015   Procedure: Left Heart Cath and Cors/Grafts Angiography;  Surgeon: Tonny Bollman, MD;  Location: [facility] INVASIVE CV LAB;  Service: Cardiovascular;  Laterality: N/A;  . CHOLECYSTECTOMY    . CORONARY ARTERY BYPASS GRAFT  2000  . HERNIA REPAIR       Current Outpatient Prescriptions  Medication Sig Dispense Refill  . aspirin 81 MG tablet Take 81 mg by mouth at bedtime.     Marland Kitchen atorvastatin (LIPITOR) 20 MG tablet TAKE 1 TABLET BY MOUTH ONCE DAILY 90 tablet 3  . BYSTOLIC 5 MG tablet TAKE 1 TABLET BY MOUTH DAILY. 30 tablet 10  . fish oil-omega-3 fatty acids 1000 MG  capsule Take 1 g by mouth daily.     . Flaxseed, Linseed, (FLAX SEED OIL PO) Take 1 tablet by mouth daily.     . irbesartan (AVAPRO) 150 MG tablet TAKE 1/2 TABLET BY MOUTH AT BEDTIME. 45 tablet 2  . Multiple Vitamin (MULTIVITAMIN PO) Take 1 tablet by mouth daily.     . nitroGLYCERIN (NITROSTAT) 0.4 MG SL tablet Place 1 tablet (0.4 mg total) under William tongue every 5 (five) minutes as needed for chest pain. 25 tablet 3  . Probiotic Product (PROBIOTIC-10 PO) Take 1 tablet by mouth daily.    . ranitidine (ZANTAC) 150 MG capsule Take 150 mg by mouth every evening.      . sildenafil (REVATIO) 20 MG tablet as directed. Take 1-5 tablets by mouth daily as needed    . traZODone (DESYREL) 150 MG tablet Take 200 mg by mouth at bedtime as needed for sleep.   0   No current facility-administered medications for this visit.     Allergies:   Lisinopril and Penicillins    Social History:  William Hernandez  reports that he has never smoked. He has never used smokeless tobacco. He reports that he does not drink alcohol or use drugs.   Family History:  William Hernandez's family history includes Heart disease in his father.    ROS:  Please see William history of present illness.    Review of Systems: Constitutional:  denies fever, chills, diaphoresis, appetite change and fatigue.  HEENT: denies photophobia, eye pain, redness, hearing loss, ear pain, congestion, sore throat, rhinorrhea, sneezing, neck pain, neck stiffness and tinnitus.  Respiratory: denies SOB, DOE, cough, chest tightness, and wheezing.  Cardiovascular: denies chest pain, palpitations and leg swelling.  Gastrointestinal: denies nausea, vomiting, abdominal pain, diarrhea, constipation, blood in stool.  Genitourinary: denies dysuria, urgency, frequency, hematuria, flank pain and difficulty urinating.  Musculoskeletal: denies  myalgias, back pain, joint swelling, arthralgias and gait problem.   Skin: denies pallor, rash and wound.  Neurological: denies  dizziness, seizures, syncope, weakness, light-headedness, numbness and headaches.   Hematological: denies adenopathy, easy bruising, personal or family bleeding history.  Psychiatric/ Behavioral: denies suicidal ideation, mood changes, confusion, nervousness, sleep disturbance and agitation.       All other systems are reviewed and negative.    PHYSICAL EXAM: VS:  BP 124/75 (BP Location: Right Arm, Cuff Size: Large)   Pulse (!) 51   Ht 5\' 8"  (1.727 m)   Wt 185 lb 12.8 oz (84.3 kg)   SpO2 98%   BMI 28.25 kg/m  , BMI Body mass index is 28.25 kg/m. GEN: Well nourished, well developed, in no acute distress  HEENT:  normal  Neck: no JVD, carotid bruits, or masses Cardiac: RRR; no murmurs, rubs, or gallops,no edema  Respiratory:  clear to auscultation bilaterally, normal work of breathing GI: soft, nontender, nondistended, + BS MS: no deformity or atrophy  Skin: warm and dry, no rash Neuro:  Strength and sensation are intact Psych: normal   EKG:  EKG is ordered today. William ekg ordered today demonstrates sinus brady at 51.   Otherwise normal   Recent Labs: 08/02/2015: Hemoglobin 16.3; Platelets 207 11/23/2015: ALT 18; BUN 15; Creat 0.93; Potassium 4.3; Sodium 135    Lipid Panel    Component Value Date/Time   CHOL 115 (L) 11/23/2015 0858   TRIG 72 11/23/2015 0858   HDL 52 11/23/2015 0858   CHOLHDL 2.2 11/23/2015 0858   VLDL 14 11/23/2015 0858   LDLCALC 49 11/23/2015 0858      Wt Readings from Last 3 Encounters:  06/03/16 185 lb 12.8 oz (84.3 kg)  11/23/15 187 lb 9.6 oz (85.1 kg)  08/04/15 189 lb (85.7 kg)      Other studies Reviewed: Additional studies/ records that were reviewed today include: . Review of William above records demonstrates:    ASSESSMENT AND PLAN:  1. CAD ( CABG Oct. 6, 2003)  cath May 2017 -  1. Severe three-vessel native coronary artery disease 2. Status post CABG with patent RIMA to PDA, LIMA to LAD, sequential saphenous vein graft to OM1 and  OM2, and saphenous vein graft to first diagonal 3. 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 Dyslipidemia- doing well. Continue same meds,  check labs today.  3. Atrial fibrillation - occurred following CABG.  No recurrent atrial fibrillation at this point.  4. Depression - he sees Becky Kincaid,MS  5. HTN:  BP is well controlled.   Current medicines are reviewed at length with William Hernandez today.  William Hernandez does not have concerns regarding medicines.  William following changes have been made:  no change  Labs/ tests ordered today include:  No orders of William defined types were placed in this encounter.  Disposition:   FU with me in 6 months     Kristeen MissPhilip Nahser, MD  06/03/2016 8:33 AM    [facility] [address], [place], [place]  [address] Phone: [phone]; Fax: [phone]

## 2016-06-04 ENCOUNTER — Encounter (HOSPITAL_COMMUNITY)
Admission: RE | Admit: 2016-06-04 | Discharge: 2016-06-04 | Disposition: A | Discharge status: Home / Self Care | Payer: Self-pay | Source: Ambulatory Visit | Attending: Cardiovascular Disease | Admitting: Cardiovascular Disease

## 2016-06-04 LAB — LIPID PANEL
CHOLESTEROL TOTAL: 107 mg/dL (ref 100–199)
Chol/HDL Ratio: 2.2 ratio units (ref 0.0–5.0)
HDL: 49 mg/dL (ref >39)
LDL Calculated: 45 mg/dL (ref 0–99)
Triglycerides: 63 mg/dL (ref 0–149)
VLDL Cholesterol Cal: 13 mg/dL (ref 5–40)

## 2016-06-04 LAB — COMPREHENSIVE METABOLIC PANEL
A/G RATIO: 2 (ref 1.2–2.2)
ALT: 19 IU/L (ref 0–44)
AST: 15 IU/L (ref 0–40)
Albumin: 4.4 g/dL (ref 3.5–4.8)
Alkaline Phosphatase: 54 IU/L (ref 39–117)
BUN/Creatinine Ratio: 11 (ref 10–24)
BUN: 10 mg/dL (ref 8–27)
Bilirubin Total: 1.6 mg/dL — ABNORMAL HIGH (ref 0.0–1.2)
CALCIUM: 9.5 mg/dL (ref 8.6–10.2)
CO2: 23 mmol/L (ref 18–29)
CREATININE: 0.89 mg/dL (ref 0.76–1.27)
Chloride: 100 mmol/L (ref 96–106)
GFR calc Af Amer: 99 mL/min/{1.73_m2} (ref >59)
GFR calc non Af Amer: 85 mL/min/{1.73_m2} (ref >59)
GLOBULIN, TOTAL: 2.2 g/dL (ref 1.5–4.5)
Glucose: 110 mg/dL — ABNORMAL HIGH (ref 65–99)
POTASSIUM: 4.6 mmol/L (ref 3.5–5.2)
SODIUM: 140 mmol/L (ref 134–144)
Total Protein: 6.6 g/dL (ref 6.0–8.5)

## 2016-06-04 LAB — PSA: PROSTATE SPECIFIC AG, SERUM: 2.1 ng/mL (ref 0.0–4.0)

## 2016-06-05 ENCOUNTER — Encounter (HOSPITAL_COMMUNITY): Payer: Self-pay

## 2016-06-05 NOTE — Addendum Note (Signed)
Addended by: Levi AlandSWINYER, Thanya Cegielski M on: 06/05/2016 01:21 PM   Modules accepted: Orders

## 2016-06-06 ENCOUNTER — Encounter (HOSPITAL_COMMUNITY): Payer: Self-pay

## 2016-06-11 ENCOUNTER — Encounter (HOSPITAL_COMMUNITY)
Admission: RE | Admit: 2016-06-11 | Discharge: 2016-06-11 | Disposition: A | Discharge status: Home / Self Care | Payer: Self-pay | Source: Ambulatory Visit | Attending: Cardiovascular Disease | Admitting: Cardiovascular Disease

## 2016-06-12 ENCOUNTER — Encounter (HOSPITAL_COMMUNITY): Payer: Self-pay

## 2016-06-13 ENCOUNTER — Encounter (HOSPITAL_COMMUNITY): Payer: Self-pay

## 2016-06-18 ENCOUNTER — Encounter (HOSPITAL_COMMUNITY): Payer: Self-pay

## 2016-06-18 DIAGNOSIS — Z951 Presence of aortocoronary bypass graft: Secondary | ICD-10-CM | POA: Insufficient documentation

## 2016-06-19 ENCOUNTER — Encounter (HOSPITAL_COMMUNITY): Payer: Self-pay

## 2016-06-20 ENCOUNTER — Encounter (HOSPITAL_COMMUNITY): Payer: Self-pay

## 2016-06-25 ENCOUNTER — Encounter (HOSPITAL_COMMUNITY)
Admission: RE | Admit: 2016-06-25 | Discharge: 2016-06-25 | Disposition: A | Discharge status: Home / Self Care | Payer: Self-pay | Source: Ambulatory Visit | Attending: Cardiovascular Disease | Admitting: Cardiovascular Disease

## 2016-06-26 ENCOUNTER — Encounter (HOSPITAL_COMMUNITY): Payer: Self-pay

## 2016-06-27 ENCOUNTER — Encounter (HOSPITAL_COMMUNITY): Payer: Self-pay

## 2016-07-02 ENCOUNTER — Encounter (HOSPITAL_COMMUNITY)
Admission: RE | Admit: 2016-07-02 | Discharge: 2016-07-02 | Disposition: A | Discharge status: Home / Self Care | Payer: Self-pay | Source: Ambulatory Visit | Attending: Cardiovascular Disease | Admitting: Cardiovascular Disease

## 2016-07-03 ENCOUNTER — Encounter (HOSPITAL_COMMUNITY): Payer: Self-pay

## 2016-07-04 ENCOUNTER — Encounter (HOSPITAL_COMMUNITY): Payer: Self-pay

## 2016-07-09 ENCOUNTER — Encounter (HOSPITAL_COMMUNITY)
Admission: RE | Admit: 2016-07-09 | Discharge: 2016-07-09 | Disposition: A | Discharge status: Home / Self Care | Payer: Self-pay | Source: Ambulatory Visit | Attending: Cardiovascular Disease | Admitting: Cardiovascular Disease

## 2016-07-10 ENCOUNTER — Encounter (HOSPITAL_COMMUNITY): Payer: Self-pay

## 2016-07-11 ENCOUNTER — Encounter (HOSPITAL_COMMUNITY): Payer: Self-pay

## 2016-07-16 ENCOUNTER — Encounter (HOSPITAL_COMMUNITY): Payer: Self-pay

## 2016-07-16 DIAGNOSIS — I2510 Atherosclerotic heart disease of native coronary artery without angina pectoris: Secondary | ICD-10-CM | POA: Insufficient documentation

## 2016-07-18 ENCOUNTER — Encounter (HOSPITAL_COMMUNITY): Payer: Self-pay

## 2016-07-23 ENCOUNTER — Encounter (HOSPITAL_COMMUNITY)
Admission: RE | Admit: 2016-07-23 | Discharge: 2016-07-23 | Disposition: A | Discharge status: Home / Self Care | Payer: Self-pay | Source: Ambulatory Visit | Attending: Cardiovascular Disease | Admitting: Cardiovascular Disease

## 2016-07-24 ENCOUNTER — Encounter (HOSPITAL_COMMUNITY): Payer: Self-pay

## 2016-07-25 ENCOUNTER — Encounter (HOSPITAL_COMMUNITY): Payer: Self-pay

## 2016-07-30 ENCOUNTER — Encounter (HOSPITAL_COMMUNITY): Payer: Self-pay

## 2016-07-31 ENCOUNTER — Encounter (HOSPITAL_COMMUNITY): Payer: Self-pay

## 2016-08-01 ENCOUNTER — Encounter (HOSPITAL_COMMUNITY)
Admission: RE | Admit: 2016-08-01 | Discharge: 2016-08-01 | Disposition: A | Discharge status: Home / Self Care | Payer: Self-pay | Source: Ambulatory Visit | Attending: Cardiovascular Disease | Admitting: Cardiovascular Disease

## 2016-08-06 ENCOUNTER — Encounter (HOSPITAL_COMMUNITY)
Admission: RE | Admit: 2016-08-06 | Discharge: 2016-08-06 | Disposition: A | Discharge status: Home / Self Care | Payer: Self-pay | Source: Ambulatory Visit | Attending: Cardiovascular Disease | Admitting: Cardiovascular Disease

## 2016-08-07 ENCOUNTER — Encounter (HOSPITAL_COMMUNITY): Payer: Self-pay

## 2016-08-08 ENCOUNTER — Encounter (HOSPITAL_COMMUNITY): Payer: Self-pay

## 2016-08-13 ENCOUNTER — Encounter (HOSPITAL_COMMUNITY)
Admission: RE | Admit: 2016-08-13 | Discharge: 2016-08-13 | Disposition: A | Discharge status: Home / Self Care | Payer: Self-pay | Source: Ambulatory Visit | Attending: Cardiovascular Disease | Admitting: Cardiovascular Disease

## 2016-08-14 ENCOUNTER — Encounter (HOSPITAL_COMMUNITY): Payer: Self-pay

## 2016-08-15 ENCOUNTER — Encounter: Payer: Self-pay | Admitting: Cardiovascular Disease

## 2016-08-15 ENCOUNTER — Encounter (HOSPITAL_COMMUNITY): Payer: Self-pay

## 2016-08-16 ENCOUNTER — Telehealth: Payer: Self-pay | Admitting: Cardiovascular Disease

## 2016-08-16 NOTE — Telephone Encounter (Signed)
Agree with note by Michelle Swinyer, RN  

## 2016-08-16 NOTE — Telephone Encounter (Signed)
Spoke with patient regarding MyChart message that was received this morning about elevated HR. He reports that HR monitor on exercise bike showed error reading last night. He states soon after, he used his wife's BP machine and received pulse reading of 72 bpm. He took a bystolic 5 mg tablet and has felt fine since that time. He states he did not have any other symptoms during the time on the exercise bike. He reports BP today is 158/80 mmHg, HR 65 bpm. He states he did feel like his HR was irregular at times last night. He states he has discussed this with Dr. Elease HashimotoNahser in the past. He attends cardiac rehab and has not had any episodes of arrhythmias. He states HR does not vary much with exercise. He states HR usually 68-70 bpm; states sometimes it is higher 70's bpm at bedtime. He reports he has participated in his usual activities this week - mowing and playing basketball and he has not had any symptoms of chest discomfort, SOB, dizziness, or light-headedness. He is not in town today and cannot come in for an ekg. I advised him to drink something with additional electrolytes such as gatorade or V8 to see if this helps with possible PACs/PVCs if that is what he is having. I advised him to continue to monitor HR and if he has any concerns over the weekend to call the on-call provider.  I advised I will return to the office on Monday and to call back if he feels that he needs an ekg or an appointment with Dr. Elease HashimotoNahser. He verbalized understanding and agreement and thanked me for the call.

## 2016-08-16 NOTE — Telephone Encounter (Signed)
New message ° ° ° ° °Returning a call to the nurse °

## 2016-08-20 ENCOUNTER — Encounter (HOSPITAL_COMMUNITY)
Admission: RE | Admit: 2016-08-20 | Discharge: 2016-08-20 | Disposition: A | Discharge status: Home / Self Care | Payer: Self-pay | Source: Ambulatory Visit | Attending: Cardiovascular Disease | Admitting: Cardiovascular Disease

## 2016-08-20 DIAGNOSIS — I2510 Atherosclerotic heart disease of native coronary artery without angina pectoris: Secondary | ICD-10-CM | POA: Insufficient documentation

## 2016-08-21 ENCOUNTER — Encounter (HOSPITAL_COMMUNITY): Payer: Self-pay

## 2016-08-22 ENCOUNTER — Encounter (HOSPITAL_COMMUNITY): Payer: Self-pay

## 2016-08-27 ENCOUNTER — Encounter (HOSPITAL_COMMUNITY): Payer: Self-pay

## 2016-08-28 ENCOUNTER — Encounter (HOSPITAL_COMMUNITY): Payer: Self-pay

## 2016-08-29 ENCOUNTER — Encounter (HOSPITAL_COMMUNITY)
Admission: RE | Admit: 2016-08-29 | Discharge: 2016-08-29 | Disposition: A | Discharge status: Home / Self Care | Payer: Self-pay | Source: Ambulatory Visit | Attending: Cardiovascular Disease | Admitting: Cardiovascular Disease

## 2016-09-03 ENCOUNTER — Encounter (HOSPITAL_COMMUNITY)
Admission: RE | Admit: 2016-09-03 | Discharge: 2016-09-03 | Disposition: A | Discharge status: Home / Self Care | Payer: Self-pay | Source: Ambulatory Visit | Attending: Cardiovascular Disease | Admitting: Cardiovascular Disease

## 2016-09-04 ENCOUNTER — Encounter (HOSPITAL_COMMUNITY): Payer: Self-pay

## 2016-09-05 ENCOUNTER — Encounter (HOSPITAL_COMMUNITY): Payer: Self-pay

## 2016-09-10 ENCOUNTER — Encounter (HOSPITAL_COMMUNITY)
Admission: RE | Admit: 2016-09-10 | Discharge: 2016-09-10 | Disposition: A | Discharge status: Home / Self Care | Payer: Self-pay | Source: Ambulatory Visit | Attending: Cardiovascular Disease | Admitting: Cardiovascular Disease

## 2016-09-11 ENCOUNTER — Encounter (HOSPITAL_COMMUNITY): Payer: Self-pay

## 2016-09-11 ENCOUNTER — Other Ambulatory Visit: Payer: Self-pay | Admitting: Cardiovascular Disease

## 2016-09-12 ENCOUNTER — Encounter (HOSPITAL_COMMUNITY): Payer: Self-pay

## 2016-09-17 ENCOUNTER — Encounter (HOSPITAL_COMMUNITY): Payer: Medicare Other

## 2016-09-17 DIAGNOSIS — I2510 Atherosclerotic heart disease of native coronary artery without angina pectoris: Secondary | ICD-10-CM | POA: Insufficient documentation

## 2016-09-19 ENCOUNTER — Encounter (HOSPITAL_COMMUNITY): Payer: Medicare Other

## 2016-09-24 ENCOUNTER — Encounter (HOSPITAL_COMMUNITY)
Admission: RE | Admit: 2016-09-24 | Discharge: 2016-09-24 | Disposition: A | Discharge status: Home / Self Care | Payer: Medicare Other | Source: Ambulatory Visit | Attending: Cardiovascular Disease | Admitting: Cardiovascular Disease

## 2016-09-25 ENCOUNTER — Encounter (HOSPITAL_COMMUNITY): Payer: Medicare Other

## 2016-09-26 ENCOUNTER — Encounter (HOSPITAL_COMMUNITY)
Admission: RE | Admit: 2016-09-26 | Discharge: 2016-09-26 | Disposition: A | Discharge status: Home / Self Care | Payer: Medicare Other | Source: Ambulatory Visit | Attending: Cardiovascular Disease | Admitting: Cardiovascular Disease

## 2016-10-01 ENCOUNTER — Encounter (HOSPITAL_COMMUNITY)
Admission: RE | Admit: 2016-10-01 | Discharge: 2016-10-01 | Disposition: A | Discharge status: Home / Self Care | Payer: Medicare Other | Source: Ambulatory Visit | Attending: Cardiovascular Disease | Admitting: Cardiovascular Disease

## 2016-10-02 ENCOUNTER — Encounter (HOSPITAL_COMMUNITY): Payer: Medicare Other

## 2016-10-03 ENCOUNTER — Encounter (HOSPITAL_COMMUNITY): Payer: Medicare Other

## 2016-10-08 ENCOUNTER — Encounter (HOSPITAL_COMMUNITY): Payer: Medicare Other

## 2016-10-09 ENCOUNTER — Encounter (HOSPITAL_COMMUNITY): Payer: Medicare Other

## 2016-10-10 ENCOUNTER — Encounter (HOSPITAL_COMMUNITY)
Admission: RE | Admit: 2016-10-10 | Discharge: 2016-10-10 | Disposition: A | Discharge status: Home / Self Care | Payer: Medicare Other | Source: Ambulatory Visit | Attending: Cardiovascular Disease | Admitting: Cardiovascular Disease

## 2016-10-15 ENCOUNTER — Encounter (HOSPITAL_COMMUNITY)
Admission: RE | Admit: 2016-10-15 | Discharge: 2016-10-15 | Disposition: A | Discharge status: Home / Self Care | Payer: Medicare Other | Source: Ambulatory Visit | Attending: Cardiovascular Disease | Admitting: Cardiovascular Disease

## 2016-10-16 ENCOUNTER — Encounter (HOSPITAL_COMMUNITY): Payer: Medicare Other

## 2016-10-16 DIAGNOSIS — I2510 Atherosclerotic heart disease of native coronary artery without angina pectoris: Secondary | ICD-10-CM | POA: Insufficient documentation

## 2016-10-17 ENCOUNTER — Encounter (HOSPITAL_COMMUNITY): Payer: Medicare Other

## 2016-10-17 DIAGNOSIS — I2510 Atherosclerotic heart disease of native coronary artery without angina pectoris: Secondary | ICD-10-CM | POA: Diagnosis present

## 2016-10-22 ENCOUNTER — Encounter (HOSPITAL_COMMUNITY)
Admission: RE | Admit: 2016-10-22 | Discharge: 2016-10-22 | Disposition: A | Discharge status: Home / Self Care | Payer: Medicare Other | Source: Ambulatory Visit | Attending: Cardiovascular Disease | Admitting: Cardiovascular Disease

## 2016-10-23 ENCOUNTER — Encounter (HOSPITAL_COMMUNITY): Payer: Medicare Other

## 2016-10-24 ENCOUNTER — Encounter (HOSPITAL_COMMUNITY): Payer: Medicare Other

## 2016-10-29 ENCOUNTER — Encounter (HOSPITAL_COMMUNITY): Payer: Medicare Other

## 2016-10-30 ENCOUNTER — Encounter (HOSPITAL_COMMUNITY): Payer: Medicare Other

## 2016-10-31 ENCOUNTER — Encounter (HOSPITAL_COMMUNITY)
Admission: RE | Admit: 2016-10-31 | Discharge: 2016-10-31 | Disposition: A | Discharge status: Home / Self Care | Payer: Medicare Other | Source: Ambulatory Visit | Attending: Cardiovascular Disease | Admitting: Cardiovascular Disease

## 2016-10-31 ENCOUNTER — Other Ambulatory Visit: Payer: Self-pay | Admitting: Cardiovascular Disease

## 2016-11-05 ENCOUNTER — Encounter (HOSPITAL_COMMUNITY)
Admission: RE | Admit: 2016-11-05 | Discharge: 2016-11-05 | Disposition: A | Discharge status: Home / Self Care | Payer: Medicare Other | Source: Ambulatory Visit | Attending: Cardiovascular Disease | Admitting: Cardiovascular Disease

## 2016-11-06 ENCOUNTER — Encounter (HOSPITAL_COMMUNITY): Payer: Medicare Other

## 2016-11-07 ENCOUNTER — Encounter (HOSPITAL_COMMUNITY)
Admission: RE | Admit: 2016-11-07 | Discharge: 2016-11-07 | Disposition: A | Discharge status: Home / Self Care | Payer: Medicare Other | Source: Ambulatory Visit | Attending: Cardiovascular Disease | Admitting: Cardiovascular Disease

## 2016-11-12 ENCOUNTER — Encounter (HOSPITAL_COMMUNITY): Payer: Medicare Other

## 2016-11-12 ENCOUNTER — Telehealth: Payer: Self-pay | Admitting: Cardiovascular Disease

## 2016-11-12 DIAGNOSIS — I2510 Atherosclerotic heart disease of native coronary artery without angina pectoris: Secondary | ICD-10-CM

## 2016-11-12 DIAGNOSIS — R972 Elevated prostate specific antigen [PSA]: Secondary | ICD-10-CM

## 2016-11-12 DIAGNOSIS — R739 Hyperglycemia, unspecified: Secondary | ICD-10-CM

## 2016-11-12 NOTE — Telephone Encounter (Signed)
New Message:     Pt wanted you to know he is scheduled to see Dr Melburn Popper on 01-07-17. He says he needs you to order his lab work for that morning please. He said you also said to remind you to add to his order to check his A1C.

## 2016-11-13 ENCOUNTER — Encounter (HOSPITAL_COMMUNITY): Payer: Medicare Other

## 2016-11-13 NOTE — Telephone Encounter (Signed)
Left message for patient that lab orders have been placed and for patient to call back to schedule lab appointment

## 2016-11-14 ENCOUNTER — Encounter (HOSPITAL_COMMUNITY): Payer: Medicare Other

## 2016-11-19 ENCOUNTER — Encounter (HOSPITAL_COMMUNITY): Payer: Self-pay

## 2016-11-19 DIAGNOSIS — I2510 Atherosclerotic heart disease of native coronary artery without angina pectoris: Secondary | ICD-10-CM | POA: Insufficient documentation

## 2016-11-20 ENCOUNTER — Encounter (HOSPITAL_COMMUNITY): Payer: Self-pay

## 2016-11-21 ENCOUNTER — Encounter (HOSPITAL_COMMUNITY)
Admission: RE | Admit: 2016-11-21 | Discharge: 2016-11-21 | Disposition: A | Discharge status: Home / Self Care | Payer: Self-pay | Source: Ambulatory Visit | Attending: Cardiovascular Disease | Admitting: Cardiovascular Disease

## 2016-11-26 ENCOUNTER — Encounter (HOSPITAL_COMMUNITY)
Admission: RE | Admit: 2016-11-26 | Discharge: 2016-11-26 | Disposition: A | Discharge status: Home / Self Care | Payer: Self-pay | Source: Ambulatory Visit | Attending: Cardiovascular Disease | Admitting: Cardiovascular Disease

## 2016-11-27 ENCOUNTER — Encounter (HOSPITAL_COMMUNITY): Payer: Self-pay

## 2016-11-28 ENCOUNTER — Encounter (HOSPITAL_COMMUNITY): Payer: Self-pay

## 2016-12-03 ENCOUNTER — Encounter (HOSPITAL_COMMUNITY)
Admission: RE | Admit: 2016-12-03 | Discharge: 2016-12-03 | Disposition: A | Discharge status: Home / Self Care | Payer: Self-pay | Source: Ambulatory Visit | Attending: Cardiovascular Disease | Admitting: Cardiovascular Disease

## 2016-12-04 ENCOUNTER — Encounter (HOSPITAL_COMMUNITY): Payer: Self-pay

## 2016-12-05 ENCOUNTER — Encounter (HOSPITAL_COMMUNITY)
Admission: RE | Admit: 2016-12-05 | Discharge: 2016-12-05 | Disposition: A | Discharge status: Home / Self Care | Payer: Self-pay | Source: Ambulatory Visit | Attending: Cardiovascular Disease | Admitting: Cardiovascular Disease

## 2016-12-10 ENCOUNTER — Encounter (HOSPITAL_COMMUNITY)
Admission: RE | Admit: 2016-12-10 | Discharge: 2016-12-10 | Disposition: A | Discharge status: Home / Self Care | Payer: Self-pay | Source: Ambulatory Visit | Attending: Cardiovascular Disease | Admitting: Cardiovascular Disease

## 2016-12-11 ENCOUNTER — Encounter (HOSPITAL_COMMUNITY): Payer: Self-pay

## 2016-12-12 ENCOUNTER — Encounter (HOSPITAL_COMMUNITY): Payer: Self-pay

## 2016-12-17 ENCOUNTER — Encounter (HOSPITAL_COMMUNITY): Payer: Self-pay

## 2016-12-17 DIAGNOSIS — I2510 Atherosclerotic heart disease of native coronary artery without angina pectoris: Secondary | ICD-10-CM | POA: Insufficient documentation

## 2016-12-18 ENCOUNTER — Encounter (HOSPITAL_COMMUNITY): Payer: Self-pay

## 2016-12-19 ENCOUNTER — Encounter (HOSPITAL_COMMUNITY): Payer: Self-pay

## 2016-12-24 ENCOUNTER — Encounter (HOSPITAL_COMMUNITY): Payer: Self-pay

## 2016-12-25 ENCOUNTER — Encounter (HOSPITAL_COMMUNITY): Payer: Self-pay

## 2016-12-26 ENCOUNTER — Encounter (HOSPITAL_COMMUNITY): Payer: Self-pay

## 2016-12-31 ENCOUNTER — Encounter (HOSPITAL_COMMUNITY)
Admission: RE | Admit: 2016-12-31 | Discharge: 2016-12-31 | Disposition: A | Discharge status: Home / Self Care | Payer: Medicare Other | Source: Ambulatory Visit | Attending: Cardiovascular Disease | Admitting: Cardiovascular Disease

## 2017-01-01 ENCOUNTER — Encounter (HOSPITAL_COMMUNITY): Payer: Self-pay

## 2017-01-02 ENCOUNTER — Encounter (HOSPITAL_COMMUNITY): Payer: Self-pay

## 2017-01-02 ENCOUNTER — Other Ambulatory Visit: Payer: Medicare Other | Admitting: *Deleted

## 2017-01-02 DIAGNOSIS — R739 Hyperglycemia, unspecified: Secondary | ICD-10-CM

## 2017-01-02 DIAGNOSIS — R972 Elevated prostate specific antigen [PSA]: Secondary | ICD-10-CM

## 2017-01-02 DIAGNOSIS — I2510 Atherosclerotic heart disease of native coronary artery without angina pectoris: Secondary | ICD-10-CM

## 2017-01-03 LAB — HEPATIC FUNCTION PANEL
ALT: 19 IU/L (ref 0–44)
AST: 19 IU/L (ref 0–40)
Albumin: 4.4 g/dL (ref 3.5–4.8)
Alkaline Phosphatase: 53 IU/L (ref 39–117)
Bilirubin Total: 1.4 mg/dL — ABNORMAL HIGH (ref 0.0–1.2)
Bilirubin, Direct: 0.33 mg/dL (ref 0.00–0.40)
TOTAL PROTEIN: 6.5 g/dL (ref 6.0–8.5)

## 2017-01-03 LAB — LIPID PANEL
CHOL/HDL RATIO: 2.1 ratio (ref 0.0–5.0)
Cholesterol, Total: 113 mg/dL (ref 100–199)
HDL: 53 mg/dL (ref >39)
LDL Calculated: 47 mg/dL (ref 0–99)
Triglycerides: 65 mg/dL (ref 0–149)
VLDL Cholesterol Cal: 13 mg/dL (ref 5–40)

## 2017-01-03 LAB — BASIC METABOLIC PANEL
BUN / CREAT RATIO: 13 (ref 10–24)
BUN: 12 mg/dL (ref 8–27)
CHLORIDE: 103 mmol/L (ref 96–106)
CO2: 25 mmol/L (ref 20–29)
Calcium: 9.5 mg/dL (ref 8.6–10.2)
Creatinine, Ser: 0.93 mg/dL (ref 0.76–1.27)
GFR calc Af Amer: 94 mL/min/{1.73_m2} (ref >59)
GFR calc non Af Amer: 82 mL/min/{1.73_m2} (ref >59)
GLUCOSE: 113 mg/dL — AB (ref 65–99)
Potassium: 4.6 mmol/L (ref 3.5–5.2)
Sodium: 142 mmol/L (ref 134–144)

## 2017-01-03 LAB — HEMOGLOBIN A1C
Est. average glucose Bld gHb Est-mCnc: 111 mg/dL
HEMOGLOBIN A1C: 5.5 % (ref 4.8–5.6)

## 2017-01-07 ENCOUNTER — Encounter: Payer: Self-pay | Admitting: Cardiovascular Disease

## 2017-01-07 ENCOUNTER — Encounter (HOSPITAL_COMMUNITY): Payer: Self-pay

## 2017-01-07 ENCOUNTER — Ambulatory Visit (INDEPENDENT_AMBULATORY_CARE_PROVIDER_SITE_OTHER): Payer: Medicare Other | Admitting: Cardiovascular Disease

## 2017-01-07 VITALS — BP 144/80 | HR 58 | Ht 68.0 in | Wt 186.2 lb

## 2017-01-07 DIAGNOSIS — E782 Mixed hyperlipidemia: Secondary | ICD-10-CM | POA: Diagnosis not present

## 2017-01-07 DIAGNOSIS — I2510 Atherosclerotic heart disease of native coronary artery without angina pectoris: Secondary | ICD-10-CM

## 2017-01-07 DIAGNOSIS — I10 Essential (primary) hypertension: Secondary | ICD-10-CM

## 2017-01-07 MED ORDER — IRBESARTAN 150 MG PO TABS: 150.0000 mg | ORAL_TABLET | Freq: Every day | ORAL | 3 refills | Status: DC

## 2017-01-07 NOTE — Patient Instructions (Signed)
Medication Instructions:  Your physician has recommended you make the following change in your medication: INCREASE Irbesartan to 150 mg daily   Labwork: Your physician recommends that you return for lab work in: 3 weeks for basic metabolic panel   Testing/Procedures: None Ordered   Follow-Up: Your physician wants you to follow-up in: 6 months with Dr. Elease HashimotoNahser.  You will receive a reminder letter in the mail two months in advance. If you don't receive a letter, please call our office to schedule the follow-up appointment.   If you need a refill on your cardiac medications before your next appointment, please call your pharmacy.   Thank you for choosing [facility]! Eligha BridegroomMichelle Jarick Harkins, RN [phone]

## 2017-01-07 NOTE — Progress Notes (Signed)
Cardiology Office Note   Date:  01/07/2017   ID:  William Hernandez, DOB 1945-11-26, MRN [id number]  PCP:  Kaleen Mask, MD  Cardiologist:   Kristeen Miss, MD   Chief Complaint  Patient presents with  . Coronary Artery Disease   1. CAD ( CABG Oct. 6, 2003)  2. Dyslipidemia 3. Atrial fibrillation - post op from CABG.  4. Depression  History of Present Illness:  William Hernandez is a 71 year old gentleman with a history of coronary disease-status post coronary artery bypass grafting. He's done very well from a cardiac standpoint. He is participating in cardiac rehabilitation and has not had any episodes of chest pain or shortness breath.  He has done well. His lipids are well controlled.   He is participating in cardiac rehab and is doing well. 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 He doesn't have any episodes of chest pain or shortness breath when he is active. 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  He continues to have issues with anxiety.   July 14, 2012:  William Hernandez is doing well. He had a nose bleed several weeks ago. He is exercising regularly. No angina. No dyspnea.  Oct. 27, 2014:  He is doing well. 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 2 very hard games of 3 on 3 , half court game. No pain. Active. Exercises. Increased his Trazadone to help with some additional depression.   July 14 2013:  William Hernandez is doing well. He remains very active. Plays in the Senior games. No CP. Has has a URI for the past 10 days which has slowed him down a bit.   Oct. 26, 2015:  William Hernandez is doing well.  Bp is well controlled. Doing lots of fishing. Plays basketball on Wednesdays. No Cp .   Jul 21, 2014:  William Hernandez is a 71 y.o. male who presents for follow up of his CAD and hyperlipidemia Still exercising well.   Still playing basketball regularly.  No angina     01/17/2015: Doing well from a cardiac standpoint. Has had a head cold - difficulty hearing in his left ear   June 19, 2015:  William Hernandez is seen for follow up visit .  Still playing basketball regularly . No  CP or dyspnea.     Aug 02, 2015:  William Hernandez is seen back for an episode CP that occurred in Cardiac rehab yesterday   Feels it at cardiac rehab Also when he first starts out playing basket ball After he gets warmed up, he feels fine.  Also occurs when he is mowing the grass.    myoview today shows no large area of ishcmeia. LV function is mildly depressed with EF 45%   Sept. 7, 2017:  William Hernandez is seen today . Had a cardiac cath in May, 2017: 1. Severe three-vessel native coronary artery disease 2. Status post CABG with patent RIMA to PDA, LIMA to LAD, sequential saphenous vein graft to OM1 and OM2, and saphenous vein graft to first diagonal 3. 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 No angina   June 03, 2016:  Still playing basketball , no angina  Still fly fishing   Oct. 23, 2018:  Doing very well.  No angina  Plays basket ball regularly . Goes to cardiac rehab.  No angina     Past Medical History:  Diagnosis  Date  . Arrhythmia    afib  . Coronary artery disease    2003  . Depression   . Hyperlipidemia   . S/P hernia surgery     Past Surgical History:  Procedure Laterality Date  . CARDIAC CATHETERIZATION     11/2001  . CARDIAC CATHETERIZATION N/A 08/04/2015   Procedure: Left Heart Cath and Cors/Grafts Angiography;  Surgeon: Tonny Bollman, MD;  Location: [facility] INVASIVE CV LAB;  Service: Cardiovascular;  Laterality: N/A;  . CHOLECYSTECTOMY    . CORONARY ARTERY BYPASS GRAFT  2000  . HERNIA REPAIR       Current Outpatient Prescriptions  Medication Sig Dispense Refill  . aspirin 81 MG tablet Take 81 mg by mouth at bedtime.     Marland Kitchen atorvastatin (LIPITOR) 20 MG tablet TAKE 1 TABLET BY MOUTH ONCE DAILY 90  tablet 2  . fish oil-omega-3 fatty acids 1000 MG capsule Take 1 g by mouth daily.     . Flaxseed, Linseed, (FLAX SEED OIL PO) Take 1 tablet by mouth daily.     . Multiple Vitamin (MULTIVITAMIN PO) Take 1 tablet by mouth daily.     . nebivolol (BYSTOLIC) 5 MG tablet Take 1 tablet (5 mg total) by mouth daily. 30 tablet 8  . nitroGLYCERIN (NITROSTAT) 0.4 MG SL tablet Place 1 tablet (0.4 mg total) under the tongue every 5 (five) minutes as needed for chest pain. 25 tablet 3  . Probiotic Product (PROBIOTIC-10 PO) Take 1 tablet by mouth daily.    . ranitidine (ZANTAC) 150 MG capsule Take 150 mg by mouth every evening.      . sildenafil (REVATIO) 20 MG tablet as directed. Take 1-5 tablets by mouth daily as needed    . traZODone (DESYREL) 150 MG tablet Take 200 mg by mouth at bedtime as needed for sleep.   0  . irbesartan (AVAPRO) 150 MG tablet Take 1 tablet (150 mg total) by mouth daily. 90 tablet 3   No current facility-administered medications for this visit.     Allergies:   Ace inhibitors; Lisinopril; and Penicillins    Social History:  The patient  reports that he has never smoked. He has never used smokeless tobacco. He reports that he does not drink alcohol or use drugs.   Family History:  The patient's family history includes Heart disease in his father.   ROS:  Please see the history of present illness.   Review of Systems: Constitutional:  denies fever, chills, diaphoresis, appetite change and fatigue.  HEENT: denies photophobia, eye pain, redness, hearing loss, ear pain, congestion, sore throat, rhinorrhea, sneezing, neck pain, neck stiffness and tinnitus.  Respiratory: denies SOB, DOE, cough, chest tightness, and wheezing.  Cardiovascular: denies chest pain, palpitations and leg swelling.  Gastrointestinal: denies nausea, vomiting, abdominal pain, diarrhea, constipation, blood in stool.  Genitourinary: denies dysuria, urgency, frequency, hematuria, flank pain and difficulty  urinating.  Musculoskeletal: denies  myalgias, back pain, joint swelling, arthralgias and gait problem.   Skin: denies pallor, rash and wound.  Neurological: denies dizziness, seizures, syncope, weakness, light-headedness, numbness and headaches.   Hematological: denies adenopathy, easy bruising, personal or family bleeding history.  Psychiatric/ Behavioral: denies suicidal ideation, mood changes, confusion, nervousness, sleep disturbance and agitation.       All other systems are reviewed and negative.   Physical Exam: Blood pressure (!) 144/80, pulse (!) 58, height 5\' 8"  (1.727 m), weight 186 lb 3.2 oz (84.5 kg), SpO2 98 %.  GEN:  Well nourished, well developed  in no acute distress HEENT: Normal NECK: No JVD; No carotid bruits LYMPHATICS: No lymphadenopathy CARDIAC: RR, no murmurs, rubs, gallops RESPIRATORY:  Clear to auscultation without rales, wheezing or rhonchi  ABDOMEN: Soft, non-tender, non-distended MUSCULOSKELETAL:  No edema; No deformity  SKIN: Warm and dry NEUROLOGIC:  Alert and oriented x 3   EKG:     Recent Labs: 01/02/2017: ALT 19; BUN 12; Creatinine, Ser 0.93; Potassium 4.6; Sodium 142    Lipid Panel    Component Value Date/Time   CHOL 113 01/02/2017 0814   TRIG 65 01/02/2017 0814   HDL 53 01/02/2017 0814   CHOLHDL 2.1 01/02/2017 0814   CHOLHDL 2.2 11/23/2015 0858   VLDL 14 11/23/2015 0858   LDLCALC 47 01/02/2017 0814      Wt Readings from Last 3 Encounters:  01/07/17 186 lb 3.2 oz (84.5 kg)  06/03/16 185 lb 12.8 oz (84.3 kg)  11/23/15 187 lb 9.6 oz (85.1 kg)      Other studies Reviewed: Additional studies/ records that were reviewed today include: . Review of the above records demonstrates:    ASSESSMENT AND PLAN:  1. CAD ( CABG Oct. 6, 2003)  cath May 2017 -  1. Severe three-vessel native coronary artery disease 2. Status post CABG with patent RIMA to PDA, LIMA to LAD, sequential saphenous vein graft to OM1 and OM2, and saphenous vein  graft to first diagonal 3. Chronic occlusion of the saphenous graft to second diagonal   Doing well. Exercising regularly  Plays basket ball regularly .   No angina    2. Dyslipidemia-    Labs from last week look great.   Continue current meds  3. Atrial fibrillation -  No recurrent AFib   4. Depression -  Sees AmityBecky Kincaid,   5. HTN:    Slightly elevated ,  Increase Irbesartan to 150 mg a day ( he said he may want to try 75 mg BID )  Will check BMP in 3 weeks .    Current medicines are reviewed at length with the patient today.  The patient does not have concerns regarding medicines.  The following changes have been made:  no change  Labs/ tests ordered today include:   Orders Placed This Encounter  Procedures  . Basic Metabolic Panel (BMET)   Disposition:   FU with me in 6 months     Kristeen MissPhilip Presly Steinruck, MD  01/07/2017 11:49 AM    [facility] [address], [place], [place]  [address] Phone: [phone]; Fax: [phone]

## 2017-01-08 ENCOUNTER — Encounter (HOSPITAL_COMMUNITY): Payer: Self-pay

## 2017-01-09 ENCOUNTER — Encounter (HOSPITAL_COMMUNITY)
Admission: RE | Admit: 2017-01-09 | Discharge: 2017-01-09 | Disposition: A | Discharge status: Home / Self Care | Payer: Self-pay | Source: Ambulatory Visit | Attending: Cardiovascular Disease | Admitting: Cardiovascular Disease

## 2017-01-14 ENCOUNTER — Encounter (HOSPITAL_COMMUNITY): Payer: Self-pay

## 2017-01-15 ENCOUNTER — Encounter (HOSPITAL_COMMUNITY): Payer: Self-pay

## 2017-01-16 ENCOUNTER — Encounter (HOSPITAL_COMMUNITY): Payer: Self-pay

## 2017-01-16 DIAGNOSIS — I2510 Atherosclerotic heart disease of native coronary artery without angina pectoris: Secondary | ICD-10-CM | POA: Insufficient documentation

## 2017-01-21 ENCOUNTER — Encounter (HOSPITAL_COMMUNITY): Payer: Self-pay

## 2017-01-22 ENCOUNTER — Encounter (HOSPITAL_COMMUNITY): Payer: Self-pay

## 2017-01-23 ENCOUNTER — Encounter (HOSPITAL_COMMUNITY): Payer: Self-pay

## 2017-01-28 ENCOUNTER — Encounter (HOSPITAL_COMMUNITY)
Admission: RE | Admit: 2017-01-28 | Discharge: 2017-01-28 | Disposition: A | Discharge status: Home / Self Care | Payer: Self-pay | Source: Ambulatory Visit | Attending: Cardiovascular Disease | Admitting: Cardiovascular Disease

## 2017-01-28 ENCOUNTER — Other Ambulatory Visit: Payer: Medicare Other

## 2017-01-28 DIAGNOSIS — I10 Essential (primary) hypertension: Secondary | ICD-10-CM

## 2017-01-28 DIAGNOSIS — I2510 Atherosclerotic heart disease of native coronary artery without angina pectoris: Secondary | ICD-10-CM

## 2017-01-28 LAB — BASIC METABOLIC PANEL
BUN/Creatinine Ratio: 15 (ref 10–24)
BUN: 12 mg/dL (ref 8–27)
CO2: 23 mmol/L (ref 20–29)
CREATININE: 0.82 mg/dL (ref 0.76–1.27)
Calcium: 9.3 mg/dL (ref 8.6–10.2)
Chloride: 102 mmol/L (ref 96–106)
GFR, EST AFRICAN AMERICAN: 102 mL/min/{1.73_m2} (ref >59)
GFR, EST NON AFRICAN AMERICAN: 88 mL/min/{1.73_m2} (ref >59)
Glucose: 105 mg/dL — ABNORMAL HIGH (ref 65–99)
POTASSIUM: 4.3 mmol/L (ref 3.5–5.2)
SODIUM: 139 mmol/L (ref 134–144)

## 2017-01-29 ENCOUNTER — Encounter (HOSPITAL_COMMUNITY): Payer: Self-pay

## 2017-01-30 ENCOUNTER — Encounter (HOSPITAL_COMMUNITY): Payer: Self-pay

## 2017-02-04 ENCOUNTER — Encounter (HOSPITAL_COMMUNITY): Payer: Self-pay

## 2017-02-05 ENCOUNTER — Encounter (HOSPITAL_COMMUNITY): Payer: Self-pay

## 2017-02-11 ENCOUNTER — Encounter (HOSPITAL_COMMUNITY): Payer: Self-pay

## 2017-02-12 ENCOUNTER — Encounter (HOSPITAL_COMMUNITY): Payer: Self-pay

## 2017-02-13 ENCOUNTER — Encounter (HOSPITAL_COMMUNITY): Payer: Self-pay

## 2017-02-26 ENCOUNTER — Encounter (HOSPITAL_COMMUNITY): Admission: RE | Admit: 2017-02-26 | Payer: Self-pay | Source: Ambulatory Visit

## 2017-02-26 DIAGNOSIS — I2510 Atherosclerotic heart disease of native coronary artery without angina pectoris: Secondary | ICD-10-CM | POA: Insufficient documentation

## 2017-02-27 ENCOUNTER — Encounter (HOSPITAL_COMMUNITY): Payer: Self-pay

## 2017-03-04 ENCOUNTER — Encounter (HOSPITAL_COMMUNITY)
Admission: RE | Admit: 2017-03-04 | Discharge: 2017-03-04 | Disposition: A | Discharge status: Home / Self Care | Payer: Self-pay | Source: Ambulatory Visit | Attending: Cardiovascular Disease | Admitting: Cardiovascular Disease

## 2017-03-05 ENCOUNTER — Encounter (HOSPITAL_COMMUNITY): Payer: Self-pay

## 2017-03-06 ENCOUNTER — Encounter (HOSPITAL_COMMUNITY): Payer: Self-pay

## 2017-03-12 ENCOUNTER — Encounter (HOSPITAL_COMMUNITY): Payer: Self-pay

## 2017-03-13 ENCOUNTER — Encounter (HOSPITAL_COMMUNITY): Payer: Self-pay

## 2017-03-19 ENCOUNTER — Encounter (HOSPITAL_COMMUNITY): Payer: Self-pay

## 2017-03-19 DIAGNOSIS — I2510 Atherosclerotic heart disease of native coronary artery without angina pectoris: Secondary | ICD-10-CM | POA: Insufficient documentation

## 2017-03-20 ENCOUNTER — Encounter (HOSPITAL_COMMUNITY): Payer: Self-pay

## 2017-03-25 ENCOUNTER — Encounter (HOSPITAL_COMMUNITY): Payer: Self-pay

## 2017-03-26 ENCOUNTER — Encounter (HOSPITAL_COMMUNITY): Payer: Self-pay

## 2017-03-27 ENCOUNTER — Encounter (HOSPITAL_COMMUNITY): Payer: Self-pay

## 2017-04-01 ENCOUNTER — Encounter (HOSPITAL_COMMUNITY)
Admission: RE | Admit: 2017-04-01 | Discharge: 2017-04-01 | Disposition: A | Discharge status: Home / Self Care | Payer: Self-pay | Source: Ambulatory Visit | Attending: Cardiovascular Disease | Admitting: Cardiovascular Disease

## 2017-04-02 ENCOUNTER — Encounter (HOSPITAL_COMMUNITY): Payer: Self-pay

## 2017-04-03 ENCOUNTER — Encounter (HOSPITAL_COMMUNITY)
Admission: RE | Admit: 2017-04-03 | Discharge: 2017-04-03 | Disposition: A | Discharge status: Home / Self Care | Payer: Medicare Other | Source: Ambulatory Visit | Attending: Cardiovascular Disease | Admitting: Cardiovascular Disease

## 2017-04-08 ENCOUNTER — Encounter (HOSPITAL_COMMUNITY)
Admission: RE | Admit: 2017-04-08 | Discharge: 2017-04-08 | Disposition: A | Discharge status: Home / Self Care | Payer: Medicare Other | Source: Ambulatory Visit | Attending: Cardiovascular Disease | Admitting: Cardiovascular Disease

## 2017-04-09 ENCOUNTER — Encounter (HOSPITAL_COMMUNITY): Payer: Self-pay

## 2017-04-10 ENCOUNTER — Encounter (HOSPITAL_COMMUNITY): Payer: Self-pay

## 2017-04-15 ENCOUNTER — Encounter (HOSPITAL_COMMUNITY): Payer: Self-pay

## 2017-04-16 ENCOUNTER — Encounter (HOSPITAL_COMMUNITY): Payer: Self-pay

## 2017-04-17 ENCOUNTER — Encounter (HOSPITAL_COMMUNITY): Payer: Self-pay

## 2017-04-17 ENCOUNTER — Telehealth: Payer: Self-pay | Admitting: Cardiovascular Disease

## 2017-04-17 ENCOUNTER — Encounter: Payer: Self-pay | Admitting: Nurse Practitioner

## 2017-04-17 DIAGNOSIS — I2510 Atherosclerotic heart disease of native coronary artery without angina pectoris: Secondary | ICD-10-CM

## 2017-04-17 DIAGNOSIS — Z87898 Personal history of other specified conditions: Secondary | ICD-10-CM

## 2017-04-17 DIAGNOSIS — R972 Elevated prostate specific antigen [PSA]: Secondary | ICD-10-CM

## 2017-04-17 NOTE — Telephone Encounter (Signed)
New message   Pt says he is suppose to have labs before his appt on 4/25 but no order in epic

## 2017-04-17 NOTE — Telephone Encounter (Signed)
MyChart message to patient that I will order labs and schedule prior to his ov with Dr. Elease HashimotoNahser. I asked patient if he will need us to order a PSA.

## 2017-04-18 NOTE — Telephone Encounter (Signed)
Lab orders in epic and patient scheduled for lab appointment prior to ov with Dr. Elease HashimotoNahser

## 2017-04-22 ENCOUNTER — Encounter (HOSPITAL_COMMUNITY)
Admission: RE | Admit: 2017-04-22 | Discharge: 2017-04-22 | Disposition: A | Discharge status: Home / Self Care | Payer: Medicare Other | Source: Ambulatory Visit | Attending: Cardiovascular Disease | Admitting: Cardiovascular Disease

## 2017-04-22 DIAGNOSIS — I2510 Atherosclerotic heart disease of native coronary artery without angina pectoris: Secondary | ICD-10-CM | POA: Insufficient documentation

## 2017-04-23 ENCOUNTER — Encounter (HOSPITAL_COMMUNITY): Payer: Self-pay

## 2017-04-24 ENCOUNTER — Encounter (HOSPITAL_COMMUNITY)
Admission: RE | Admit: 2017-04-24 | Discharge: 2017-04-24 | Disposition: A | Discharge status: Home / Self Care | Payer: Medicare Other | Source: Ambulatory Visit | Attending: Cardiovascular Disease | Admitting: Cardiovascular Disease

## 2017-04-29 ENCOUNTER — Encounter (HOSPITAL_COMMUNITY)
Admission: RE | Admit: 2017-04-29 | Discharge: 2017-04-29 | Disposition: A | Discharge status: Home / Self Care | Payer: Medicare Other | Source: Ambulatory Visit | Attending: Cardiovascular Disease | Admitting: Cardiovascular Disease

## 2017-04-30 ENCOUNTER — Encounter (HOSPITAL_COMMUNITY): Payer: Self-pay

## 2017-05-01 ENCOUNTER — Encounter (HOSPITAL_COMMUNITY): Payer: Self-pay

## 2017-05-06 ENCOUNTER — Encounter (HOSPITAL_COMMUNITY)
Admission: RE | Admit: 2017-05-06 | Discharge: 2017-05-06 | Disposition: A | Discharge status: Home / Self Care | Payer: Medicare Other | Source: Ambulatory Visit | Attending: Cardiovascular Disease | Admitting: Cardiovascular Disease

## 2017-05-07 ENCOUNTER — Encounter (HOSPITAL_COMMUNITY): Payer: Self-pay

## 2017-05-08 ENCOUNTER — Encounter (HOSPITAL_COMMUNITY): Payer: Self-pay

## 2017-05-13 ENCOUNTER — Encounter (HOSPITAL_COMMUNITY): Payer: Self-pay

## 2017-05-14 ENCOUNTER — Encounter (HOSPITAL_COMMUNITY): Payer: Self-pay

## 2017-05-15 ENCOUNTER — Encounter (HOSPITAL_COMMUNITY): Payer: Self-pay

## 2017-05-20 ENCOUNTER — Encounter (HOSPITAL_COMMUNITY)
Admission: RE | Admit: 2017-05-20 | Discharge: 2017-05-20 | Disposition: A | Discharge status: Home / Self Care | Payer: Self-pay | Source: Ambulatory Visit | Attending: Cardiovascular Disease | Admitting: Cardiovascular Disease

## 2017-05-20 DIAGNOSIS — I2510 Atherosclerotic heart disease of native coronary artery without angina pectoris: Secondary | ICD-10-CM | POA: Insufficient documentation

## 2017-05-21 ENCOUNTER — Encounter (HOSPITAL_COMMUNITY): Payer: Self-pay

## 2017-05-22 ENCOUNTER — Encounter (HOSPITAL_COMMUNITY): Payer: Self-pay

## 2017-05-27 ENCOUNTER — Encounter (HOSPITAL_COMMUNITY)
Admission: RE | Admit: 2017-05-27 | Discharge: 2017-05-27 | Disposition: A | Discharge status: Home / Self Care | Payer: Self-pay | Source: Ambulatory Visit | Attending: Cardiovascular Disease | Admitting: Cardiovascular Disease

## 2017-05-27 ENCOUNTER — Encounter (HOSPITAL_COMMUNITY): Payer: Self-pay

## 2017-05-28 ENCOUNTER — Encounter (HOSPITAL_COMMUNITY): Payer: Self-pay

## 2017-05-29 ENCOUNTER — Encounter (HOSPITAL_COMMUNITY): Payer: Self-pay

## 2017-06-03 ENCOUNTER — Encounter (HOSPITAL_COMMUNITY)
Admission: RE | Admit: 2017-06-03 | Discharge: 2017-06-03 | Disposition: A | Discharge status: Home / Self Care | Payer: Self-pay | Source: Ambulatory Visit | Attending: Cardiovascular Disease | Admitting: Cardiovascular Disease

## 2017-06-03 ENCOUNTER — Encounter (HOSPITAL_COMMUNITY): Payer: Self-pay

## 2017-06-04 ENCOUNTER — Encounter (HOSPITAL_COMMUNITY): Payer: Self-pay

## 2017-06-05 ENCOUNTER — Encounter (HOSPITAL_COMMUNITY): Payer: Self-pay

## 2017-06-10 ENCOUNTER — Other Ambulatory Visit: Payer: Self-pay | Admitting: Cardiovascular Disease

## 2017-06-10 ENCOUNTER — Encounter (HOSPITAL_COMMUNITY): Payer: Self-pay

## 2017-06-10 ENCOUNTER — Encounter (HOSPITAL_COMMUNITY)
Admission: RE | Admit: 2017-06-10 | Discharge: 2017-06-10 | Disposition: A | Discharge status: Home / Self Care | Payer: Self-pay | Source: Ambulatory Visit | Attending: Cardiovascular Disease | Admitting: Cardiovascular Disease

## 2017-06-11 ENCOUNTER — Encounter (HOSPITAL_COMMUNITY): Payer: Self-pay

## 2017-06-12 ENCOUNTER — Encounter (HOSPITAL_COMMUNITY): Payer: Self-pay

## 2017-06-17 ENCOUNTER — Encounter (HOSPITAL_COMMUNITY): Payer: Self-pay

## 2017-06-17 DIAGNOSIS — I2510 Atherosclerotic heart disease of native coronary artery without angina pectoris: Secondary | ICD-10-CM | POA: Insufficient documentation

## 2017-06-18 ENCOUNTER — Encounter (HOSPITAL_COMMUNITY): Payer: Self-pay

## 2017-06-19 ENCOUNTER — Encounter (HOSPITAL_COMMUNITY): Payer: Self-pay

## 2017-06-24 ENCOUNTER — Encounter (HOSPITAL_COMMUNITY): Payer: Self-pay

## 2017-06-24 ENCOUNTER — Encounter (HOSPITAL_COMMUNITY)
Admission: RE | Admit: 2017-06-24 | Discharge: 2017-06-24 | Disposition: A | Discharge status: Home / Self Care | Payer: Self-pay | Source: Ambulatory Visit | Attending: Cardiovascular Disease | Admitting: Cardiovascular Disease

## 2017-06-25 ENCOUNTER — Encounter (HOSPITAL_COMMUNITY): Payer: Self-pay

## 2017-06-26 ENCOUNTER — Encounter (HOSPITAL_COMMUNITY): Payer: Self-pay

## 2017-06-26 ENCOUNTER — Encounter (HOSPITAL_COMMUNITY)
Admission: RE | Admit: 2017-06-26 | Discharge: 2017-06-26 | Disposition: A | Discharge status: Home / Self Care | Payer: Self-pay | Source: Ambulatory Visit | Attending: Cardiovascular Disease | Admitting: Cardiovascular Disease

## 2017-07-01 ENCOUNTER — Encounter (HOSPITAL_COMMUNITY): Payer: Self-pay

## 2017-07-02 ENCOUNTER — Encounter (HOSPITAL_COMMUNITY): Payer: Self-pay

## 2017-07-03 ENCOUNTER — Encounter (HOSPITAL_COMMUNITY): Payer: Self-pay

## 2017-07-08 ENCOUNTER — Encounter (HOSPITAL_COMMUNITY): Payer: Self-pay

## 2017-07-08 ENCOUNTER — Encounter (HOSPITAL_COMMUNITY)
Admission: RE | Admit: 2017-07-08 | Discharge: 2017-07-08 | Disposition: A | Discharge status: Home / Self Care | Payer: Self-pay | Source: Ambulatory Visit | Attending: Cardiovascular Disease | Admitting: Cardiovascular Disease

## 2017-07-08 ENCOUNTER — Other Ambulatory Visit: Payer: Medicare Other | Admitting: *Deleted

## 2017-07-08 DIAGNOSIS — I2510 Atherosclerotic heart disease of native coronary artery without angina pectoris: Secondary | ICD-10-CM

## 2017-07-08 DIAGNOSIS — R972 Elevated prostate specific antigen [PSA]: Secondary | ICD-10-CM

## 2017-07-08 LAB — HEPATIC FUNCTION PANEL
ALBUMIN: 4.3 g/dL (ref 3.5–4.8)
ALT: 19 IU/L (ref 0–44)
AST: 14 IU/L (ref 0–40)
Alkaline Phosphatase: 52 IU/L (ref 39–117)
BILIRUBIN TOTAL: 1.5 mg/dL — AB (ref 0.0–1.2)
Bilirubin, Direct: 0.31 mg/dL (ref 0.00–0.40)
Total Protein: 6.5 g/dL (ref 6.0–8.5)

## 2017-07-08 LAB — BASIC METABOLIC PANEL
BUN / CREAT RATIO: 13 (ref 10–24)
BUN: 12 mg/dL (ref 8–27)
CO2: 24 mmol/L (ref 20–29)
Calcium: 9.4 mg/dL (ref 8.6–10.2)
Chloride: 103 mmol/L (ref 96–106)
Creatinine, Ser: 0.9 mg/dL (ref 0.76–1.27)
GFR, EST AFRICAN AMERICAN: 98 mL/min/{1.73_m2} (ref >59)
GFR, EST NON AFRICAN AMERICAN: 84 mL/min/{1.73_m2} (ref >59)
Glucose: 109 mg/dL — ABNORMAL HIGH (ref 65–99)
POTASSIUM: 4 mmol/L (ref 3.5–5.2)
SODIUM: 140 mmol/L (ref 134–144)

## 2017-07-08 LAB — LIPID PANEL
CHOL/HDL RATIO: 2.2 ratio (ref 0.0–5.0)
Cholesterol, Total: 111 mg/dL (ref 100–199)
HDL: 51 mg/dL (ref >39)
LDL CALC: 47 mg/dL (ref 0–99)
TRIGLYCERIDES: 66 mg/dL (ref 0–149)
VLDL Cholesterol Cal: 13 mg/dL (ref 5–40)

## 2017-07-08 LAB — PSA: PROSTATE SPECIFIC AG, SERUM: 2.1 ng/mL (ref 0.0–4.0)

## 2017-07-09 ENCOUNTER — Encounter (HOSPITAL_COMMUNITY): Payer: Self-pay

## 2017-07-10 ENCOUNTER — Encounter (HOSPITAL_COMMUNITY): Payer: Self-pay

## 2017-07-10 ENCOUNTER — Ambulatory Visit (INDEPENDENT_AMBULATORY_CARE_PROVIDER_SITE_OTHER): Payer: Medicare Other | Admitting: Cardiovascular Disease

## 2017-07-10 ENCOUNTER — Encounter: Payer: Self-pay | Admitting: Cardiovascular Disease

## 2017-07-10 VITALS — BP 128/86 | HR 55 | Ht 68.0 in | Wt 187.6 lb

## 2017-07-10 DIAGNOSIS — R7309 Other abnormal glucose: Secondary | ICD-10-CM

## 2017-07-10 DIAGNOSIS — E782 Mixed hyperlipidemia: Secondary | ICD-10-CM | POA: Diagnosis not present

## 2017-07-10 DIAGNOSIS — R972 Elevated prostate specific antigen [PSA]: Secondary | ICD-10-CM

## 2017-07-10 DIAGNOSIS — N429 Disorder of prostate, unspecified: Secondary | ICD-10-CM

## 2017-07-10 DIAGNOSIS — I2510 Atherosclerotic heart disease of native coronary artery without angina pectoris: Secondary | ICD-10-CM | POA: Diagnosis not present

## 2017-07-10 NOTE — Progress Notes (Signed)
Cardiology Office Note   Date:  07/10/2017   ID:  William Hernandez, DOB 04/22/1945, MRN [id number]  PCP:  Kaleen MaskElkins, Wilson Oliver, MD  Cardiologist:   Kristeen MissPhilip Hilary Milks, MD   Chief Complaint  Patient presents with  . Cardiomyopathy   1. CAD ( CABG Oct. 6, 2003)  2. Dyslipidemia 3. Atrial fibrillation - post op from CABG.  4. Depression     William Hernandez is a [age] year old gentleman with a history of coronary disease-status post coronary artery bypass grafting. He's done very well from a cardiac standpoint. He is participating in cardiac rehabilitation and has not had any episodes of chest pain or shortness breath.  He has done well. His lipids are well controlled.   He is participating in cardiac rehab and is doing well. 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 He doesn't have any episodes of chest pain or shortness breath when he is active. He also competed in the [employer] senior [employer] basketball shooting contest.  He continues to have issues with anxiety.   July 14, 2012:  William Hernandez is doing well. He had a nose bleed several weeks ago. He is exercising regularly. No angina. No dyspnea.  Oct. 27, 2014:  He is doing well. 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 2 very hard games of 3 on 3 , half court game. No pain. Active. Exercises. Increased his Trazadone to help with some additional depression.   July 14 2013:  William Hernandez is doing well. He remains very active. Plays in the Senior games. No CP. Has has a URI for the past 10 days which has slowed him down a bit.   Oct. 26, 2015:  William Hernandez is doing well.  Bp is well controlled. Doing lots of fishing. Plays basketball on Wednesdays. No Cp .   Jul 21, 2014:  William KaufmannLawrence D Hernandez is a 72 y.o. male who presents for follow up of his CAD and hyperlipidemia Still exercising well.   Still playing basketball regularly.  No angina    01/17/2015: Doing well from a  cardiac standpoint. Has had a head cold - difficulty hearing in his left ear   June 19, 2015:  William Hernandez is seen for follow up visit .  Still playing basketball regularly . No  CP or dyspnea.     Aug 02, 2015:  William Hernandez is seen back for an episode CP that occurred in Cardiac rehab yesterday   Feels it at cardiac rehab Also when he first starts out playing basket ball After he gets warmed up, he feels fine.  Also occurs when he is mowing the grass.    myoview today shows no large area of ishcmeia. LV function is mildly depressed with EF 45%   Sept. 7, 2017:  William Hernandez is seen today . Had a cardiac cath in May, 2017: 1. Severe three-vessel native coronary artery disease 2. Status post CABG with patent RIMA to PDA, LIMA to LAD, sequential saphenous vein graft to OM1 and OM2, and saphenous vein graft to first diagonal 3. 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 No angina   June 03, 2016:  Still playing basketball , no angina  Still fly fishing   Oct. 23, 2018:  Doing very well.  No angina  Plays basket ball regularly . Goes to cardiac rehab.  No angina   April 25 , 2109:  William Hernandez is doing well  Patrici Ranks the senior games softball throw today .   Will be playing on BB team  Exercises regularly , no angina   Past Medical History:  Diagnosis Date  . Arrhythmia    afib  . Coronary artery disease    2003  . Depression   . Hyperlipidemia   . S/P hernia surgery     Past Surgical History:  Procedure Laterality Date  . CARDIAC CATHETERIZATION     11/2001  . CARDIAC CATHETERIZATION N/A 08/04/2015   Procedure: Left Heart Cath and Cors/Grafts Angiography;  Surgeon: Tonny Bollman, MD;  Location: [facility] INVASIVE CV LAB;  Service: Cardiovascular;  Laterality: N/A;  . CHOLECYSTECTOMY    . CORONARY ARTERY BYPASS GRAFT  2000  . HERNIA REPAIR       Current Outpatient Medications  Medication Sig Dispense Refill  . aspirin 81 MG tablet  Take 81 mg by mouth at bedtime.     Marland Kitchen atorvastatin (LIPITOR) 20 MG tablet TAKE 1 TABLET BY MOUTH ONCE DAILY 90 tablet 2  . BYSTOLIC 5 MG tablet TAKE 1 TABLET (5 MG TOTAL) BY MOUTH DAILY. 30 tablet 6  . fish oil-omega-3 fatty acids 1000 MG capsule Take 1 g by mouth daily.     . Flaxseed, Linseed, (FLAX SEED OIL PO) Take 1 tablet by mouth daily.     . irbesartan (AVAPRO) 150 MG tablet Take 1 tablet (150 mg total) by mouth daily. 90 tablet 3  . Multiple Vitamin (MULTIVITAMIN PO) Take 1 tablet by mouth daily.     . nitroGLYCERIN (NITROSTAT) 0.4 MG SL tablet Place 1 tablet (0.4 mg total) under the tongue every 5 (five) minutes as needed for chest pain. 25 tablet 3  . Probiotic Product (PROBIOTIC-10 PO) Take 1 tablet by mouth daily.    . ranitidine (ZANTAC) 150 MG capsule Take 150 mg by mouth every evening.      . sildenafil (REVATIO) 20 MG tablet as directed. Take 1-5 tablets by mouth daily as needed    . traZODone (DESYREL) 150 MG tablet Take 200 mg by mouth at bedtime as needed for sleep.   0   No current facility-administered medications for this visit.     Allergies:   Ace inhibitors; Lisinopril; and Penicillins    Social History:  The patient  reports that he has never smoked. He has never used smokeless tobacco. He reports that he does not drink alcohol or use drugs.   Family History:  The patient's family history includes Heart disease in his father.   ROS:    Noted in current hx, otherwise negativ   Physical Exam: Blood pressure 128/86, pulse (!) 55, height 5\' 8"  (1.727 m), weight 187 lb 9.6 oz (85.1 kg), SpO2 96 %.  GEN:  Well nourished, well developed in no acute distress HEENT: Normal NECK: No JVD; No carotid bruits LYMPHATICS: No lymphadenopathy CARDIAC: RR, no murmurs, rubs, gallops RESPIRATORY:  Clear to auscultation without rales, wheezing or rhonchi  ABDOMEN: Soft, non-tender, non-distended MUSCULOSKELETAL:  No edema; No deformity  SKIN: Warm and dry NEUROLOGIC:   Alert and oriented x 3   EKG:     25th, 2018: Sinus bradycardia 55.  Voltage criteria for left ventricular hypertrophy.  Recent Labs: 07/08/2017: ALT 19; BUN 12; Creatinine, Ser 0.90; Potassium 4.0; Sodium 140    Lipid Panel    Component Value Date/Time   CHOL 111 07/08/2017 0800   TRIG 66 07/08/2017 0800   HDL 51 07/08/2017 0800   CHOLHDL 2.2 07/08/2017 0800  CHOLHDL 2.2 11/23/2015 0858   VLDL 14 11/23/2015 0858   LDLCALC 47 07/08/2017 0800      Wt Readings from Last 3 Encounters:  07/10/17 187 lb 9.6 oz (85.1 kg)  01/07/17 186 lb 3.2 oz (84.5 kg)  06/03/16 185 lb 12.8 oz (84.3 kg)      Other studies Reviewed: Additional studies/ records that were reviewed today include: . Review of the above records demonstrates:    ASSESSMENT AND PLAN:  1. CAD ( CABG Oct. 6, 2003)  cath May 2017 -  1. Severe three-vessel native coronary artery disease 2. Status post CABG with patent RIMA to PDA, LIMA to LAD, sequential saphenous vein graft to OM1 and OM2, and saphenous vein graft to first diagonal 3. 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  Is not having any episodes of chest discomfort.  2. Dyslipidemia-    Lipids look stable  3. Atrial fibrillation -no recurrent atrial fibrillation.  4. Depression -sees Emiliano Dyer on a regular basis.  5. HTN:     BP is better.  .    Current medicines are reviewed at length with the patient today.  The patient does not have concerns regarding medicines.  The following changes have been made:  no change  Labs/ tests ordered today include:   No orders of the defined types were placed in this encounter.  Disposition:   FU with APP  in 6 months   With labs.  I'll see him in 1 year   Kristeen Miss, MD  07/10/2017 3:30 PM    [facility] [address] [place], [place], [place]  [address] Phone: [phone]; Fax: [phone]

## 2017-07-10 NOTE — Patient Instructions (Signed)
Medication Instructions:  Your physician recommends that you continue on your current medications as directed. Please refer to the Current Medication list given to you today.   Labwork: Your physician recommends that you return for lab work in: 6 months on the day of or a few days before your office visit   You will need to FAST for this appointment - nothing to eat or drink after midnight the night before except water.   Testing/Procedures: None Ordered   Follow-Up: Your physician wants you to follow-up in: 6 months with Tereso NewcomerScott Weaver, PA. You will receive a reminder letter in the mail two months in advance. If you don't receive a letter, please call our office to schedule the follow-up appointment.   If you need a refill on your cardiac medications before your next appointment, please call your pharmacy.   Thank you for choosing [facility]! Eligha BridegroomMichelle Brelan Hannen, RN [phone]

## 2017-07-10 NOTE — Addendum Note (Signed)
Addended by: Levi AlandSWINYER, MICHELLE M on: 07/10/2017 04:01 PM   Modules accepted: Orders

## 2017-07-15 ENCOUNTER — Encounter (HOSPITAL_COMMUNITY)
Admission: RE | Admit: 2017-07-15 | Discharge: 2017-07-15 | Disposition: A | Discharge status: Home / Self Care | Payer: Self-pay | Source: Ambulatory Visit | Attending: Cardiovascular Disease | Admitting: Cardiovascular Disease

## 2017-07-15 ENCOUNTER — Encounter (HOSPITAL_COMMUNITY): Payer: Self-pay

## 2017-07-16 ENCOUNTER — Encounter (HOSPITAL_COMMUNITY): Payer: Self-pay

## 2017-07-16 DIAGNOSIS — I2510 Atherosclerotic heart disease of native coronary artery without angina pectoris: Secondary | ICD-10-CM | POA: Insufficient documentation

## 2017-07-17 ENCOUNTER — Encounter (HOSPITAL_COMMUNITY)
Admission: RE | Admit: 2017-07-17 | Discharge: 2017-07-17 | Disposition: A | Discharge status: Home / Self Care | Payer: Self-pay | Source: Ambulatory Visit | Attending: Cardiovascular Disease | Admitting: Cardiovascular Disease

## 2017-07-17 ENCOUNTER — Encounter (HOSPITAL_COMMUNITY): Payer: Self-pay

## 2017-07-22 ENCOUNTER — Encounter (HOSPITAL_COMMUNITY)
Admission: RE | Admit: 2017-07-22 | Discharge: 2017-07-22 | Disposition: A | Discharge status: Home / Self Care | Payer: Self-pay | Source: Ambulatory Visit | Attending: Cardiovascular Disease | Admitting: Cardiovascular Disease

## 2017-07-22 ENCOUNTER — Encounter (HOSPITAL_COMMUNITY): Payer: Self-pay

## 2017-07-23 ENCOUNTER — Encounter (HOSPITAL_COMMUNITY): Payer: Self-pay

## 2017-07-24 ENCOUNTER — Encounter (HOSPITAL_COMMUNITY)
Admission: RE | Admit: 2017-07-24 | Discharge: 2017-07-24 | Disposition: A | Discharge status: Home / Self Care | Payer: Self-pay | Source: Ambulatory Visit | Attending: Cardiovascular Disease | Admitting: Cardiovascular Disease

## 2017-07-24 ENCOUNTER — Encounter (HOSPITAL_COMMUNITY): Payer: Self-pay

## 2017-07-28 ENCOUNTER — Other Ambulatory Visit: Payer: Self-pay | Admitting: Cardiovascular Disease

## 2017-07-29 ENCOUNTER — Encounter (HOSPITAL_COMMUNITY)
Admission: RE | Admit: 2017-07-29 | Discharge: 2017-07-29 | Disposition: A | Discharge status: Home / Self Care | Payer: Self-pay | Source: Ambulatory Visit | Attending: Cardiovascular Disease | Admitting: Cardiovascular Disease

## 2017-07-29 ENCOUNTER — Encounter (HOSPITAL_COMMUNITY): Payer: Self-pay

## 2017-07-30 ENCOUNTER — Encounter (HOSPITAL_COMMUNITY): Payer: Self-pay

## 2017-07-31 ENCOUNTER — Encounter (HOSPITAL_COMMUNITY)
Admission: RE | Admit: 2017-07-31 | Discharge: 2017-07-31 | Disposition: A | Discharge status: Home / Self Care | Payer: Medicare Other | Source: Ambulatory Visit | Attending: Cardiovascular Disease | Admitting: Cardiovascular Disease

## 2017-07-31 ENCOUNTER — Encounter (HOSPITAL_COMMUNITY): Payer: Self-pay

## 2017-08-05 ENCOUNTER — Encounter (HOSPITAL_COMMUNITY): Payer: Self-pay

## 2017-08-06 ENCOUNTER — Encounter (HOSPITAL_COMMUNITY): Payer: Self-pay

## 2017-08-07 ENCOUNTER — Encounter (HOSPITAL_COMMUNITY): Payer: Self-pay

## 2017-08-12 ENCOUNTER — Encounter (HOSPITAL_COMMUNITY): Payer: Self-pay

## 2017-08-12 ENCOUNTER — Encounter (HOSPITAL_COMMUNITY)
Admission: RE | Admit: 2017-08-12 | Discharge: 2017-08-12 | Disposition: A | Discharge status: Home / Self Care | Payer: Medicare Other | Source: Ambulatory Visit | Attending: Cardiovascular Disease | Admitting: Cardiovascular Disease

## 2017-08-13 ENCOUNTER — Encounter (HOSPITAL_COMMUNITY): Payer: Self-pay

## 2017-08-14 ENCOUNTER — Encounter (HOSPITAL_COMMUNITY)
Admission: RE | Admit: 2017-08-14 | Discharge: 2017-08-14 | Disposition: A | Discharge status: Home / Self Care | Payer: Medicare Other | Source: Ambulatory Visit | Attending: Cardiovascular Disease | Admitting: Cardiovascular Disease

## 2017-08-14 ENCOUNTER — Encounter (HOSPITAL_COMMUNITY): Payer: Self-pay

## 2017-08-19 ENCOUNTER — Encounter (HOSPITAL_COMMUNITY)
Admission: RE | Admit: 2017-08-19 | Discharge: 2017-08-19 | Disposition: A | Discharge status: Home / Self Care | Payer: Self-pay | Source: Ambulatory Visit | Attending: Cardiovascular Disease | Admitting: Cardiovascular Disease

## 2017-08-19 ENCOUNTER — Encounter (HOSPITAL_COMMUNITY): Payer: Self-pay

## 2017-08-19 DIAGNOSIS — I2510 Atherosclerotic heart disease of native coronary artery without angina pectoris: Secondary | ICD-10-CM | POA: Insufficient documentation

## 2017-08-20 ENCOUNTER — Encounter (HOSPITAL_COMMUNITY): Payer: Self-pay

## 2017-08-21 ENCOUNTER — Encounter (HOSPITAL_COMMUNITY): Payer: Self-pay

## 2017-08-26 ENCOUNTER — Encounter (HOSPITAL_COMMUNITY): Payer: Self-pay

## 2017-08-27 ENCOUNTER — Encounter (HOSPITAL_COMMUNITY): Payer: Self-pay

## 2017-08-28 ENCOUNTER — Encounter (HOSPITAL_COMMUNITY)
Admission: RE | Admit: 2017-08-28 | Discharge: 2017-08-28 | Disposition: A | Discharge status: Home / Self Care | Payer: Medicare Other | Source: Ambulatory Visit | Attending: Cardiovascular Disease | Admitting: Cardiovascular Disease

## 2017-08-28 ENCOUNTER — Encounter (HOSPITAL_COMMUNITY): Payer: Self-pay

## 2017-09-02 ENCOUNTER — Encounter (HOSPITAL_COMMUNITY): Payer: Self-pay

## 2017-09-03 ENCOUNTER — Encounter (HOSPITAL_COMMUNITY): Payer: Self-pay

## 2017-09-04 ENCOUNTER — Encounter (HOSPITAL_COMMUNITY): Payer: Self-pay

## 2017-09-04 ENCOUNTER — Encounter (HOSPITAL_COMMUNITY)
Admission: RE | Admit: 2017-09-04 | Discharge: 2017-09-04 | Disposition: A | Discharge status: Home / Self Care | Payer: Medicare Other | Source: Ambulatory Visit | Attending: Cardiovascular Disease | Admitting: Cardiovascular Disease

## 2017-09-05 ENCOUNTER — Encounter: Payer: Self-pay | Admitting: Cardiovascular Disease

## 2017-09-09 ENCOUNTER — Encounter (HOSPITAL_COMMUNITY)
Admission: RE | Admit: 2017-09-09 | Discharge: 2017-09-09 | Disposition: A | Discharge status: Home / Self Care | Payer: Medicare Other | Source: Ambulatory Visit | Attending: Cardiovascular Disease | Admitting: Cardiovascular Disease

## 2017-09-09 ENCOUNTER — Encounter (HOSPITAL_COMMUNITY): Payer: Self-pay

## 2017-09-10 ENCOUNTER — Encounter (HOSPITAL_COMMUNITY): Payer: Self-pay

## 2017-09-11 ENCOUNTER — Encounter (HOSPITAL_COMMUNITY): Payer: Self-pay

## 2017-09-11 ENCOUNTER — Encounter (HOSPITAL_COMMUNITY)
Admission: RE | Admit: 2017-09-11 | Discharge: 2017-09-11 | Disposition: A | Discharge status: Home / Self Care | Payer: Self-pay | Source: Ambulatory Visit | Attending: Cardiovascular Disease | Admitting: Cardiovascular Disease

## 2017-09-16 ENCOUNTER — Encounter (HOSPITAL_COMMUNITY): Payer: Self-pay

## 2017-09-16 DIAGNOSIS — Z951 Presence of aortocoronary bypass graft: Secondary | ICD-10-CM | POA: Insufficient documentation

## 2017-09-23 ENCOUNTER — Encounter (HOSPITAL_COMMUNITY): Payer: Self-pay

## 2017-09-25 ENCOUNTER — Encounter (HOSPITAL_COMMUNITY)
Admission: RE | Admit: 2017-09-25 | Discharge: 2017-09-25 | Disposition: A | Discharge status: Home / Self Care | Payer: Self-pay | Source: Ambulatory Visit | Attending: Cardiovascular Disease | Admitting: Cardiovascular Disease

## 2017-09-30 ENCOUNTER — Encounter (HOSPITAL_COMMUNITY)
Admission: RE | Admit: 2017-09-30 | Discharge: 2017-09-30 | Disposition: A | Discharge status: Home / Self Care | Payer: Self-pay | Source: Ambulatory Visit | Attending: Cardiovascular Disease | Admitting: Cardiovascular Disease

## 2017-10-02 ENCOUNTER — Encounter (HOSPITAL_COMMUNITY)
Admission: RE | Admit: 2017-10-02 | Discharge: 2017-10-02 | Disposition: A | Discharge status: Home / Self Care | Payer: Self-pay | Source: Ambulatory Visit | Attending: Cardiovascular Disease | Admitting: Cardiovascular Disease

## 2017-10-07 ENCOUNTER — Encounter (HOSPITAL_COMMUNITY): Payer: Self-pay

## 2017-10-09 ENCOUNTER — Encounter (HOSPITAL_COMMUNITY)
Admission: RE | Admit: 2017-10-09 | Discharge: 2017-10-09 | Disposition: A | Discharge status: Home / Self Care | Payer: Self-pay | Source: Ambulatory Visit | Attending: Cardiovascular Disease | Admitting: Cardiovascular Disease

## 2017-10-14 ENCOUNTER — Encounter (HOSPITAL_COMMUNITY)
Admission: RE | Admit: 2017-10-14 | Discharge: 2017-10-14 | Disposition: A | Discharge status: Home / Self Care | Payer: Self-pay | Source: Ambulatory Visit | Attending: Cardiovascular Disease | Admitting: Cardiovascular Disease

## 2017-10-16 ENCOUNTER — Encounter (HOSPITAL_COMMUNITY): Payer: Self-pay

## 2017-10-16 DIAGNOSIS — Z951 Presence of aortocoronary bypass graft: Secondary | ICD-10-CM | POA: Insufficient documentation

## 2017-10-21 ENCOUNTER — Encounter (HOSPITAL_COMMUNITY)
Admission: RE | Admit: 2017-10-21 | Discharge: 2017-10-21 | Disposition: A | Discharge status: Home / Self Care | Payer: Medicare Other | Source: Ambulatory Visit | Attending: Cardiovascular Disease | Admitting: Cardiovascular Disease

## 2017-10-23 ENCOUNTER — Encounter (HOSPITAL_COMMUNITY)
Admission: RE | Admit: 2017-10-23 | Discharge: 2017-10-23 | Disposition: A | Discharge status: Home / Self Care | Payer: Medicare Other | Source: Ambulatory Visit | Attending: Cardiovascular Disease | Admitting: Cardiovascular Disease

## 2017-10-28 ENCOUNTER — Encounter (HOSPITAL_COMMUNITY)
Admission: RE | Admit: 2017-10-28 | Discharge: 2017-10-28 | Disposition: A | Discharge status: Home / Self Care | Payer: Self-pay | Source: Ambulatory Visit | Attending: Cardiovascular Disease | Admitting: Cardiovascular Disease

## 2017-10-30 ENCOUNTER — Encounter (HOSPITAL_COMMUNITY)
Admission: RE | Admit: 2017-10-30 | Discharge: 2017-10-30 | Disposition: A | Discharge status: Home / Self Care | Payer: Self-pay | Source: Ambulatory Visit | Attending: Cardiovascular Disease | Admitting: Cardiovascular Disease

## 2017-11-04 ENCOUNTER — Encounter (HOSPITAL_COMMUNITY)
Admission: RE | Admit: 2017-11-04 | Discharge: 2017-11-04 | Disposition: A | Discharge status: Home / Self Care | Payer: Medicare Other | Source: Ambulatory Visit | Attending: Cardiovascular Disease | Admitting: Cardiovascular Disease

## 2017-11-06 ENCOUNTER — Encounter (HOSPITAL_COMMUNITY): Payer: Self-pay

## 2017-11-11 ENCOUNTER — Encounter (HOSPITAL_COMMUNITY)
Admission: RE | Admit: 2017-11-11 | Discharge: 2017-11-11 | Disposition: A | Discharge status: Home / Self Care | Payer: Medicare Other | Source: Ambulatory Visit | Attending: Cardiovascular Disease | Admitting: Cardiovascular Disease

## 2017-11-13 ENCOUNTER — Encounter (HOSPITAL_COMMUNITY)
Admission: RE | Admit: 2017-11-13 | Discharge: 2017-11-13 | Disposition: A | Discharge status: Home / Self Care | Payer: Medicare Other | Source: Ambulatory Visit | Attending: Cardiovascular Disease | Admitting: Cardiovascular Disease

## 2017-11-18 ENCOUNTER — Encounter (HOSPITAL_COMMUNITY): Payer: Self-pay

## 2017-11-18 DIAGNOSIS — Z951 Presence of aortocoronary bypass graft: Secondary | ICD-10-CM | POA: Insufficient documentation

## 2017-11-20 ENCOUNTER — Encounter (HOSPITAL_COMMUNITY)
Admission: RE | Admit: 2017-11-20 | Discharge: 2017-11-20 | Disposition: A | Discharge status: Home / Self Care | Payer: Self-pay | Source: Ambulatory Visit | Attending: Cardiovascular Disease | Admitting: Cardiovascular Disease

## 2017-11-25 ENCOUNTER — Encounter (HOSPITAL_COMMUNITY)
Admission: RE | Admit: 2017-11-25 | Discharge: 2017-11-25 | Disposition: A | Discharge status: Home / Self Care | Payer: Self-pay | Source: Ambulatory Visit | Attending: Cardiovascular Disease | Admitting: Cardiovascular Disease

## 2017-11-27 ENCOUNTER — Encounter (HOSPITAL_COMMUNITY): Payer: Self-pay

## 2017-12-02 ENCOUNTER — Encounter (HOSPITAL_COMMUNITY)
Admission: RE | Admit: 2017-12-02 | Discharge: 2017-12-02 | Disposition: A | Discharge status: Home / Self Care | Payer: Medicare Other | Source: Ambulatory Visit | Attending: Cardiovascular Disease | Admitting: Cardiovascular Disease

## 2017-12-04 ENCOUNTER — Encounter (HOSPITAL_COMMUNITY)
Admission: RE | Admit: 2017-12-04 | Discharge: 2017-12-04 | Disposition: A | Discharge status: Home / Self Care | Payer: Medicare Other | Source: Ambulatory Visit | Attending: Cardiovascular Disease | Admitting: Cardiovascular Disease

## 2017-12-09 ENCOUNTER — Encounter (HOSPITAL_COMMUNITY)
Admission: RE | Admit: 2017-12-09 | Discharge: 2017-12-09 | Disposition: A | Discharge status: Home / Self Care | Payer: Self-pay | Source: Ambulatory Visit | Attending: Cardiovascular Disease | Admitting: Cardiovascular Disease

## 2017-12-11 ENCOUNTER — Encounter (HOSPITAL_COMMUNITY)
Admission: RE | Admit: 2017-12-11 | Discharge: 2017-12-11 | Disposition: A | Discharge status: Home / Self Care | Payer: Self-pay | Source: Ambulatory Visit | Attending: Cardiovascular Disease | Admitting: Cardiovascular Disease

## 2017-12-16 ENCOUNTER — Encounter (HOSPITAL_COMMUNITY)
Admission: RE | Admit: 2017-12-16 | Discharge: 2017-12-16 | Disposition: A | Discharge status: Home / Self Care | Payer: Medicare Other | Source: Ambulatory Visit | Attending: Cardiovascular Disease | Admitting: Cardiovascular Disease

## 2017-12-16 DIAGNOSIS — Z951 Presence of aortocoronary bypass graft: Secondary | ICD-10-CM | POA: Insufficient documentation

## 2017-12-18 ENCOUNTER — Encounter (HOSPITAL_COMMUNITY)
Admission: RE | Admit: 2017-12-18 | Discharge: 2017-12-18 | Disposition: A | Discharge status: Home / Self Care | Payer: Self-pay | Source: Ambulatory Visit | Attending: Cardiovascular Disease | Admitting: Cardiovascular Disease

## 2017-12-23 ENCOUNTER — Encounter (HOSPITAL_COMMUNITY)
Admission: RE | Admit: 2017-12-23 | Discharge: 2017-12-23 | Disposition: A | Discharge status: Home / Self Care | Payer: Self-pay | Source: Ambulatory Visit | Attending: Cardiovascular Disease | Admitting: Cardiovascular Disease

## 2017-12-25 ENCOUNTER — Encounter (HOSPITAL_COMMUNITY): Payer: Self-pay

## 2017-12-30 ENCOUNTER — Encounter (HOSPITAL_COMMUNITY)
Admission: RE | Admit: 2017-12-30 | Discharge: 2017-12-30 | Disposition: A | Discharge status: Home / Self Care | Payer: Medicare Other | Source: Ambulatory Visit | Attending: Cardiovascular Disease | Admitting: Cardiovascular Disease

## 2018-01-01 ENCOUNTER — Encounter (HOSPITAL_COMMUNITY): Payer: Self-pay

## 2018-01-06 ENCOUNTER — Ambulatory Visit (INDEPENDENT_AMBULATORY_CARE_PROVIDER_SITE_OTHER): Payer: Medicare Other | Admitting: Physician Assistant

## 2018-01-06 ENCOUNTER — Encounter: Payer: Self-pay | Admitting: Physician Assistant

## 2018-01-06 ENCOUNTER — Other Ambulatory Visit: Payer: Medicare Other

## 2018-01-06 ENCOUNTER — Encounter (HOSPITAL_COMMUNITY)
Admission: RE | Admit: 2018-01-06 | Discharge: 2018-01-06 | Disposition: A | Discharge status: Home / Self Care | Payer: Self-pay | Source: Ambulatory Visit | Attending: Cardiovascular Disease | Admitting: Cardiovascular Disease

## 2018-01-06 VITALS — BP 116/60 | HR 60 | Ht 68.0 in | Wt 191.4 lb

## 2018-01-06 DIAGNOSIS — E782 Mixed hyperlipidemia: Secondary | ICD-10-CM | POA: Diagnosis not present

## 2018-01-06 DIAGNOSIS — I2510 Atherosclerotic heart disease of native coronary artery without angina pectoris: Secondary | ICD-10-CM

## 2018-01-06 DIAGNOSIS — R7309 Other abnormal glucose: Secondary | ICD-10-CM

## 2018-01-06 NOTE — Progress Notes (Signed)
Cardiology Office Note:    Date:  01/06/2018   ID:  William Hernandez, DOB 1945-10-18, MRN [id number]  PCP:  Kaleen Mask, MD  Cardiologist:  Kristeen Miss, MD  Electrophysiologist:  None   Referring MD: Kaleen Mask, *   Chief Complaint  Patient presents with  . Follow-up    CAD    History of Present Illness:    William Hernandez is a 72 y.o. male with coronary artery disease status post CABG in 2003 postoperative atrial fibrillation, hyperlipidemia.  Cardiac catheterization in 2017 demonstrated an occluded SVG-D2.  All other grafts were patent.  He was last seen by Dr. Elease Hernandez in April 2019.    William Hernandez returns for follow-up.  He continues to go to cardiac rehabilitation and play competitive basketball.  He denies any chest discomfort or shortness of breath.  Prior CV studies:   The following studies were reviewed today:  Cardiac catheterization 08/04/2015 LAD proximal 90, mid 100 LCx proximal 100 RCA proximal 99  SVG-OM1/OM2 patent SVG-D1 patent SVG-D2 100 LIMA-LAD patent  RIMA-PDA patent  Nuclear stress test 08/02/2015 EF 45, no ischemia, low risk  Past Medical History:  Diagnosis Date  . Arrhythmia    afib  . Coronary artery disease    2003  . Depression   . Hyperlipidemia   . S/P hernia surgery    Surgical Hx: The patient  has a past surgical history that includes Hernia repair; Cholecystectomy; Cardiac catheterization; Coronary artery bypass graft (2000); and Cardiac catheterization (N/A, 08/04/2015).   Current Medications: Current Meds  Medication Sig  . aspirin 81 MG tablet Take 81 mg by mouth at bedtime.   Marland Kitchen atorvastatin (LIPITOR) 20 MG tablet TAKE 1 TABLET BY MOUTH ONCE DAILY  . BYSTOLIC 5 MG tablet TAKE 1 TABLET (5 MG TOTAL) BY MOUTH DAILY.  . fish oil-omega-3 fatty acids 1000 MG capsule Take 1 g by mouth daily.   . Flaxseed, Linseed, (FLAX SEED OIL PO) Take 1 tablet by mouth daily.   . irbesartan (AVAPRO) 150 MG tablet Take 1  tablet (150 mg total) by mouth daily.  Marland Kitchen loratadine (CLARITIN) 10 MG tablet Take 10 mg by mouth daily.  . Multiple Vitamin (MULTIVITAMIN PO) Take 1 tablet by mouth daily.   . nitroGLYCERIN (NITROSTAT) 0.4 MG SL tablet Place 1 tablet (0.4 mg total) under the tongue every 5 (five) minutes as needed for chest pain.  Marland Kitchen omeprazole (PRILOSEC) 20 MG capsule Take 20 mg by mouth daily.  . Probiotic Product (PROBIOTIC-10 PO) Take 1 tablet by mouth daily.  . sildenafil (REVATIO) 20 MG tablet as directed. Take 1-5 tablets by mouth daily as needed  . traZODone (DESYREL) 150 MG tablet Take 200 mg by mouth at bedtime as needed for sleep.      Allergies:   Ace inhibitors; Lisinopril; and Penicillins   Social History   Tobacco Use  . Smoking status: Never Smoker  . Smokeless tobacco: Never Used  Substance Use Topics  . Alcohol use: No  . Drug use: No     Family Hx: The patient's family history includes Heart disease in his father.  ROS:   Please see the history of present illness.    ROS All other systems reviewed and are negative.   EKGs/Labs/Other Test Reviewed:    EKG:  EKG is not ordered today.    Recent Labs: 01/06/2018: ALT 24; BUN 16; Creatinine, Ser 0.94; Potassium 4.7; Sodium 140   Recent Lipid Panel Lab Results  Component Value Date/Time   CHOL 133 01/06/2018 08:05 AM   TRIG 87 01/06/2018 08:05 AM   HDL 49 01/06/2018 08:05 AM   CHOLHDL 2.7 01/06/2018 08:05 AM   CHOLHDL 2.2 11/23/2015 08:58 AM   LDLCALC 67 01/06/2018 08:05 AM    Physical Exam:    VS:  BP 116/60   Pulse 60   Ht 5\' 8"  (1.727 m)   Wt 191 lb 6.4 oz (86.8 kg)   SpO2 97%   BMI 29.10 kg/m     Wt Readings from Last 3 Encounters:  01/06/18 191 lb 6.4 oz (86.8 kg)  07/10/17 187 lb 9.6 oz (85.1 kg)  01/07/17 186 lb 3.2 oz (84.5 kg)     Physical Exam  Constitutional: He is oriented to person, place, and time. He appears well-developed and well-nourished. No distress.  HENT:  Head: Normocephalic and  atraumatic.  Eyes: No scleral icterus.  Neck: No JVD present.  Cardiovascular: Normal rate and regular rhythm.  No murmur heard. Pulmonary/Chest: Effort normal. He has no rales.  Abdominal: Soft.  Musculoskeletal: He exhibits no edema.  Neurological: He is alert and oriented to person, place, and time.  Skin: Skin is warm and dry.    ASSESSMENT & PLAN:    Coronary artery disease involving native coronary artery of native heart without angina pectoris History of CABG in 2003.  Cardiac catheterization in 2017 demonstrated an occluded vein graft to the second diagonal.  All other bypass grafts were patent.  He denies anginal symptoms.  Continue aspirin, atorvastatin, nebivolol.  Mixed hyperlipidemia LDL optimal on most recent lab work.  Continue current Rx.    Dispo:  Return in about 6 months (around 07/08/2018) for Routine Follow Up, w/ Dr. Elease Hernandez.   Medication Adjustments/Labs and Tests Ordered: Current medicines are reviewed at length with the patient today.  Concerns regarding medicines are outlined above.  Tests Ordered: No orders of the defined types were placed in this encounter.  Medication Changes: No orders of the defined types were placed in this encounter.   Signed, William Newcomer, PA-C  01/06/2018 4:33 PM    [facility] [address] [place], [place], [place]  [address] Phone: [phone]; Fax: [phone]

## 2018-01-06 NOTE — Patient Instructions (Signed)
Medication Instructions:  No changes   If you need a refill on your cardiac medications before your next appointment, please call your pharmacy.   Lab work: Someone will call you about your lab work done earlier today.  Testing/Procedures: None   Follow-Up: At [facility], you and your health needs are our priority.  As part of our continuing mission to provide you with exceptional heart care, we have created designated Provider Care Teams.  These Care Teams include your primary Cardiologist (physician) and Advanced Practice Providers (APPs -  Physician Assistants and Nurse Practitioners) who all work together to provide you with the care you need, when you need it. . Dr. Elease Hashimoto in 6 months  Any Other Special Instructions Will Be Listed Below (If Applicable).

## 2018-01-07 ENCOUNTER — Other Ambulatory Visit: Payer: Self-pay | Admitting: Cardiovascular Disease

## 2018-01-07 LAB — LIPID PANEL
CHOL/HDL RATIO: 2.7 ratio (ref 0.0–5.0)
CHOLESTEROL TOTAL: 133 mg/dL (ref 100–199)
HDL: 49 mg/dL (ref >39)
LDL CALC: 67 mg/dL (ref 0–99)
TRIGLYCERIDES: 87 mg/dL (ref 0–149)
VLDL Cholesterol Cal: 17 mg/dL (ref 5–40)

## 2018-01-07 LAB — BASIC METABOLIC PANEL
BUN/Creatinine Ratio: 17 (ref 10–24)
BUN: 16 mg/dL (ref 8–27)
CO2: 24 mmol/L (ref 20–29)
Calcium: 9.5 mg/dL (ref 8.6–10.2)
Chloride: 103 mmol/L (ref 96–106)
Creatinine, Ser: 0.94 mg/dL (ref 0.76–1.27)
GFR calc non Af Amer: 80 mL/min/{1.73_m2} (ref >59)
GFR, EST AFRICAN AMERICAN: 93 mL/min/{1.73_m2} (ref >59)
Glucose: 118 mg/dL — ABNORMAL HIGH (ref 65–99)
Potassium: 4.7 mmol/L (ref 3.5–5.2)
SODIUM: 140 mmol/L (ref 134–144)

## 2018-01-07 LAB — HEPATIC FUNCTION PANEL
ALK PHOS: 53 IU/L (ref 39–117)
ALT: 24 IU/L (ref 0–44)
AST: 18 IU/L (ref 0–40)
Albumin: 4.2 g/dL (ref 3.5–4.8)
BILIRUBIN, DIRECT: 0.28 mg/dL (ref 0.00–0.40)
Bilirubin Total: 1.2 mg/dL (ref 0.0–1.2)
Total Protein: 6.5 g/dL (ref 6.0–8.5)

## 2018-01-07 LAB — HEMOGLOBIN A1C
Est. average glucose Bld gHb Est-mCnc: 117 mg/dL
HEMOGLOBIN A1C: 5.7 % — AB (ref 4.8–5.6)

## 2018-01-08 ENCOUNTER — Encounter (HOSPITAL_COMMUNITY): Payer: Self-pay

## 2018-01-13 ENCOUNTER — Encounter (HOSPITAL_COMMUNITY)
Admission: RE | Admit: 2018-01-13 | Discharge: 2018-01-13 | Disposition: A | Discharge status: Home / Self Care | Payer: Medicare Other | Source: Ambulatory Visit | Attending: Cardiovascular Disease | Admitting: Cardiovascular Disease

## 2018-01-15 ENCOUNTER — Encounter (HOSPITAL_COMMUNITY): Payer: Self-pay

## 2018-01-20 ENCOUNTER — Encounter (HOSPITAL_COMMUNITY): Payer: Medicare Other

## 2018-01-20 DIAGNOSIS — Z951 Presence of aortocoronary bypass graft: Secondary | ICD-10-CM | POA: Diagnosis present

## 2018-01-22 ENCOUNTER — Encounter (HOSPITAL_COMMUNITY): Payer: Medicare Other

## 2018-01-27 ENCOUNTER — Encounter (HOSPITAL_COMMUNITY): Payer: Medicare Other

## 2018-01-29 ENCOUNTER — Encounter (HOSPITAL_COMMUNITY): Payer: Medicare Other

## 2018-02-02 ENCOUNTER — Other Ambulatory Visit: Payer: Self-pay | Admitting: Cardiovascular Disease

## 2018-02-03 ENCOUNTER — Encounter (HOSPITAL_COMMUNITY)
Admission: RE | Admit: 2018-02-03 | Discharge: 2018-02-03 | Disposition: A | Discharge status: Home / Self Care | Payer: Medicare Other | Source: Ambulatory Visit | Attending: Cardiovascular Disease | Admitting: Cardiovascular Disease

## 2018-02-05 ENCOUNTER — Encounter (HOSPITAL_COMMUNITY): Payer: Medicare Other

## 2018-02-10 ENCOUNTER — Encounter (HOSPITAL_COMMUNITY): Payer: Medicare Other

## 2018-02-17 ENCOUNTER — Encounter (HOSPITAL_COMMUNITY)
Admission: RE | Admit: 2018-02-17 | Discharge: 2018-02-17 | Disposition: A | Discharge status: Home / Self Care | Payer: Self-pay | Source: Ambulatory Visit | Attending: Cardiovascular Disease | Admitting: Cardiovascular Disease

## 2018-02-17 DIAGNOSIS — Z951 Presence of aortocoronary bypass graft: Secondary | ICD-10-CM | POA: Insufficient documentation

## 2018-02-19 ENCOUNTER — Encounter (HOSPITAL_COMMUNITY): Payer: Self-pay

## 2018-02-24 ENCOUNTER — Encounter (HOSPITAL_COMMUNITY)
Admission: RE | Admit: 2018-02-24 | Discharge: 2018-02-24 | Disposition: A | Discharge status: Home / Self Care | Payer: Medicare Other | Source: Ambulatory Visit | Attending: Cardiovascular Disease | Admitting: Cardiovascular Disease

## 2018-02-26 ENCOUNTER — Encounter (HOSPITAL_COMMUNITY): Payer: Self-pay

## 2018-03-03 ENCOUNTER — Encounter (HOSPITAL_COMMUNITY)
Admission: RE | Admit: 2018-03-03 | Discharge: 2018-03-03 | Disposition: A | Discharge status: Home / Self Care | Payer: Self-pay | Source: Ambulatory Visit | Attending: Cardiovascular Disease | Admitting: Cardiovascular Disease

## 2018-03-05 ENCOUNTER — Encounter (HOSPITAL_COMMUNITY): Payer: Self-pay

## 2018-03-10 ENCOUNTER — Encounter (HOSPITAL_COMMUNITY): Payer: Self-pay

## 2018-03-12 ENCOUNTER — Encounter (HOSPITAL_COMMUNITY): Payer: Self-pay

## 2018-03-17 ENCOUNTER — Encounter (HOSPITAL_COMMUNITY)
Admission: RE | Admit: 2018-03-17 | Discharge: 2018-03-17 | Disposition: A | Discharge status: Home / Self Care | Payer: Self-pay | Source: Ambulatory Visit | Attending: Cardiovascular Disease | Admitting: Cardiovascular Disease

## 2018-03-19 ENCOUNTER — Encounter (HOSPITAL_COMMUNITY)
Admission: RE | Admit: 2018-03-19 | Discharge: 2018-03-19 | Disposition: A | Discharge status: Home / Self Care | Payer: Self-pay | Source: Ambulatory Visit | Attending: Cardiovascular Disease | Admitting: Cardiovascular Disease

## 2018-03-19 DIAGNOSIS — Z951 Presence of aortocoronary bypass graft: Secondary | ICD-10-CM | POA: Insufficient documentation

## 2018-03-24 ENCOUNTER — Encounter (HOSPITAL_COMMUNITY)
Admission: RE | Admit: 2018-03-24 | Discharge: 2018-03-24 | Disposition: A | Discharge status: Home / Self Care | Payer: Self-pay | Source: Ambulatory Visit | Attending: Cardiovascular Disease | Admitting: Cardiovascular Disease

## 2018-03-26 ENCOUNTER — Encounter (HOSPITAL_COMMUNITY): Payer: Self-pay

## 2018-03-31 ENCOUNTER — Encounter (HOSPITAL_COMMUNITY): Payer: Self-pay

## 2018-04-01 ENCOUNTER — Telehealth: Payer: Self-pay | Admitting: Cardiovascular Disease

## 2018-04-01 DIAGNOSIS — Z87898 Personal history of other specified conditions: Secondary | ICD-10-CM

## 2018-04-01 DIAGNOSIS — I2510 Atherosclerotic heart disease of native coronary artery without angina pectoris: Secondary | ICD-10-CM

## 2018-04-01 DIAGNOSIS — R972 Elevated prostate specific antigen [PSA]: Secondary | ICD-10-CM

## 2018-04-01 DIAGNOSIS — E782 Mixed hyperlipidemia: Secondary | ICD-10-CM

## 2018-04-01 NOTE — Telephone Encounter (Signed)
Patient would like lab orders (PSA, lipid panal, and and other labs needed) prior to his 4/27 appt with Dr. Elease Hashimoto.

## 2018-04-02 ENCOUNTER — Encounter (HOSPITAL_COMMUNITY)
Admission: RE | Admit: 2018-04-02 | Discharge: 2018-04-02 | Disposition: A | Discharge status: Home / Self Care | Payer: Self-pay | Source: Ambulatory Visit | Attending: Cardiovascular Disease | Admitting: Cardiovascular Disease

## 2018-04-07 ENCOUNTER — Encounter (HOSPITAL_COMMUNITY)
Admission: RE | Admit: 2018-04-07 | Discharge: 2018-04-07 | Disposition: A | Discharge status: Home / Self Care | Payer: Self-pay | Source: Ambulatory Visit | Attending: Cardiovascular Disease | Admitting: Cardiovascular Disease

## 2018-04-09 ENCOUNTER — Encounter (HOSPITAL_COMMUNITY): Payer: Self-pay

## 2018-04-14 ENCOUNTER — Encounter (HOSPITAL_COMMUNITY): Payer: Self-pay

## 2018-04-15 NOTE — Telephone Encounter (Signed)
Lab appointment scheduled for 4/23 and message sent to patient to call back to reschedule if needed. Lab orders in epic.

## 2018-04-16 ENCOUNTER — Encounter (HOSPITAL_COMMUNITY): Payer: Self-pay

## 2018-04-21 ENCOUNTER — Encounter (HOSPITAL_COMMUNITY)
Admission: RE | Admit: 2018-04-21 | Discharge: 2018-04-21 | Disposition: A | Discharge status: Home / Self Care | Payer: Medicare Other | Source: Ambulatory Visit | Attending: Cardiovascular Disease | Admitting: Cardiovascular Disease

## 2018-04-21 DIAGNOSIS — Z951 Presence of aortocoronary bypass graft: Secondary | ICD-10-CM | POA: Insufficient documentation

## 2018-04-23 ENCOUNTER — Encounter (HOSPITAL_COMMUNITY): Payer: Self-pay

## 2018-04-28 ENCOUNTER — Encounter (HOSPITAL_COMMUNITY)
Admission: RE | Admit: 2018-04-28 | Discharge: 2018-04-28 | Disposition: A | Discharge status: Home / Self Care | Payer: Medicare Other | Source: Ambulatory Visit | Attending: Cardiovascular Disease | Admitting: Cardiovascular Disease

## 2018-04-30 ENCOUNTER — Encounter (HOSPITAL_COMMUNITY)
Admission: RE | Admit: 2018-04-30 | Discharge: 2018-04-30 | Disposition: A | Discharge status: Home / Self Care | Payer: Self-pay | Source: Ambulatory Visit | Attending: Cardiovascular Disease | Admitting: Cardiovascular Disease

## 2018-05-05 ENCOUNTER — Encounter (HOSPITAL_COMMUNITY)
Admission: RE | Admit: 2018-05-05 | Discharge: 2018-05-05 | Disposition: A | Discharge status: Home / Self Care | Payer: Medicare Other | Source: Ambulatory Visit | Attending: Cardiovascular Disease | Admitting: Cardiovascular Disease

## 2018-05-07 ENCOUNTER — Encounter (HOSPITAL_COMMUNITY)
Admission: RE | Admit: 2018-05-07 | Discharge: 2018-05-07 | Disposition: A | Discharge status: Home / Self Care | Payer: Self-pay | Source: Ambulatory Visit | Attending: Cardiovascular Disease | Admitting: Cardiovascular Disease

## 2018-05-12 ENCOUNTER — Encounter (HOSPITAL_COMMUNITY)
Admission: RE | Admit: 2018-05-12 | Discharge: 2018-05-12 | Disposition: A | Discharge status: Home / Self Care | Payer: Self-pay | Source: Ambulatory Visit | Attending: Cardiovascular Disease | Admitting: Cardiovascular Disease

## 2018-05-14 ENCOUNTER — Encounter (HOSPITAL_COMMUNITY): Payer: Self-pay

## 2018-05-19 ENCOUNTER — Encounter (HOSPITAL_COMMUNITY)
Admission: RE | Admit: 2018-05-19 | Discharge: 2018-05-19 | Disposition: A | Discharge status: Home / Self Care | Payer: Self-pay | Source: Ambulatory Visit | Attending: Cardiovascular Disease | Admitting: Cardiovascular Disease

## 2018-05-19 DIAGNOSIS — Z951 Presence of aortocoronary bypass graft: Secondary | ICD-10-CM | POA: Insufficient documentation

## 2018-05-21 ENCOUNTER — Encounter (HOSPITAL_COMMUNITY): Payer: Self-pay

## 2018-05-26 ENCOUNTER — Encounter (HOSPITAL_COMMUNITY)
Admission: RE | Admit: 2018-05-26 | Discharge: 2018-05-26 | Disposition: A | Discharge status: Home / Self Care | Payer: Medicare Other | Source: Ambulatory Visit | Attending: Cardiovascular Disease | Admitting: Cardiovascular Disease

## 2018-05-28 ENCOUNTER — Encounter (HOSPITAL_COMMUNITY): Payer: Self-pay

## 2018-06-01 ENCOUNTER — Telehealth (HOSPITAL_COMMUNITY): Payer: Self-pay | Admitting: *Deleted

## 2018-06-01 NOTE — Telephone Encounter (Signed)
Contacted patient to notify of Cardiac Rehab department closure x2 weeks. Message left on patient's voicemail. Kerington Hildebrant, Exercise Physiologist Cardiac and Pulmonary Rehabilitaiton 

## 2018-06-02 ENCOUNTER — Encounter (HOSPITAL_COMMUNITY): Payer: Self-pay

## 2018-06-04 ENCOUNTER — Encounter (HOSPITAL_COMMUNITY): Payer: Self-pay

## 2018-06-09 ENCOUNTER — Telehealth (HOSPITAL_COMMUNITY): Payer: Self-pay | Admitting: Family Medicine

## 2018-06-09 ENCOUNTER — Encounter (HOSPITAL_COMMUNITY): Payer: Self-pay

## 2018-06-11 ENCOUNTER — Encounter (HOSPITAL_COMMUNITY): Payer: Self-pay

## 2018-06-16 ENCOUNTER — Encounter (HOSPITAL_COMMUNITY): Payer: Self-pay

## 2018-06-18 ENCOUNTER — Encounter (HOSPITAL_COMMUNITY): Payer: Self-pay

## 2018-06-23 ENCOUNTER — Encounter (HOSPITAL_COMMUNITY): Payer: Self-pay

## 2018-06-24 ENCOUNTER — Telehealth (HOSPITAL_COMMUNITY): Payer: Self-pay | Admitting: *Deleted

## 2018-06-24 NOTE — Telephone Encounter (Signed)
Called to notify patient that the cardiac and pulmonary rehabilitation department will be closed temporarily due to COVID-19 restrictions. Pt verbalized understanding. Pt currently having some stomach pain, advised to call EMS if need  Artist Pais, MS, ACSM CEP 06/24/2018 1611

## 2018-06-25 ENCOUNTER — Encounter (HOSPITAL_COMMUNITY): Payer: Self-pay

## 2018-06-26 ENCOUNTER — Telehealth: Payer: Self-pay

## 2018-06-26 NOTE — Telephone Encounter (Signed)
Pt gave consent to telephone visit with Dr. Elease HashimotoNahser on 07/13/2018 at 2:20pm. Pt has been advised to have BP, HR, and weight ready for appt.   YOUR CARDIOLOGY TEAM HAS ARRANGED FOR AN E-VISIT FOR YOUR APPOINTMENT - PLEASE REVIEW IMPORTANT INFORMATION BELOW SEVERAL DAYS PRIOR TO YOUR APPOINTMENT  Due to the recent COVID-19 pandemic, we are transitioning in-person office visits to tele-medicine visits in an effort to decrease unnecessary exposure to our patients and staff. Medicare and most insurances are covering these visits without a copay needed. You will need a working email and a smartphone or computer with a camera and microphone. For patients that do not have these items, we can still complete the visit using a telephone but do prefer video when possible. If possible, we also ask that you have a blood pressure cuff and scale at home to measure your blood pressure, heart rate and weight prior to your scheduled appointment. Patients with clinical needs that need an in-person evaluation and testing will still be able to come to the office if absolutely necessary. If you have any questions, feel free to call our office.     DOWNLOADING THE SOFTWARE  Download the [employer] WebEx app to enable video and telephone visits with your [facility] Provider.   Instructions for downloading [employer] WebEx: - Go to [email or web address] and follow the instructions, or download the app on your smartphone [facility]([employer] Meetings). - If you have technical difficulties with downloading WebEx, please call WebEx at [phone]. - Once the app is downloaded (can be done on either mobile or desktop computer), go to Settings in the upper left hand corner.  Be sure that camera and audio are enabled.  - You will receive an email message with a link to the meeting with a time to join for your tele-health visit.  - Please download the app and have settings configured prior to the appointment time.      2-3  DAYS BEFORE YOUR APPOINTMENT  One of our staff will call you to confirm that you have been able to set up your WebEx account. We will remind you check your blood pressure, heart rate and weight prior to your scheduled appointment. If you have an Apple Watch or Kardia, please upload any pertinent ECG strips the day before or morning of your appointment to MyChart. Our staff will also make sure you have reviewed the consent and agree to move forward with your scheduled tele-health visit.    THE DAY OF YOUR APPOINTMENT  Approximately 15-20 minutes prior to your scheduled appointment, you will receive an e-mail directly from one of our staff member's [email or web address] e-mail accounts inviting you to join a WebEx meeting.  Please do not reply to that email - simply join the [employer] meeting.  Upon joining, a member of the office staff will speak with you initially through the WebEx platform to confirm medications, vital signs for the day and any symptoms you may be experiencing.  Please have this information available prior to the time of visit start.      CONSENT FOR TELE-HEALTH VISIT - PLEASE RVIEW  I hereby voluntarily request, consent and authorize [facility] and its employed or contracted physicians, physician assistants, nurse practitioners or other licensed health care professionals (the Practitioner), to provide me with telemedicine health care services (the "Services") as deemed necessary by the treating Practitioner. I acknowledge and consent to receive the Services by the Practitioner via telemedicine. I understand that the telemedicine visit will involve communicating  with the Practitioner through live audiovisual communication technology and the disclosure of certain medical information by electronic transmission. I acknowledge that I have been given the opportunity to request an in-person assessment or other available alternative prior to the telemedicine visit and am voluntarily participating  in the telemedicine visit.  I understand that I have the right to withhold or withdraw my consent to the use of telemedicine in the course of my care at any time, without affecting my right to future care or treatment, and that the Practitioner or I may terminate the telemedicine visit at any time. I understand that I have the right to inspect all information obtained and/or recorded in the course of the telemedicine visit and may receive copies of available information for a reasonable fee.  I understand that some of the potential risks of receiving the Services via telemedicine include:  Marland Kitchen Delay or interruption in medical evaluation due to technological equipment failure or disruption; . Information transmitted may not be sufficient (e.g. poor resolution of images) to allow for appropriate medical decision making by the Practitioner; and/or  . In rare instances, security protocols could fail, causing a breach of personal health information.  Furthermore, I acknowledge that it is my responsibility to provide information about my medical history, conditions and care that is complete and accurate to the best of my ability. I acknowledge that Practitioner's advice, recommendations, and/or decision may be based on factors not within their control, such as incomplete or inaccurate data provided by me or distortions of diagnostic images or specimens that may result from electronic transmissions. I understand that the practice of medicine is not an exact science and that Practitioner makes no warranties or guarantees regarding treatment outcomes. I acknowledge that I will receive a copy of this consent concurrently upon execution via email to the email address I last provided but may also request a printed copy by calling the office of [facility].    I understand that my insurance will be billed for this visit.   I have read or had this consent read to me. . I understand the contents of this consent, which  adequately explains the benefits and risks of the Services being provided via telemedicine.  . I have been provided ample opportunity to ask questions regarding this consent and the Services and have had my questions answered to my satisfaction. . I give my informed consent for the services to be provided through the use of telemedicine in my medical care  By participating in this telemedicine visit I agree to the above.

## 2018-06-27 ENCOUNTER — Emergency Department (HOSPITAL_COMMUNITY): Payer: Medicare Other

## 2018-06-27 ENCOUNTER — Emergency Department (HOSPITAL_COMMUNITY)
Admission: EM | Admit: 2018-06-27 | Discharge: 2018-06-27 | Disposition: A | Discharge status: Home / Self Care | Payer: Medicare Other | Attending: Emergency Medicine | Admitting: Emergency Medicine

## 2018-06-27 ENCOUNTER — Other Ambulatory Visit: Payer: Self-pay

## 2018-06-27 ENCOUNTER — Encounter (HOSPITAL_COMMUNITY): Payer: Self-pay | Admitting: Emergency Medicine

## 2018-06-27 DIAGNOSIS — N200 Calculus of kidney: Secondary | ICD-10-CM | POA: Insufficient documentation

## 2018-06-27 DIAGNOSIS — F329 Major depressive disorder, single episode, unspecified: Secondary | ICD-10-CM | POA: Diagnosis not present

## 2018-06-27 DIAGNOSIS — I10 Essential (primary) hypertension: Secondary | ICD-10-CM | POA: Insufficient documentation

## 2018-06-27 DIAGNOSIS — K862 Cyst of pancreas: Secondary | ICD-10-CM | POA: Diagnosis not present

## 2018-06-27 DIAGNOSIS — Z951 Presence of aortocoronary bypass graft: Secondary | ICD-10-CM | POA: Diagnosis not present

## 2018-06-27 DIAGNOSIS — I2510 Atherosclerotic heart disease of native coronary artery without angina pectoris: Secondary | ICD-10-CM | POA: Insufficient documentation

## 2018-06-27 DIAGNOSIS — Z9049 Acquired absence of other specified parts of digestive tract: Secondary | ICD-10-CM | POA: Insufficient documentation

## 2018-06-27 DIAGNOSIS — R109 Unspecified abdominal pain: Secondary | ICD-10-CM | POA: Diagnosis present

## 2018-06-27 LAB — COMPREHENSIVE METABOLIC PANEL
ALT: 40 U/L (ref 0–44)
AST: 48 U/L — ABNORMAL HIGH (ref 15–41)
Albumin: 4.7 g/dL (ref 3.5–5.0)
Alkaline Phosphatase: 50 U/L (ref 38–126)
Anion gap: 13 (ref 5–15)
BUN: 18 mg/dL (ref 8–23)
CO2: 22 mmol/L (ref 22–32)
Calcium: 9.2 mg/dL (ref 8.9–10.3)
Chloride: 100 mmol/L (ref 98–111)
Creatinine, Ser: 1.21 mg/dL (ref 0.61–1.24)
GFR calc Af Amer: >60 mL/min (ref >60)
GFR calc non Af Amer: 59 mL/min — ABNORMAL LOW (ref >60)
Glucose, Bld: 107 mg/dL — ABNORMAL HIGH (ref 70–99)
Potassium: 4.1 mmol/L (ref 3.5–5.1)
Sodium: 135 mmol/L (ref 135–145)
Total Bilirubin: 2.1 mg/dL — ABNORMAL HIGH (ref 0.3–1.2)
Total Protein: 7.7 g/dL (ref 6.5–8.1)

## 2018-06-27 LAB — CBC WITH DIFFERENTIAL/PLATELET
Abs Immature Granulocytes: 0.05 10*3/uL (ref 0.00–0.07)
Basophils Absolute: 0.1 10*3/uL (ref 0.0–0.1)
Basophils Relative: 1 %
Eosinophils Absolute: 0.2 10*3/uL (ref 0.0–0.5)
Eosinophils Relative: 1 %
HCT: 48 % (ref 39.0–52.0)
Hemoglobin: 16.3 g/dL (ref 13.0–17.0)
Immature Granulocytes: 0 %
Lymphocytes Relative: 19 %
Lymphs Abs: 2.8 10*3/uL (ref 0.7–4.0)
MCH: 33 pg (ref 26.0–34.0)
MCHC: 34 g/dL (ref 30.0–36.0)
MCV: 97.2 fL (ref 80.0–100.0)
Monocytes Absolute: 1.5 10*3/uL — ABNORMAL HIGH (ref 0.1–1.0)
Monocytes Relative: 10 %
Neutro Abs: 10.2 10*3/uL — ABNORMAL HIGH (ref 1.7–7.7)
Neutrophils Relative %: 69 %
Platelets: 205 10*3/uL (ref 150–400)
RBC: 4.94 MIL/uL (ref 4.22–5.81)
RDW: 11.8 % (ref 11.5–15.5)
WBC: 14.7 10*3/uL — ABNORMAL HIGH (ref 4.0–10.5)
nRBC: 0 % (ref 0.0–0.2)

## 2018-06-27 LAB — URINALYSIS, ROUTINE W REFLEX MICROSCOPIC
Bilirubin Urine: NEGATIVE
Glucose, UA: NEGATIVE mg/dL
Hgb urine dipstick: NEGATIVE
Ketones, ur: 5 mg/dL — AB
Leukocytes,Ua: NEGATIVE
Nitrite: NEGATIVE
Protein, ur: NEGATIVE mg/dL
Specific Gravity, Urine: 1.027 (ref 1.005–1.030)
pH: 5 (ref 5.0–8.0)

## 2018-06-27 LAB — LIPASE, BLOOD: Lipase: 27 U/L (ref 11–51)

## 2018-06-27 IMAGING — CT CT ABDOMEN AND PELVIS WITH CONTRAST
2 of 5 series · 17 of 46 positions shown, 19 images · IV contrast (ISOVUE)
Comparison: None.

CLINICAL DATA: Acute left-sided abdominal pain.

EXAM:
CT ABDOMEN AND PELVIS WITH CONTRAST
TECHNIQUE: Multidetector CT imaging of the abdomen and pelvis was performed
using the standard protocol following bolus administration of
intravenous contrast.
CONTRAST:  100mL OMNIPAQUE IOHEXOL 300 MG/ML  SOLN

[Series 2: axial st · axial · 0.92mm/px · z∈[-140,+260]mm · 14 of 94 slices shown, 16 images]
[im 7/94  soft-tissue]
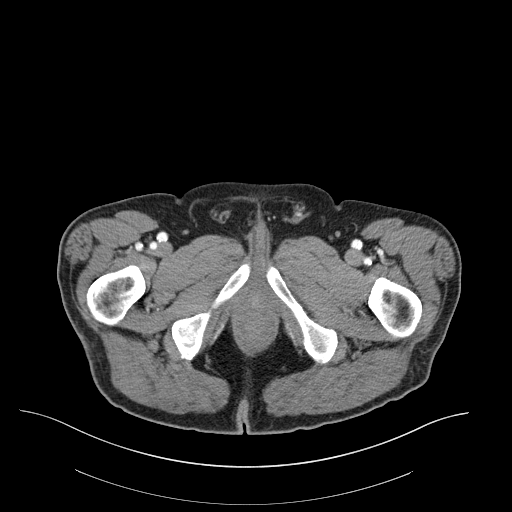
[im 7/94  bone]
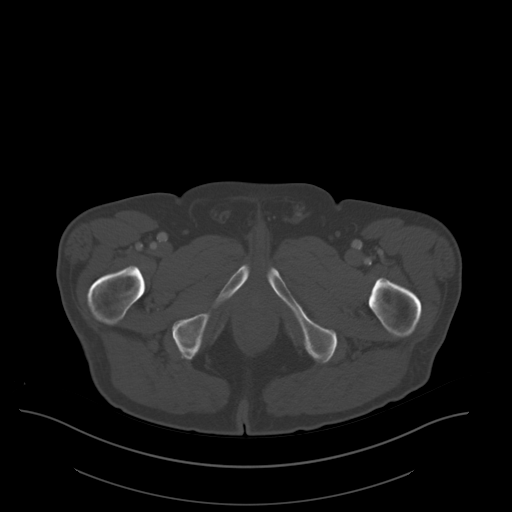
[im 13/94  soft-tissue]
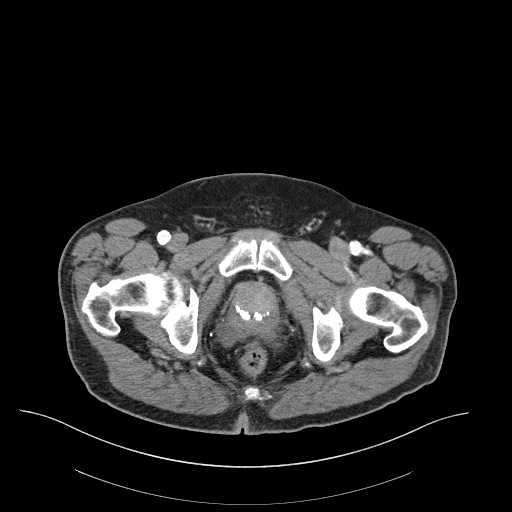
[im 19/94  soft-tissue]
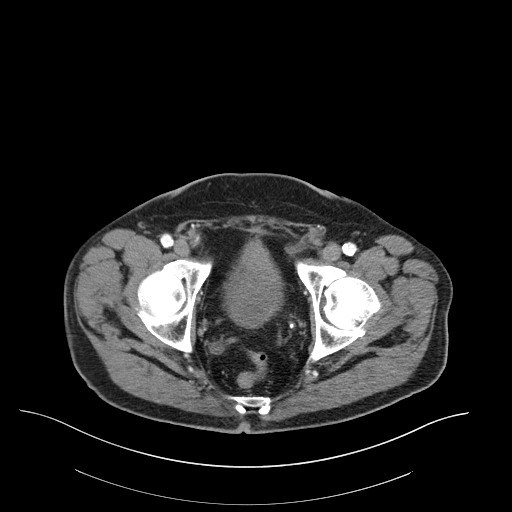
[im 25/94  soft-tissue]
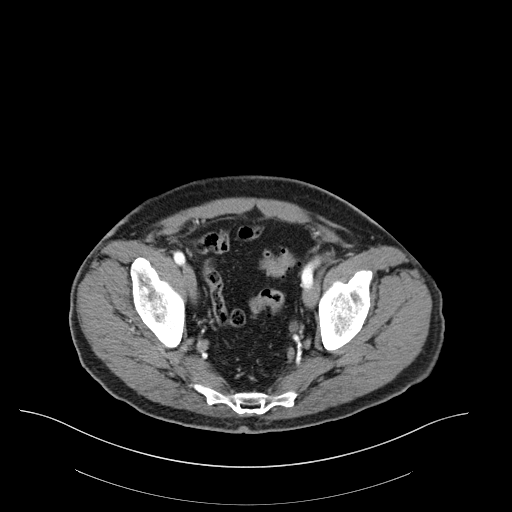
[im 32/94  soft-tissue]
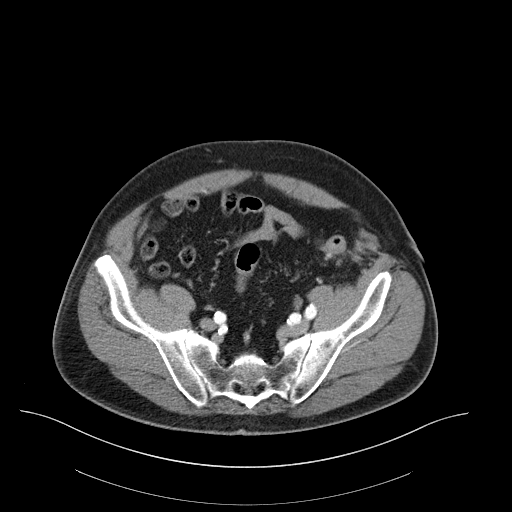
[im 38/94  soft-tissue]
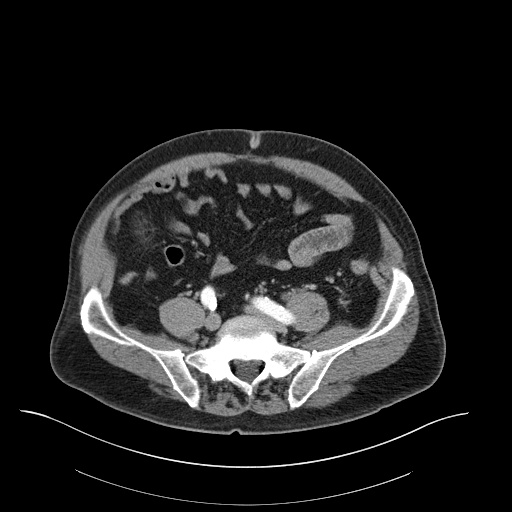
[im 44/94  soft-tissue]
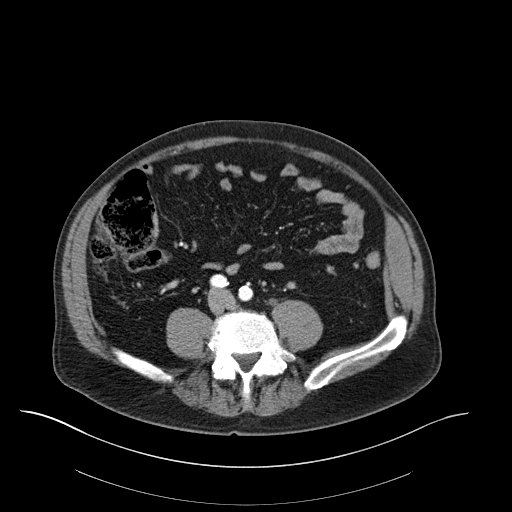
[im 50/94  soft-tissue]
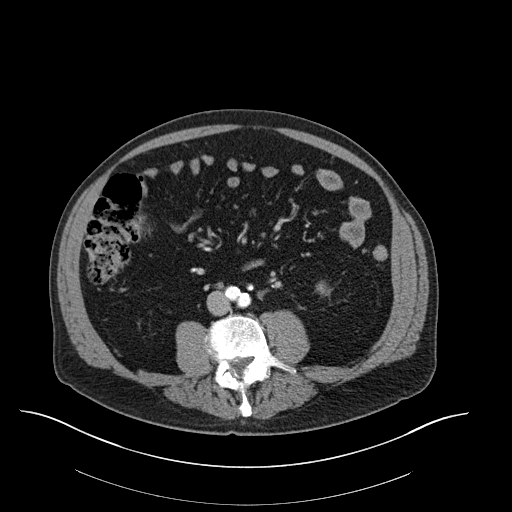
[im 56/94  soft-tissue]
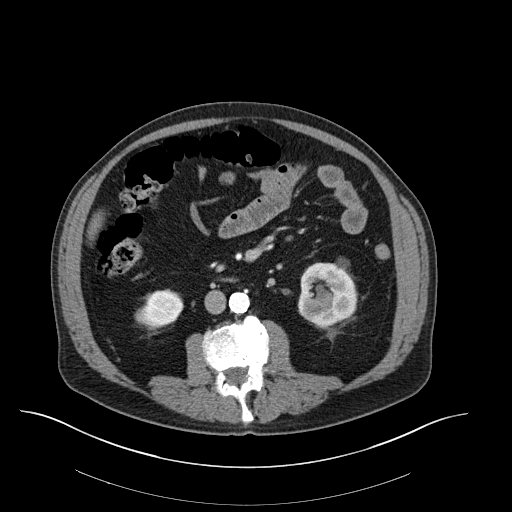
[im 56/94  bone]
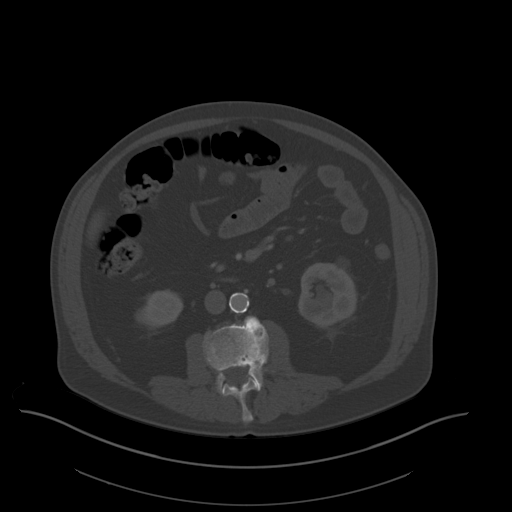
[im 63/94  soft-tissue]
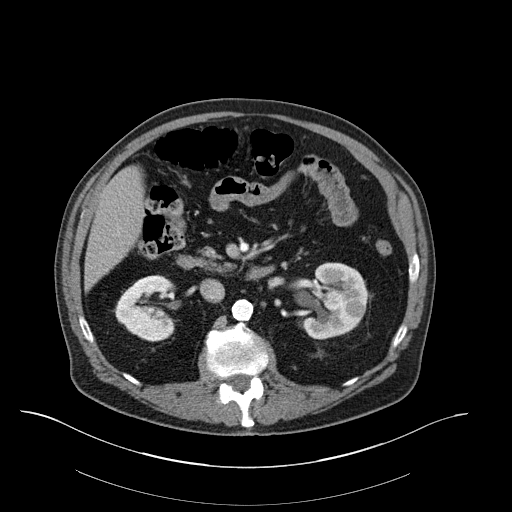
[im 69/94  soft-tissue]
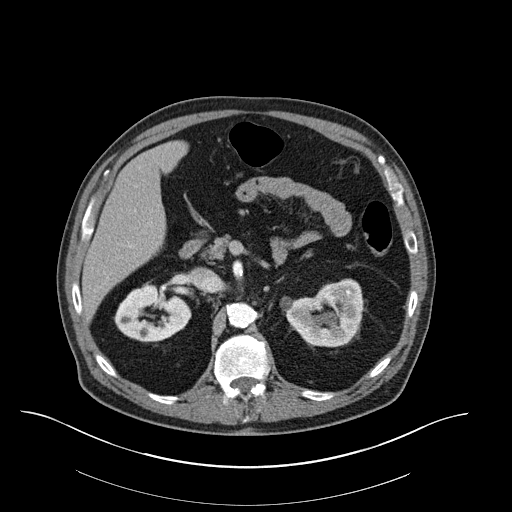
[im 75/94  soft-tissue]
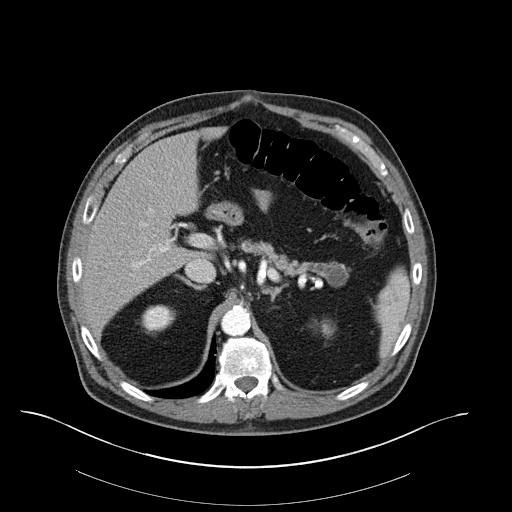
[im 81/94  soft-tissue]
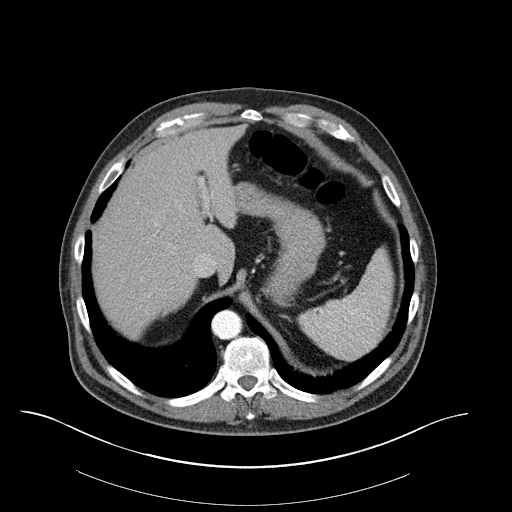
[im 87/94  soft-tissue]
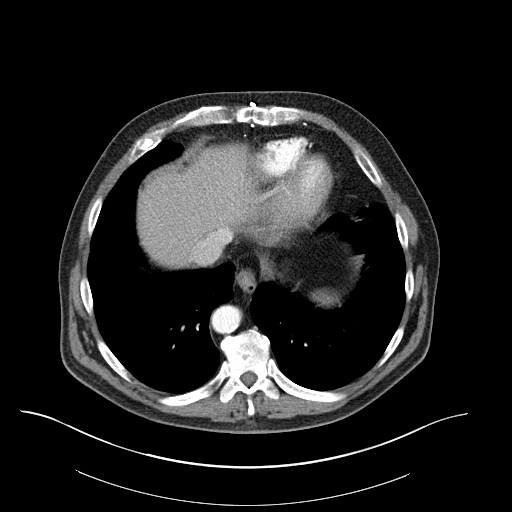

[Series 4: coronal st · coronal · 0.89mm/px · 3 of 177 slices shown]
[im 59/177  soft-tissue]
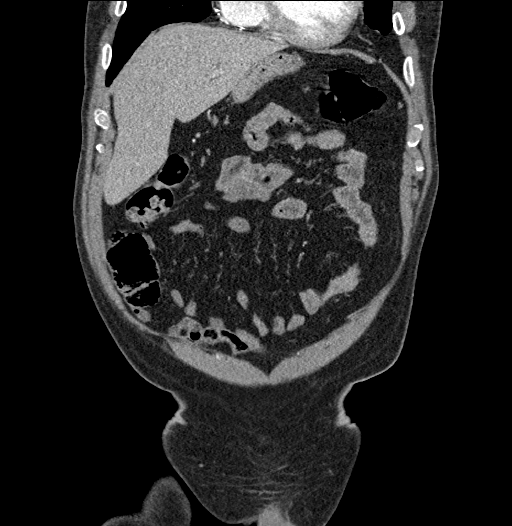
[im 79/177  soft-tissue]
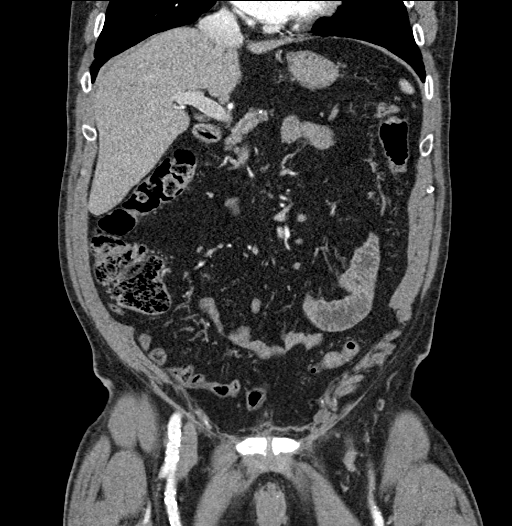
[im 98/177  soft-tissue]
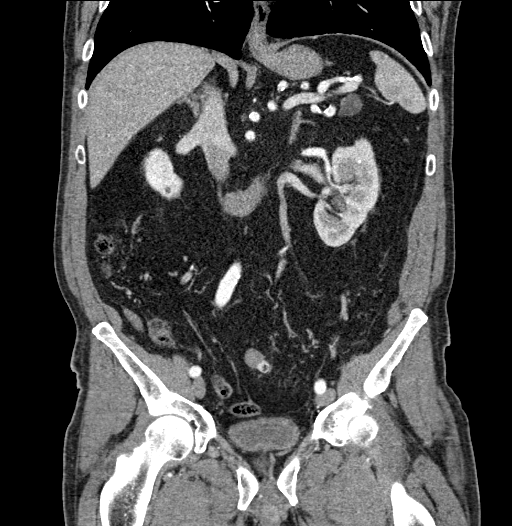

[17 of 46 positions shown; findings below may reference images not displayed]

FINDINGS: Lower chest: No acute abnormality.

Hepatobiliary: No focal liver abnormality is seen. Status post
cholecystectomy. No biliary dilatation.

Pancreas: Cystic abnormality measuring 2.8 x 1.6 cm is noted in the
pancreatic tail. No pancreatic inflammation or ductal dilatation is
noted.

Spleen: Normal in size without focal abnormality.

Adrenals/Urinary Tract: Adrenal glands appear normal. Bilateral
renal cysts are noted. Mild left hydronephrosis is noted secondary
to 6 mm calculus in distal left ureter; also noted is 8 mm calculus
at left ureterovesical junction.

Stomach/Bowel: Stomach is within normal limits. Appendix appears
normal. No evidence of bowel wall thickening, distention, or
inflammatory changes. Sigmoid diverticulosis is noted without
inflammation.

Vascular/Lymphatic: Aortic atherosclerosis. No enlarged abdominal or
pelvic lymph nodes.

Reproductive: Mild prostatic enlargement is noted with associated
calcifications.

Other: No abdominal wall hernia or abnormality. No abdominopelvic
ascites.

Musculoskeletal: No acute or significant osseous findings.
IMPRESSION: Mild left hydroureteronephrosis is noted secondary to 6 mm calculus
in distal left ureter as well as 8 mm calculus at left
ureterovesical junction.

2.8 cm cystic abnormality seen in pancreatic tail. Follow-up CT scan
or MRI in 6 months is recommended to ensure stability and rule out
neoplasm.

Sigmoid diverticulosis without inflammation.

Mild prostatic enlargement.

Aortic Atherosclerosis ([9M]-[9M]).

## 2018-06-27 MED ORDER — FENTANYL CITRATE (PF) 100 MCG/2ML IJ SOLN
50.0000 ug | Freq: Once | INTRAMUSCULAR | Status: AC
  Administered 2018-06-27: 50 ug via INTRAVENOUS
  Filled 2018-06-27: qty 2

## 2018-06-27 MED ORDER — SODIUM CHLORIDE (PF) 0.9 % IJ SOLN
INTRAMUSCULAR | Status: AC
  Filled 2018-06-27: qty 50

## 2018-06-27 MED ORDER — TAMSULOSIN HCL 0.4 MG PO CAPS: 0.4000 mg | ORAL_CAPSULE | Freq: Every day | ORAL | 0 refills | Status: AC

## 2018-06-27 MED ORDER — IOHEXOL 300 MG/ML  SOLN
100.0000 mL | Freq: Once | INTRAMUSCULAR | Status: AC | PRN
  Administered 2018-06-27: 100 mL via INTRAVENOUS

## 2018-06-27 MED ORDER — OXYCODONE-ACETAMINOPHEN 5-325 MG PO TABS: 2.0000 | ORAL_TABLET | Freq: Four times a day (QID) | ORAL | 0 refills | Status: AC | PRN

## 2018-06-27 NOTE — ED Provider Notes (Signed)
Emergency Department Provider Note   I have reviewed the triage vital signs and the nursing notes.   HISTORY  Chief Complaint Abdominal Pain; Diarrhea; and Flank Pain   HPI William Hernandez is a 73 y.o. male with PMH of CAD s/p CABG, A-fib, and HLD presents to the ED for evaluation of intermittent left-sided abdominal pain with some diarrhea yesterday.  Symptoms have been ongoing for the past several days.  Is been taking ibuprofen with no relief in symptoms.  Patient states he went to his PCP who performed a rectal and abdominal exam with no clear explanation for the symptoms.  Patient denies history of diverticulitis.  He states he has a remote history of kidney stones.  No fevers or chills.  No dysuria, hesitancy, urgency.  No chest pain, cough, shortness of breath.    Past Medical History:  Diagnosis Date  . Arrhythmia    afib  . Coronary artery disease    2003  . Depression   . Hyperlipidemia   . S/P hernia surgery     Patient Active Problem List   Diagnosis Date Noted  . Essential hypertension 01/07/2017  . CAD (coronary artery disease) 07/03/2010  . Hyperlipidemia 07/03/2010  . Anxiety 07/03/2010    Past Surgical History:  Procedure Laterality Date  . CARDIAC CATHETERIZATION     11/2001  . CARDIAC CATHETERIZATION N/A 08/04/2015   Procedure: Left Heart Cath and Cors/Grafts Angiography;  Surgeon: Tonny Bollman, MD;  Location: [facility] INVASIVE CV LAB;  Service: Cardiovascular;  Laterality: N/A;  . CHOLECYSTECTOMY    . CORONARY ARTERY BYPASS GRAFT  2000  . HERNIA REPAIR      Allergies Ace inhibitors; Lisinopril; and Penicillins  Family History  Problem Relation Age of Onset  . Heart disease Father     Social History Social History   Tobacco Use  . Smoking status: Never Smoker  . Smokeless tobacco: Never Used  Substance Use Topics  . Alcohol use: No  . Drug use: No    Review of Systems  Constitutional: No fever/chills Eyes: No visual changes. ENT:  No sore throat. Cardiovascular: Denies chest pain. Respiratory: Denies shortness of breath. Gastrointestinal: Positive left sided abdominal pain. No nausea, no vomiting.  Positive diarrhea (resolved).  No constipation. Genitourinary: Negative for dysuria. Musculoskeletal: Negative for back pain. Skin: Negative for rash. Neurological: Negative for headaches, focal weakness or numbness.  10-point ROS otherwise negative.  ____________________________________________   PHYSICAL EXAM:  VITAL SIGNS: ED Triage Vitals  Enc Vitals Group     BP 06/27/18 1824 (!) 183/81     Pulse Rate 06/27/18 1824 (!) 54     Resp 06/27/18 1824 18     Temp 06/27/18 1824 98.5 F (36.9 C)     Temp Source 06/27/18 1824 Oral     SpO2 06/27/18 1824 100 %     Pain Score 06/27/18 1830 7   Constitutional: Alert and oriented. Well appearing and in no acute distress. Eyes: Conjunctivae are normal.  Head: Atraumatic. Nose: No congestion/rhinnorhea. Mouth/Throat: Mucous membranes are moist.  Neck: No stridor. Cardiovascular: Normal rate, regular rhythm. Good peripheral circulation. Grossly normal heart sounds.   Respiratory: Normal respiratory effort.  No retractions. Lungs CTAB. Gastrointestinal: Soft and nontender. No distention.  Musculoskeletal: No lower extremity tenderness nor edema. No gross deformities of extremities. Neurologic:  Normal speech and language. No gross focal neurologic deficits are appreciated.  Skin:  Skin is warm, dry and intact. No rash noted.   ____________________________________________  LABS (all labs ordered are listed, but only abnormal results are displayed)  Labs Reviewed  COMPREHENSIVE METABOLIC PANEL - Abnormal; Notable for the following components:      Result Value   Glucose, Bld 107 (*)    AST 48 (*)    Total Bilirubin 2.1 (*)    GFR calc non Af Amer 59 (*)    All other components within normal limits  CBC WITH DIFFERENTIAL/PLATELET - Abnormal; Notable for the  following components:   WBC 14.7 (*)    Neutro Abs 10.2 (*)    Monocytes Absolute 1.5 (*)    All other components within normal limits  URINALYSIS, ROUTINE W REFLEX MICROSCOPIC - Abnormal; Notable for the following components:   Ketones, ur 5 (*)    All other components within normal limits  URINE CULTURE  LIPASE, BLOOD   ____________________________________________  RADIOLOGY  Ct Abdomen Pelvis W Contrast  Result Date: 06/27/2018 CLINICAL DATA:  Acute left-sided abdominal pain. EXAM: CT ABDOMEN AND PELVIS WITH CONTRAST TECHNIQUE: Multidetector CT imaging of the abdomen and pelvis was performed using the standard protocol following bolus administration of intravenous contrast. CONTRAST:  100mL OMNIPAQUE IOHEXOL 300 MG/ML  SOLN COMPARISON:  None. FINDINGS: Lower chest: No acute abnormality. Hepatobiliary: No focal liver abnormality is seen. Status post cholecystectomy. No biliary dilatation. Pancreas: Cystic abnormality measuring 2.8 x 1.6 cm is noted in the pancreatic tail. No pancreatic inflammation or ductal dilatation is noted. Spleen: Normal in size without focal abnormality. Adrenals/Urinary Tract: Adrenal glands appear normal. Bilateral renal cysts are noted. Mild left hydronephrosis is noted secondary to 6 mm calculus in distal left ureter; also noted is 8 mm calculus at left ureterovesical junction. Stomach/Bowel: Stomach is within normal limits. Appendix appears normal. No evidence of bowel wall thickening, distention, or inflammatory changes. Sigmoid diverticulosis is noted without inflammation. Vascular/Lymphatic: Aortic atherosclerosis. No enlarged abdominal or pelvic lymph nodes. Reproductive: Mild prostatic enlargement is noted with associated calcifications. Other: No abdominal wall hernia or abnormality. No abdominopelvic ascites. Musculoskeletal: No acute or significant osseous findings. IMPRESSION: Mild left hydroureteronephrosis is noted secondary to 6 mm calculus in distal left  ureter as well as 8 mm calculus at left ureterovesical junction. 2.8 cm cystic abnormality seen in pancreatic tail. Follow-up CT scan or MRI in 6 months is recommended to ensure stability and rule out neoplasm. Sigmoid diverticulosis without inflammation. Mild prostatic enlargement. Aortic Atherosclerosis (ICD10-I70.0). Electronically Signed   By: Lupita RaiderJames  Green Jr, M.D.   On: 06/27/2018 21:01    ____________________________________________   PROCEDURES  Procedure(s) performed:   Procedures  None ____________________________________________   INITIAL IMPRESSION / ASSESSMENT AND PLAN / ED COURSE  Pertinent labs & imaging results that were available during my care of the patient were reviewed by me and considered in my medical decision making (see chart for details).   Patient with LLQ pain radiating to the flank with intermittent symptoms. Non-tender on exam. CT shows 2 kidney stones on the left. No infection. No bilateral stones. No AKI. Patient pain is well controlled. Patient to f/u with Urology by phone on Monday to schedule an appointment. Discussed pain control at home and ED return precautions. Discussed Percocet side effects and to not drive while taking this medication.    ____________________________________________  FINAL CLINICAL IMPRESSION(S) / ED DIAGNOSES  Final diagnoses:  Kidney stone  Pancreatic cyst     MEDICATIONS GIVEN DURING THIS VISIT:  Medications  iohexol (OMNIPAQUE) 300 MG/ML solution 100 mL (100 mLs Intravenous Contrast Given 06/27/18 2020)  fentaNYL (SUBLIMAZE) injection 50 mcg (50 mcg Intravenous Given 06/27/18 2033)     NEW OUTPATIENT MEDICATIONS STARTED DURING THIS VISIT:  Discharge Medication List as of 06/27/2018 10:08 PM    START taking these medications   Details  oxyCODONE-acetaminophen (PERCOCET/ROXICET) 5-325 MG tablet Take 2 tablets by mouth every 6 (six) hours as needed for up to 5 days for severe pain., Starting Sat 06/27/2018, Until  Thu 07/02/2018, Normal    tamsulosin (FLOMAX) 0.4 MG CAPS capsule Take 1 capsule (0.4 mg total) by mouth daily for 14 days., Starting Sat 06/27/2018, Until Sat 07/11/2018, Normal        Note:  This document was prepared using Dragon voice recognition software and may include unintentional dictation errors.  Alona Bene, MD Emergency Medicine    Damir Leung, Arlyss Repress, MD 06/28/18 [phone]

## 2018-06-27 NOTE — Discharge Instructions (Signed)
You have been seen in the Emergency Department (ED) today for pain that we believe based on your workup, is caused by kidney stones.  As we have discussed, please drink plenty of fluids.  Please make a follow up appointment with the physician(s) listed elsewhere in this documentation.  You also have a cyst on the Pancreas. Call your PCP and they can schedule a follow up CT in 6 months.   You may take pain medication as needed but ONLY as prescribed.  Please also take your prescribed Flomax daily.  We also recommend that you take over-the-counter ibuprofen regularly according to label instructions over the next 5 days.  Take it with meals to minimize stomach discomfort.  Please see your doctor as soon as possible as stones may take 1-3 weeks to pass and you may require additional care or medications.  Do not drink alcohol, drive or participate in any other potentially dangerous activities while taking opiate pain medication as it may make you sleepy. Do not take this medication with any other sedating medications, either prescription or over-the-counter. If you were prescribed Percocet or Vicodin, do not take these with acetaminophen (Tylenol) as it is already contained within these medications.   Take Percocet as needed for severe pain.  This medication is an opiate (or narcotic) pain medication and can be habit forming.  Use it as little as possible to achieve adequate pain control.  Do not use or use it with extreme caution if you have a history of opiate abuse or dependence.  If you are on a pain contract with your primary care doctor or a pain specialist, be sure to let them know you were prescribed this medication today from the Emergency Department.  This medication is intended for your use only - do not give any to anyone else and keep it in a secure place where nobody else, especially children, have access to it.  It will also cause or worsen constipation, so you may want to consider taking an  over-the-counter stool softener while you are taking this medication.  Return to the Emergency Department (ED) or call your doctor if you have any worsening pain, fever, painful urination, are unable to urinate, or develop other symptoms that concern you.    Kidney Stones Kidney stones (urolithiasis) are deposits that form inside your kidneys. The intense pain is caused by the stone moving through the urinary tract. When the stone moves, the ureter goes into spasm around the stone. The stone is usually passed in the urine.  CAUSES  A disorder that makes certain neck glands produce too much parathyroid hormone (primary hyperparathyroidism). A buildup of uric acid crystals, similar to gout in your joints. Narrowing (stricture) of the ureter. A kidney obstruction present at birth (congenital obstruction). Previous surgery on the kidney or ureters. Numerous kidney infections. SYMPTOMS  Feeling sick to your stomach (nauseous). Throwing up (vomiting). Blood in the urine (hematuria). Pain that usually spreads (radiates) to the groin. Frequency or urgency of urination. DIAGNOSIS  Taking a history and physical exam. Blood or urine tests. CT scan. Occasionally, an examination of the inside of the urinary bladder (cystoscopy) is performed. TREATMENT  Observation. Increasing your fluid intake. Extracorporeal shock wave lithotripsy--This is a noninvasive procedure that uses shock waves to break up kidney stones. Surgery may be needed if you have severe pain or persistent obstruction. There are various surgical procedures. Most of the procedures are performed with the use of small instruments. Only small incisions are needed  to accommodate these instruments, so recovery time is minimized. The size, location, and chemical composition are all important variables that will determine the proper choice of action for you. Talk to your health care provider to better understand your situation so that you  will minimize the risk of injury to yourself and your kidney.  HOME CARE INSTRUCTIONS  Drink enough water and fluids to keep your urine clear or pale yellow. This will help you to pass the stone or stone fragments. Strain all urine through the provided strainer. Keep all particulate matter and stones for your health care provider to see. The stone causing the pain may be as small as a grain of salt. It is very important to use the strainer each and every time you pass your urine. The collection of your stone will allow your health care provider to analyze it and verify that a stone has actually passed. The stone analysis will often identify what you can do to reduce the incidence of recurrences. Only take over-the-counter or prescription medicines for pain, discomfort, or fever as directed by your health care provider. Keep all follow-up visits as told by your health care provider. This is important. Get follow-up X-rays if required. The absence of pain does not always mean that the stone has passed. It may have only stopped moving. If the urine remains completely obstructed, it can cause loss of kidney function or even complete destruction of the kidney. It is your responsibility to make sure X-rays and follow-ups are completed. Ultrasounds of the kidney can show blockages and the status of the kidney. Ultrasounds are not associated with any radiation and can be performed easily in a matter of minutes. Make changes to your daily diet as told by your health care provider. You may be told to: Limit the amount of salt that you eat. Eat 5 or more servings of fruits and vegetables each day. Limit the amount of meat, poultry, fish, and eggs that you eat. Collect a 24-hour urine sample as told by your health care provider. You may need to collect another urine sample every 6-12 months. SEEK MEDICAL CARE IF: You experience pain that is progressive and unresponsive to any pain medicine you have been  prescribed. SEEK IMMEDIATE MEDICAL CARE IF:  Pain cannot be controlled with the prescribed medicine. You have a fever or shaking chills. The severity or intensity of pain increases over 18 hours and is not relieved by pain medicine. You develop a new onset of abdominal pain. You feel faint or pass out. You are unable to urinate.   This information is not intended to replace advice given to you by your health care provider. Make sure you discuss any questions you have with your health care provider.   Document Released: 03/04/2005 Document Revised: 11/23/2014 Document Reviewed: 08/05/2012 Elsevier Interactive Patient Education [employer] Inc.

## 2018-06-27 NOTE — ED Notes (Signed)
Patient transported to CT 

## 2018-06-27 NOTE — ED Triage Notes (Signed)
Pt reports that 2 Wed ago he ate some left over [place] and had abd pains with n/v/d. Saw PCP. Reports since then had left lateral abd pains and some left flank pains that are intermittent and get worse after eating as well as some diarrhea.

## 2018-06-29 LAB — URINE CULTURE: Culture: NO GROWTH

## 2018-06-30 ENCOUNTER — Encounter (HOSPITAL_COMMUNITY): Payer: Self-pay

## 2018-07-02 ENCOUNTER — Encounter (HOSPITAL_COMMUNITY): Payer: Self-pay

## 2018-07-07 ENCOUNTER — Encounter (HOSPITAL_COMMUNITY): Payer: Self-pay

## 2018-07-09 ENCOUNTER — Encounter (HOSPITAL_COMMUNITY): Payer: Self-pay

## 2018-07-09 ENCOUNTER — Other Ambulatory Visit: Payer: Medicare Other

## 2018-07-09 ENCOUNTER — Telehealth: Payer: Self-pay | Admitting: Cardiovascular Disease

## 2018-07-09 NOTE — Telephone Encounter (Signed)
Spoke with patient who states he is returning the call from our office yesterday and that he will need to have a telephone visit because he does not have a Chiropodist. He reports he monitors BP regularly. Today BP is 134/67 mmHg, pulse 57 bpm, and weight is 180 lb. He states he feels well and agrees to virtual visit. He has recent lab work from a hospital visit for kidney stone.

## 2018-07-09 NOTE — Telephone Encounter (Signed)
Pt has appt with Dr. Elease Hashimoto 4/27.Marland Kitchen

## 2018-07-09 NOTE — Telephone Encounter (Signed)
New Message:   Patient calling concering is appt. He states some one called him on yesterday. Please call patient.

## 2018-07-13 ENCOUNTER — Telehealth (INDEPENDENT_AMBULATORY_CARE_PROVIDER_SITE_OTHER): Payer: Medicare Other | Admitting: Cardiovascular Disease

## 2018-07-13 ENCOUNTER — Encounter: Payer: Self-pay | Admitting: Cardiovascular Disease

## 2018-07-13 ENCOUNTER — Other Ambulatory Visit: Payer: Self-pay

## 2018-07-13 VITALS — BP 134/58 | HR 55 | Ht 68.0 in | Wt 182.0 lb

## 2018-07-13 DIAGNOSIS — E782 Mixed hyperlipidemia: Secondary | ICD-10-CM

## 2018-07-13 DIAGNOSIS — I10 Essential (primary) hypertension: Secondary | ICD-10-CM

## 2018-07-13 DIAGNOSIS — I2510 Atherosclerotic heart disease of native coronary artery without angina pectoris: Secondary | ICD-10-CM | POA: Diagnosis not present

## 2018-07-13 DIAGNOSIS — Z7189 Other specified counseling: Secondary | ICD-10-CM | POA: Diagnosis not present

## 2018-07-13 NOTE — Patient Instructions (Signed)

## 2018-07-13 NOTE — Progress Notes (Signed)
Virtual Visit via Telephone Note   This visit type was conducted due to national recommendations for restrictions regarding the COVID-19 Pandemic (e.g. social distancing) in an effort to limit this patient's exposure and mitigate transmission in our community.  Due to his co-morbid illnesses, this patient is at least at moderate risk for complications without adequate follow up.  This format is felt to be most appropriate for this patient at this time.  The patient did not have access to video technology/had technical difficulties with video requiring transitioning to audio format only (telephone).  All issues noted in this document were discussed and addressed.  No physical exam could be performed with this format.  Please refer to the patient's chart for his  consent to telehealth for [facility].   Evaluation Performed:  Follow-up visit  Date:  07/13/2018   ID:  William Hernandez, DOB 12/29/1945, MRN [id number]  Patient Location: Home Provider Location: Office  PCP:  Kaleen MaskElkins, Wilson Oliver, MD  Cardiologist:  Kristeen MissPhilip , MD  Electrophysiologist:  None   Chief Complaint:  CAD   History of Present Illness:    William KaufmannLawrence D Neils is a 73 y.o. male with  CAD  He remains very active.   Participates in many athletic activities . Hx of hyperlipidemia.  Technical issues with his tablet ( no suitable browser)   Has done well Had kidney stones 2 weeks ago   Had some N/Vand D after eating left over [place] food   Still visiting ( virtual visit ) with Emiliano DyerBecky Kincaid, MS for depression   shoots basketball at home.   Walks around his neighborhood    The patient does not have symptoms concerning for COVID-19 infection (fever, chills, cough, or new shortness of breath).    Past Medical History:  Diagnosis Date  . Arrhythmia    afib  . Coronary artery disease    2003  . Depression   . Hyperlipidemia   . S/P hernia surgery    Past Surgical History:  Procedure Laterality Date  .  CARDIAC CATHETERIZATION     11/2001  . CARDIAC CATHETERIZATION N/A 08/04/2015   Procedure: Left Heart Cath and Cors/Grafts Angiography;  Surgeon: Tonny BollmanMichael Cooper, MD;  [facility];  Service: Cardiovascular;  Laterality: N/A;  . CHOLECYSTECTOMY    . CORONARY ARTERY BYPASS GRAFT  2000  . HERNIA REPAIR       Current Meds  Medication Sig  . aspirin 81 MG tablet Take 81 mg by mouth at bedtime.   Marland Kitchen. atorvastatin (LIPITOR) 20 MG tablet TAKE 1 TABLET BY MOUTH ONCE DAILY  . BYSTOLIC 5 MG tablet TAKE 1 TABLET (5 MG TOTAL) BY MOUTH DAILY.  . fish oil-omega-3 fatty acids 1000 MG capsule Take 1 g by mouth daily.   . Flaxseed, Linseed, (FLAX SEED OIL PO) Take 1 tablet by mouth daily.   . irbesartan (AVAPRO) 150 MG tablet Take 75 mg by mouth 2 (two) times a day.  . loratadine (CLARITIN) 10 MG tablet Take 10 mg by mouth daily.  . Multiple Vitamin (MULTIVITAMIN PO) Take 1 tablet by mouth daily.   . nitroGLYCERIN (NITROSTAT) 0.4 MG SL tablet Place 1 tablet (0.4 mg total) under the tongue every 5 (five) minutes as needed for chest pain.  Marland Kitchen. omeprazole (PRILOSEC) 20 MG capsule Take 20 mg by mouth daily.  . sildenafil (REVATIO) 20 MG tablet as directed. Take 1-5 tablets by mouth daily as needed  . traZODone (DESYREL) 150 MG tablet Take  200 mg by mouth at bedtime as needed for sleep.      Allergies:   Ace inhibitors; Lisinopril; and Penicillins   Social History   Tobacco Use  . Smoking status: Never Smoker  . Smokeless tobacco: Never Used  Substance Use Topics  . Alcohol use: No  . Drug use: No     Family Hx: The patient's family history includes Heart disease in his father.  ROS:   Please see the history of present illness.     All other systems reviewed and are negative.   Prior CV studies:   The following studies were reviewed today:    Labs/Other Tests and Data Reviewed:    EKG:  No ECG reviewed.  Recent Labs: 06/27/2018: ALT 40; BUN 18; Creatinine, Ser 1.21;  Hemoglobin 16.3; Platelets 205; Potassium 4.1; Sodium 135   Recent Lipid Panel Lab Results  Component Value Date/Time   CHOL 133 01/06/2018 08:05 AM   TRIG 87 01/06/2018 08:05 AM   HDL 49 01/06/2018 08:05 AM   CHOLHDL 2.7 01/06/2018 08:05 AM   CHOLHDL 2.2 11/23/2015 08:58 AM   LDLCALC 67 01/06/2018 08:05 AM    Wt Readings from Last 3 Encounters:  07/13/18 182 lb (82.6 kg)  01/06/18 191 lb 6.4 oz (86.8 kg)  07/10/17 187 lb 9.6 oz (85.1 kg)     Objective:    Vital Signs:  BP (!) 134/58 (BP Location: Left Arm, Patient Position: Sitting, Cuff Size: Normal)   Pulse (!) 55   Ht 5\' 8"  (1.727 m)   Wt 182 lb (82.6 kg)   BMI 27.67 kg/m    VITAL SIGNS:  reviewed GEN:  no acute distress EYES:  sclerae anicteric, EOMI - Extraocular Movements Intact RESPIRATORY:  normal respiratory effort, symmetric expansion CARDIOVASCULAR:  no peripheral edema SKIN:  no rash, lesions or ulcers. MUSCULOSKELETAL:  no obvious deformities. NEURO:  alert and oriented x 3, no obvious focal deficit PSYCH:  normal affect  ASSESSMENT & PLAN:    1. CAD :  Doing great.   No angina .  Continue current meds VS are normal   2. Hyperlipidemia :  He will have Dr. Jeannetta Nap draw them in 3 months   3.  HTN:   BP is well controlled.    COVID-19 Education: The signs and symptoms of COVID-19 were discussed with the patient and how to seek care for testing (follow up with PCP or arrange E-visit).  The importance of social distancing was discussed today.  Time:   Today, I have spent  15  minutes with the patient with telehealth technology discussing the above problems.     Medication Adjustments/Labs and Tests Ordered: Current medicines are reviewed at length with the patient today.  Concerns regarding medicines are outlined above.   Tests Ordered: No orders of the defined types were placed in this encounter.   Medication Changes: No orders of the defined types were placed in this encounter.   Disposition:   Follow up in 6 month(s)  Signed, Kristeen Miss, MD  07/13/2018 2:48 PM    [facility]

## 2018-07-14 ENCOUNTER — Encounter (HOSPITAL_COMMUNITY): Payer: Self-pay

## 2018-07-16 ENCOUNTER — Encounter (HOSPITAL_COMMUNITY): Payer: Self-pay

## 2018-07-21 ENCOUNTER — Encounter (HOSPITAL_COMMUNITY): Payer: Self-pay

## 2018-07-23 ENCOUNTER — Encounter (HOSPITAL_COMMUNITY): Payer: Self-pay

## 2018-07-24 ENCOUNTER — Other Ambulatory Visit: Payer: Self-pay | Admitting: Cardiovascular Disease

## 2018-07-28 ENCOUNTER — Encounter (HOSPITAL_COMMUNITY): Payer: Self-pay

## 2018-07-30 ENCOUNTER — Encounter (HOSPITAL_COMMUNITY): Payer: Self-pay

## 2018-08-04 ENCOUNTER — Encounter (HOSPITAL_COMMUNITY): Payer: Self-pay

## 2018-08-06 ENCOUNTER — Encounter (HOSPITAL_COMMUNITY): Payer: Self-pay

## 2018-08-11 ENCOUNTER — Encounter (HOSPITAL_COMMUNITY): Payer: Self-pay

## 2018-08-13 ENCOUNTER — Encounter (HOSPITAL_COMMUNITY): Payer: Self-pay

## 2018-08-18 ENCOUNTER — Encounter (HOSPITAL_COMMUNITY): Payer: Self-pay

## 2018-08-20 ENCOUNTER — Encounter (HOSPITAL_COMMUNITY): Payer: Self-pay

## 2018-08-25 ENCOUNTER — Encounter (HOSPITAL_COMMUNITY): Payer: Self-pay

## 2018-08-27 ENCOUNTER — Encounter (HOSPITAL_COMMUNITY): Payer: Self-pay

## 2018-09-01 ENCOUNTER — Encounter (HOSPITAL_COMMUNITY): Payer: Self-pay

## 2018-09-03 ENCOUNTER — Encounter (HOSPITAL_COMMUNITY): Payer: Self-pay

## 2018-09-08 ENCOUNTER — Encounter (HOSPITAL_COMMUNITY): Payer: Self-pay

## 2018-09-10 ENCOUNTER — Encounter (HOSPITAL_COMMUNITY): Payer: Self-pay

## 2018-09-15 ENCOUNTER — Telehealth (HOSPITAL_COMMUNITY): Payer: Self-pay | Admitting: *Deleted

## 2018-09-15 ENCOUNTER — Encounter (HOSPITAL_COMMUNITY): Payer: Self-pay

## 2018-09-15 NOTE — Telephone Encounter (Signed)
Called to update patient on status of the maintenance Cardiac Rehab exercise classes.  At this point they remain on hold due to the COVID-19 pandemic precautions.

## 2018-11-30 ENCOUNTER — Telehealth: Payer: Self-pay | Admitting: Cardiovascular Disease

## 2018-11-30 DIAGNOSIS — R7309 Other abnormal glucose: Secondary | ICD-10-CM

## 2018-11-30 DIAGNOSIS — E782 Mixed hyperlipidemia: Secondary | ICD-10-CM

## 2018-11-30 DIAGNOSIS — I2510 Atherosclerotic heart disease of native coronary artery without angina pectoris: Secondary | ICD-10-CM

## 2018-11-30 NOTE — Telephone Encounter (Signed)
Patient wants to know if he will need blood work done before his f/u appt in October, or if it will be done at his appt. He just wants to know because he did a Telemedicine visit in April. He states Dr. Acie Fredrickson normally checks his cholesterol and a1c.  Please advise

## 2018-12-10 NOTE — Telephone Encounter (Signed)
Spoke with patient and his wife and scheduled lab appointment for Monday 10/26, prior to patient's office visit with Dr. Acie Fredrickson on 10/28. Patient verbalized understanding that he will come in fasting for lab work. He thanked me for the call.

## 2019-01-01 ENCOUNTER — Other Ambulatory Visit: Payer: Self-pay | Admitting: Cardiovascular Disease

## 2019-01-11 ENCOUNTER — Other Ambulatory Visit: Payer: Self-pay

## 2019-01-11 ENCOUNTER — Other Ambulatory Visit: Payer: Medicare Other | Admitting: *Deleted

## 2019-01-11 DIAGNOSIS — Z87898 Personal history of other specified conditions: Secondary | ICD-10-CM

## 2019-01-11 DIAGNOSIS — R7309 Other abnormal glucose: Secondary | ICD-10-CM

## 2019-01-11 DIAGNOSIS — E782 Mixed hyperlipidemia: Secondary | ICD-10-CM

## 2019-01-11 DIAGNOSIS — I2510 Atherosclerotic heart disease of native coronary artery without angina pectoris: Secondary | ICD-10-CM

## 2019-01-11 LAB — HEMOGLOBIN A1C
Est. average glucose Bld gHb Est-mCnc: 108 mg/dL
Hgb A1c MFr Bld: 5.4 % (ref 4.8–5.6)

## 2019-01-11 LAB — LIPID PANEL
Chol/HDL Ratio: 2.4 ratio (ref 0.0–5.0)
Cholesterol, Total: 129 mg/dL (ref 100–199)
HDL: 54 mg/dL (ref >39)
LDL Chol Calc (NIH): 56 mg/dL (ref 0–99)
Triglycerides: 102 mg/dL (ref 0–149)
VLDL Cholesterol Cal: 19 mg/dL (ref 5–40)

## 2019-01-11 LAB — HEPATIC FUNCTION PANEL
ALT: 17 IU/L (ref 0–44)
AST: 14 IU/L (ref 0–40)
Albumin: 4.5 g/dL (ref 3.7–4.7)
Alkaline Phosphatase: 60 IU/L (ref 39–117)
Bilirubin Total: 1.7 mg/dL — ABNORMAL HIGH (ref 0.0–1.2)
Bilirubin, Direct: 0.37 mg/dL (ref 0.00–0.40)
Total Protein: 7 g/dL (ref 6.0–8.5)

## 2019-01-11 LAB — BASIC METABOLIC PANEL
BUN/Creatinine Ratio: 15 (ref 10–24)
BUN: 14 mg/dL (ref 8–27)
CO2: 26 mmol/L (ref 20–29)
Calcium: 9.7 mg/dL (ref 8.6–10.2)
Chloride: 100 mmol/L (ref 96–106)
Creatinine, Ser: 0.94 mg/dL (ref 0.76–1.27)
GFR calc Af Amer: 92 mL/min/{1.73_m2} (ref >59)
GFR calc non Af Amer: 80 mL/min/{1.73_m2} (ref >59)
Glucose: 119 mg/dL — ABNORMAL HIGH (ref 65–99)
Potassium: 4.2 mmol/L (ref 3.5–5.2)
Sodium: 138 mmol/L (ref 134–144)

## 2019-01-11 LAB — PSA: Prostate Specific Ag, Serum: 2 ng/mL (ref 0.0–4.0)

## 2019-01-13 ENCOUNTER — Ambulatory Visit (INDEPENDENT_AMBULATORY_CARE_PROVIDER_SITE_OTHER): Payer: Medicare Other | Admitting: Cardiovascular Disease

## 2019-01-13 ENCOUNTER — Encounter: Payer: Self-pay | Admitting: Cardiovascular Disease

## 2019-01-13 ENCOUNTER — Other Ambulatory Visit: Payer: Self-pay

## 2019-01-13 VITALS — BP 132/80 | HR 58 | Ht 68.0 in | Wt 181.6 lb

## 2019-01-13 DIAGNOSIS — E782 Mixed hyperlipidemia: Secondary | ICD-10-CM

## 2019-01-13 DIAGNOSIS — I10 Essential (primary) hypertension: Secondary | ICD-10-CM | POA: Diagnosis not present

## 2019-01-13 DIAGNOSIS — E785 Hyperlipidemia, unspecified: Secondary | ICD-10-CM | POA: Diagnosis not present

## 2019-01-13 DIAGNOSIS — I25810 Atherosclerosis of coronary artery bypass graft(s) without angina pectoris: Secondary | ICD-10-CM

## 2019-01-13 NOTE — Patient Instructions (Signed)
Medication Instructions:   *If you need a refill on your cardiac medications before your next appointment, please call your pharmacy*  Lab Work: Your physician recommends that you return for lab work in: 1 year - fasting lipid, liver, and BMET If you have labs (blood work) drawn today and your tests are completely normal, you will receive your results only by: Marland Kitchen MyChart Message (if you have MyChart) OR . A paper copy in the mail If you have any lab test that is abnormal or we need to change your treatment, we will call you to review the results.  Testing/Procedures: None ordered today.  Follow-Up: At [facility], you and your health needs are our priority.  As part of our continuing mission to provide you with exceptional heart care, we have created designated Provider Care Teams.  These Care Teams include your primary Cardiologist (physician) and Advanced Practice Providers (APPs -  Physician Assistants and Nurse Practitioners) who all work together to provide you with the care you need, when you need it.  Your next appointment:   12 months  The format for your next appointment:   In Person  Provider:   You may see Mertie Moores, MD or one of the following Advanced Practice Providers on your designated Care Team:    Richardson Dopp, PA-C  [place], [place]  Daune Perch, [place]

## 2019-01-13 NOTE — Progress Notes (Signed)
Cardiology Office Note   Date:  01/13/2019   ID:  William Hernandez, DOB 16-Dec-1945, MRN [id number]  PCP:  Kaleen Mask, MD  Cardiologist:   Kristeen Miss, MD   No chief complaint on file.  1. CAD ( CABG Oct. 6, 2003)  2. Dyslipidemia 3. Atrial fibrillation - post op from CABG.  4. Depression     William Hernandez is a 73 year old gentleman with a history of coronary disease-status post coronary artery bypass grafting. He's done very well from a cardiac standpoint. He is participating in cardiac rehabilitation and has not had any episodes of chest pain or shortness breath.  He has done well. His lipids are well controlled.   He is participating in cardiac rehab and is doing well. 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 He doesn't have any episodes of chest pain or shortness breath when he is active. 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  He continues to have issues with anxiety.   July 14, 2012:  William Hernandez is doing well. He had a nose bleed several weeks ago. He is exercising regularly. No angina. No dyspnea.  Oct. 27, 2014:  He is doing well. 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 2 very hard games of 3 on 3 , half court game. No pain. Active. Exercises. Increased his Trazadone to help with some additional depression.   July 14 2013:  William Hernandez is doing well. He remains very active. Plays in the Senior games. No CP. Has has a URI for the past 10 days which has slowed him down a bit.   Oct. 26, 2015:  William Hernandez is doing well.  Bp is well controlled. Doing lots of fishing. Plays basketball on Wednesdays. No Cp .   Jul 21, 2014:  William Hernandez is a 73 y.o. male who presents for follow up of his CAD and hyperlipidemia Still exercising well.   Still playing basketball regularly.  No angina    01/17/2015: Doing well from a cardiac standpoint. Has had a head  cold - difficulty hearing in his left ear   June 19, 2015:  William Hernandez is seen for follow up visit .  Still playing basketball regularly . No  CP or dyspnea.     Aug 02, 2015:  William Hernandez is seen back for an episode CP that occurred in Cardiac rehab yesterday   Feels it at cardiac rehab Also when he first starts out playing basket ball After he gets warmed up, he feels fine.  Also occurs when he is mowing the grass.    myoview today shows no large area of ishcmeia. LV function is mildly depressed with EF 45%   Sept. 7, 2017:  William Hernandez is seen today . Had a cardiac cath in May, 2017: 1. Severe three-vessel native coronary artery disease 2. Status post CABG with patent RIMA to PDA, LIMA to LAD, sequential saphenous vein graft to OM1 and OM2, and saphenous vein graft to first diagonal 3. 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 No angina   June 03, 2016:  Still playing basketball , no angina  Still fly fishing   Oct. 23, 2018:  Doing very well.  No angina  Plays basket ball regularly . Goes to cardiac rehab.  No angina   April 25 , 2109:  William Hernandez is doing well   Patrici Ranks the senior games softball  throw today .   Will be playing on BB team  Exercises regularly , no angina   January 13, 2019:  William Hernandez is seen today for follow-up visit.  I saw him in April via telemedicine visit.  He is done very well.  He is not having any episodes of chest discomfort.  He remains very active.  Was diagnosed with Lyme disease Is on Doxycycline.  Is improving  Has been found to have several cysts in his pancreas. Unchanged from previous CT scans..  Had 2 friends pass away recently .     Past Medical History:  Diagnosis Date  . Arrhythmia    afib  . Coronary artery disease    2003  . Depression   . Hyperlipidemia   . S/P hernia surgery     Past Surgical History:  Procedure Laterality Date  . CARDIAC CATHETERIZATION     11/2001  .  CARDIAC CATHETERIZATION N/A 08/04/2015   Procedure: Left Heart Cath and Cors/Grafts Angiography;  Surgeon: Sherren Mocha, MD;  Location: [facility];  Service: Cardiovascular;  Laterality: N/A;  . CHOLECYSTECTOMY    . CORONARY ARTERY BYPASS GRAFT  2000  . HERNIA REPAIR       Current Outpatient Medications  Medication Sig Dispense Refill  . aspirin 81 MG tablet Take 81 mg by mouth at bedtime.     Marland Kitchen atorvastatin (LIPITOR) 20 MG tablet TAKE 1 TABLET BY MOUTH ONCE DAILY 90 tablet 3  . BYSTOLIC 5 MG tablet TAKE 1 TABLET BY MOUTH DAILY. 30 tablet 5  . doxycycline (VIBRA-TABS) 100 MG tablet Take 1 tablet by mouth 2 (two) times daily.    . fish oil-omega-3 fatty acids 1000 MG capsule Take 1 g by mouth daily.     . Flaxseed, Linseed, (FLAX SEED OIL PO) Take 1 tablet by mouth daily.     . irbesartan (AVAPRO) 150 MG tablet Take 75 mg by mouth 2 (two) times a day.    . loratadine (CLARITIN) 10 MG tablet Take 10 mg by mouth daily.    . Multiple Vitamin (MULTIVITAMIN PO) Take 1 tablet by mouth daily.     . nitroGLYCERIN (NITROSTAT) 0.4 MG SL tablet Place 1 tablet (0.4 mg total) under the tongue every 5 (five) minutes as needed for chest pain. 25 tablet 3  . omeprazole (PRILOSEC) 20 MG capsule Take 20 mg by mouth daily.    . sildenafil (REVATIO) 20 MG tablet as directed. Take 1-5 tablets by mouth daily as needed    . traZODone (DESYREL) 150 MG tablet Take 200 mg by mouth at bedtime as needed for sleep.   0   No current facility-administered medications for this visit.     Allergies:   Ace inhibitors, Lisinopril, and Penicillins    Social History:  The patient  reports that he has never smoked. He has never used smokeless tobacco. He reports that he does not drink alcohol or use drugs.   Family History:  The patient's family history includes Heart disease in his father.   ROS:    Noted in current hx, otherwise negative   Physical Exam: Blood pressure 132/80, pulse (!) 58, height 5\' 8"   (1.727 m), weight 181 lb 9.6 oz (82.4 kg), SpO2 98 %.  GEN:  Well nourished, well developed in no acute distress HEENT: Normal NECK: No JVD; No carotid bruits LYMPHATICS: No lymphadenopathy CARDIAC: RRR   RESPIRATORY:  Clear to auscultation without rales, wheezing or rhonchi  ABDOMEN: Soft, non-tender, non-distended  MUSCULOSKELETAL:  No edema; No deformity  SKIN: Warm and dry NEUROLOGIC:  Alert and oriented x 3   EKG:   January 13, 2019: Sinus bradycardia 58 beats minute.  Otherwise normal EKG.  Recent Labs: 06/27/2018: Hemoglobin 16.3; Platelets 205 01/11/2019: ALT 17; BUN 14; Creatinine, Ser 0.94; Potassium 4.2; Sodium 138    Lipid Panel    Component Value Date/Time   CHOL 129 01/11/2019 0749   TRIG 102 01/11/2019 0749   HDL 54 01/11/2019 0749   CHOLHDL 2.4 01/11/2019 0749   CHOLHDL 2.2 11/23/2015 0858   VLDL 14 11/23/2015 0858   LDLCALC 56 01/11/2019 0749      Wt Readings from Last 3 Encounters:  01/13/19 181 lb 9.6 oz (82.4 kg)  07/13/18 182 lb (82.6 kg)  01/06/18 191 lb 6.4 oz (86.8 kg)      Other studies Reviewed: Additional studies/ records that were reviewed today include: . Review of the above records demonstrates:    ASSESSMENT AND PLAN:  1. CAD ( CABG Oct. 6, 2003)  cath May 2017 -  1. Severe three-vessel native coronary artery disease 2. Status post CABG with patent RIMA to PDA, LIMA to LAD, sequential saphenous vein graft to OM1 and OM2, and saphenous vein graft to first diagonal 3. 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  No angina   2. Dyslipidemia-    Labs look great   3. Atrial fibrillation -no recurrent atrial fibrillation.  4. Depression - well controlled.   5. HTN:   BP is well controlled.   Current medicines are reviewed at length with the patient today.  The patient does not have concerns regarding medicines.  The following changes have been made:  no change  Labs/ tests ordered today  include:   Orders Placed This Encounter  Procedures  . Basic metabolic panel  . Hepatic function panel  . Lipid panel  . EKG 12-Lead   Disposition:   Follow up with me in 1 year   Kristeen MissPhilip Camri Molloy, MD  01/13/2019 9:12 AM    [facility] [address], [place], [place]  [address] Phone: [phone]; Fax: [phone]

## 2019-01-26 ENCOUNTER — Other Ambulatory Visit: Payer: Self-pay | Admitting: Cardiovascular Disease

## 2019-03-04 ENCOUNTER — Ambulatory Visit: Payer: Self-pay | Admitting: Psychology

## 2019-03-09 ENCOUNTER — Telehealth (HOSPITAL_COMMUNITY): Payer: Self-pay | Admitting: *Deleted

## 2019-03-09 NOTE — Telephone Encounter (Signed)
Contacted patient to update status of return to exercise in the cardiac rehab maintenance program. Patient aware that we will be temporarily suspending onsite maintenance exercise classes and would like to be contacted once we resume.  Sol Passer, MS, ACSM CEP 03/09/2019 1408

## 2019-04-08 ENCOUNTER — Ambulatory Visit: Payer: Medicare Other | Attending: Internal Medicine

## 2019-04-08 DIAGNOSIS — Z23 Encounter for immunization: Secondary | ICD-10-CM

## 2019-04-08 NOTE — Progress Notes (Signed)
   Covid-19 Vaccination Clinic  Name:  William Hernandez    MRN: [id number] DOB: [birth year]  04/08/2019  [name] was observed post Covid-19 immunization for 15 minutes without incidence. He was provided with Vaccine Information Sheet and instruction to access the V-Safe system.   [name] was instructed to call 911 with any severe reactions post vaccine: Marland Kitchen Difficulty breathing  . Swelling of your face and throat  . A fast heartbeat  . A bad rash all over your body  . Dizziness and weakness    Immunizations Administered    Name Date Dose VIS Date Route   Pfizer COVID-19 Vaccine 04/08/2019 10:38 AM 0.3 mL 02/26/2019 Intramuscular   Manufacturer: [employer], [employer]   Lot: [id number]   NDC: [address]-0

## 2019-04-29 ENCOUNTER — Ambulatory Visit: Payer: Medicare Other | Attending: Internal Medicine

## 2019-04-29 DIAGNOSIS — Z23 Encounter for immunization: Secondary | ICD-10-CM

## 2019-04-29 NOTE — Progress Notes (Signed)
   Covid-19 Vaccination Clinic  Name:  [name]    MRN: [id number] DOB: May 14, 1945  04/29/2019  Mr. [name] was observed post Covid-19 immunization for 15 minutes without incidence. He was provided with Vaccine Information Sheet and instruction to access the V-Safe system.   [name] was instructed to call 911 with any severe reactions post vaccine: Marland Kitchen Difficulty breathing  . Swelling of your face and throat  . A fast heartbeat  . A bad rash all over your body  . Dizziness and weakness    Immunizations Administered    Name Date Dose VIS Date Route   Pfizer COVID-19 Vaccine 04/29/2019 11:42 AM 0.3 mL 02/26/2019 Intramuscular   Manufacturer: [employer], [employer]   Lot: [id number]   NDC: [address]-7

## 2019-06-21 ENCOUNTER — Other Ambulatory Visit: Payer: Self-pay | Admitting: Gastroenterology

## 2019-06-21 DIAGNOSIS — K862 Cyst of pancreas: Secondary | ICD-10-CM

## 2019-07-03 ENCOUNTER — Other Ambulatory Visit: Payer: Self-pay | Admitting: Cardiovascular Disease

## 2019-07-19 ENCOUNTER — Ambulatory Visit
Admission: RE | Admit: 2019-07-19 | Discharge: 2019-07-19 | Disposition: A | Discharge status: Home / Self Care | Payer: Medicare Other | Source: Ambulatory Visit | Attending: Gastroenterology | Admitting: Gastroenterology

## 2019-07-19 ENCOUNTER — Other Ambulatory Visit: Payer: Self-pay

## 2019-07-19 ENCOUNTER — Other Ambulatory Visit: Payer: Self-pay | Admitting: Cardiovascular Disease

## 2019-07-19 DIAGNOSIS — K862 Cyst of pancreas: Secondary | ICD-10-CM

## 2019-07-19 IMAGING — MR MR ABDOMEN WO/W CM MRCP
12 of 19 series · 27 of 48 positions shown · IV contrast (multihance)
Comparison: CT abdomen pelvis, [DATE]

CLINICAL DATA: Characterize pancreatic cystic lesion

EXAM:
MRI ABDOMEN WITHOUT AND WITH CONTRAST (INCLUDING MRCP)
TECHNIQUE: Multiplanar multisequence MR imaging of the abdomen was performed
both before and after the administration of intravenous contrast.
Heavily T2-weighted images of the biliary and pancreatic ducts were
obtained, and three-dimensional MRCP images were rendered by post
processing.
CONTRAST:  16mL MULTIHANCE GADOBENATE DIMEGLUMINE 529 MG/ML IV SOLN

[Series 3: cor haste · coronal · 5.0mm · 0.74mm/px · 1 of 39 slices shown]
[im 1/39]
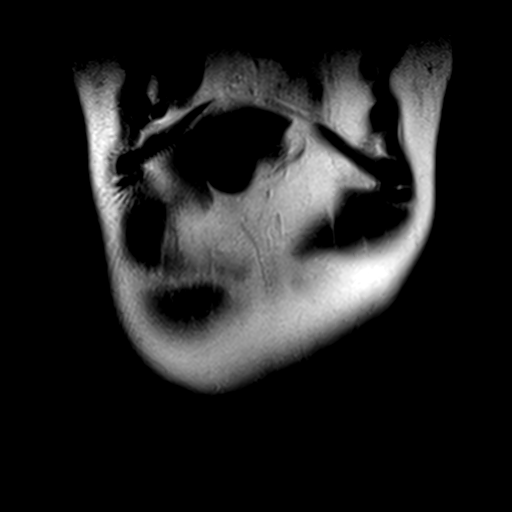

[Series 4: haste axial · axial · 5.0mm · 0.78mm/px · 1 of 38 slices shown]
[im 1/38]
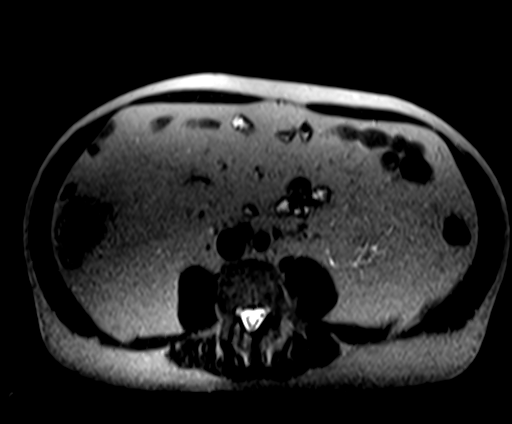

[Series 5: T1 · axial · 5.0mm · 0.70mm/px · z∈[-83,+133]mm · 3 of 74 slices shown]
[im 1/74]
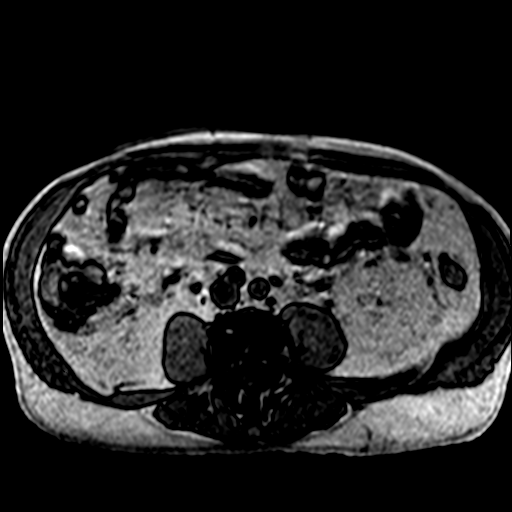
[im 37/74]
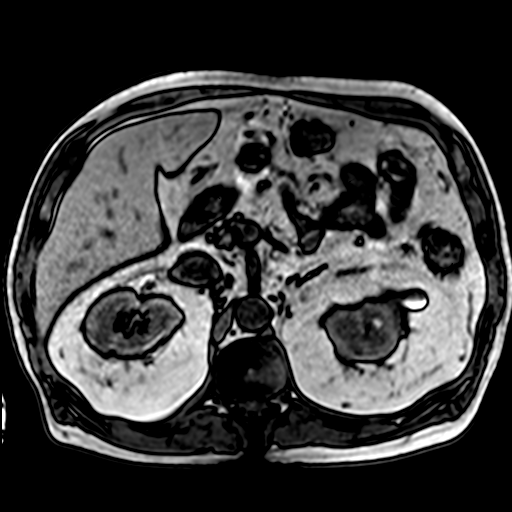
[im 74/74]
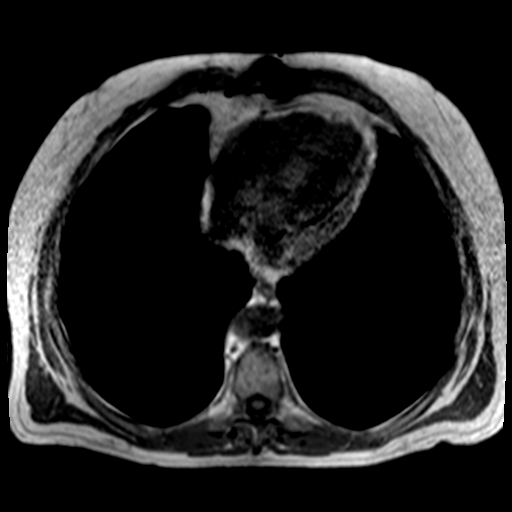

[Series 6: T2 · axial · 5.0mm · 0.99mm/px · z∈[-94,+162]mm · 2 of 42 slices shown]
[im 1/42]
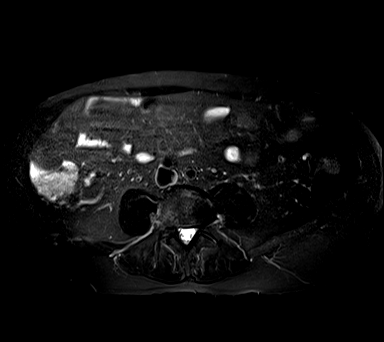
[im 42/42]
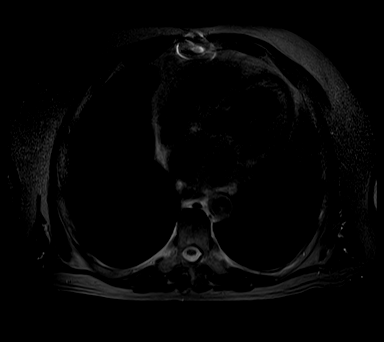

[Series 11: MRCP · sagittal · 0.89mm/px · 1 of 3 slices shown]
[im 1/3]
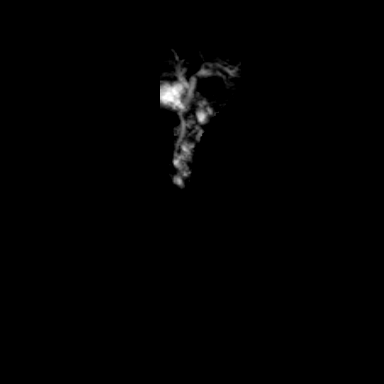

[Series 13: ep2d_diff_b50_500_800_p2_trig · axial · 5.0mm · 1.88mm/px · z∈[-85,+172]mm · 5 of 125 slices shown]
[im 1/125]
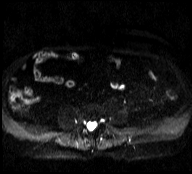
[im 32/125]
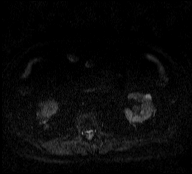
[im 63/125]
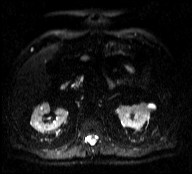
[im 94/125]
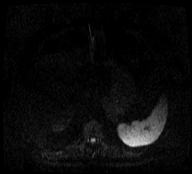
[im 125/125]
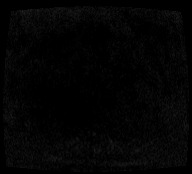

[Series 14: ep2d_[id number]_500_800_p2_trig_adc · axial · 5.0mm · 1.88mm/px · z∈[-85,+172]mm · 2 of 42 slices shown]
[im 1/42]
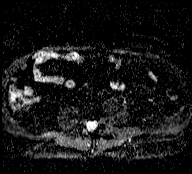
[im 42/42]
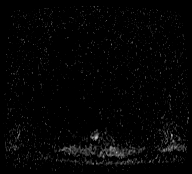

[Series 15: bSSFP · axial · 5.5mm · 0.70mm/px · 1 of 34 slices shown]
[im 1/34]
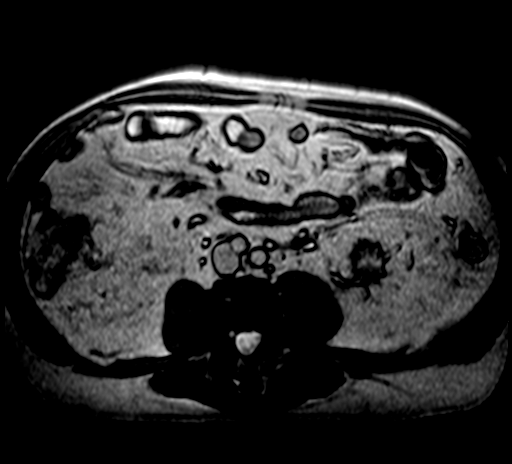

[Series 16: T1 dynamic · axial · non-contrast · 2.5mm · 0.74mm/px · z∈[-82,+136]mm · 3 of 88 slices shown]
[im 1/88]
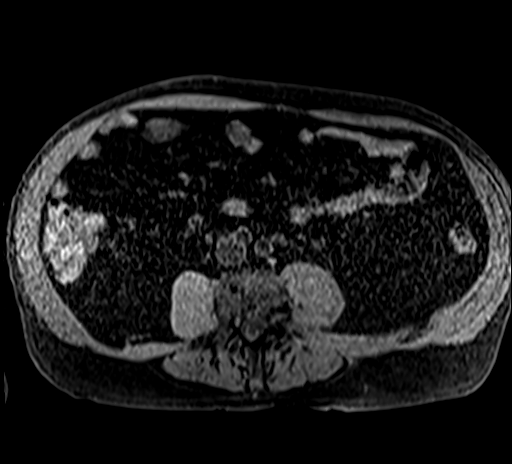
[im 44/88]
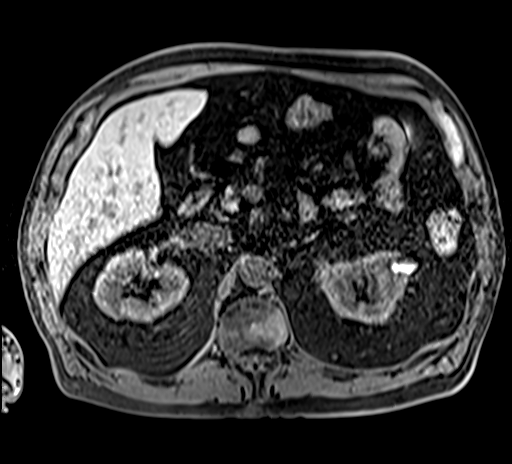
[im 88/88]
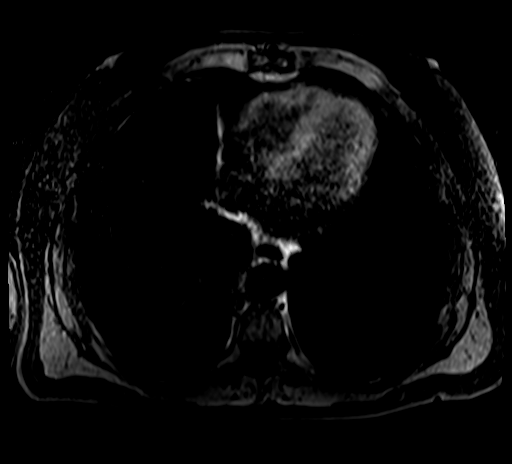

[Series 17: T1 dynamic post-contrast · axial · 2.5mm · 0.74mm/px · z∈[-82,+136]mm · 3 of 88 slices shown (1 of 3)]
[im 1/88]
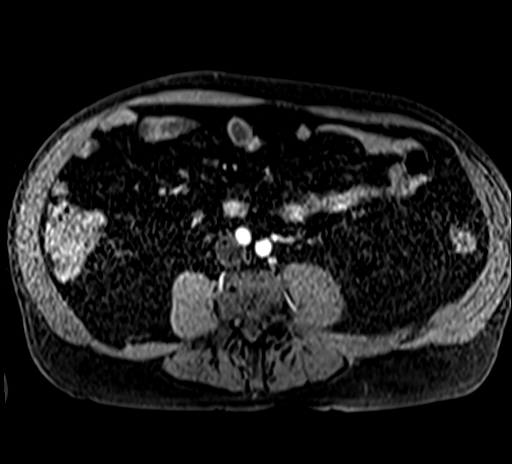
[im 44/88]
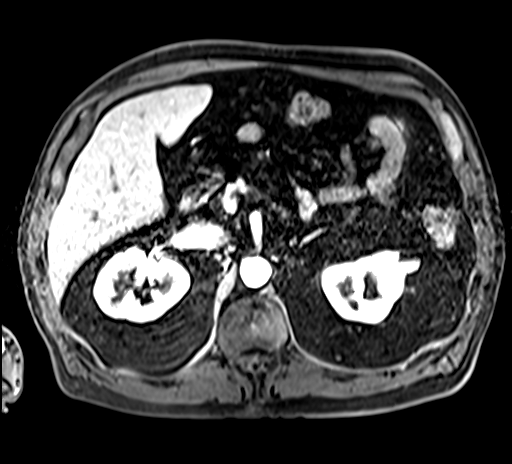
[im 88/88]
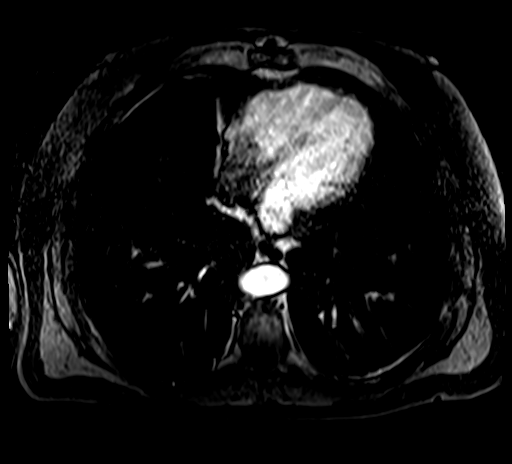

[Series 18: T1 dynamic post-contrast · axial · 2.5mm · 0.74mm/px · z∈[-82,+136]mm · 3 of 88 slices shown (2 of 3)]
[im 1/88]
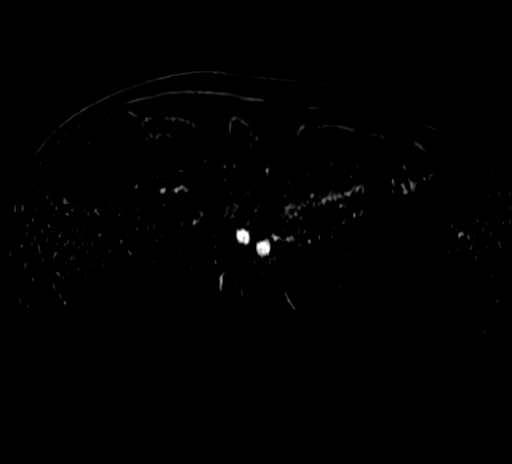
[im 44/88]
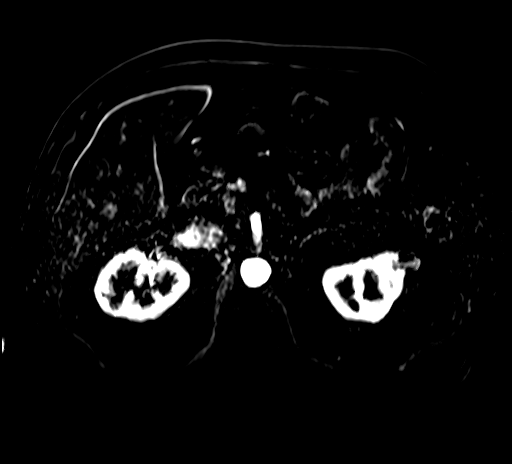
[im 88/88]
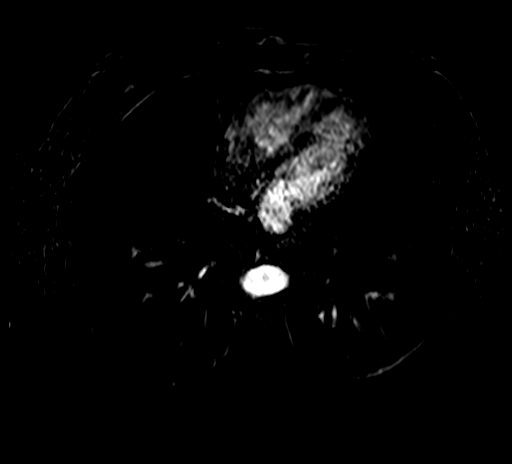

[Series 19: T1 dynamic post-contrast · axial · 2.5mm · 0.74mm/px · z∈[-82,+26]mm · 2 of 88 slices shown (3 of 3)]
[im 1/88]
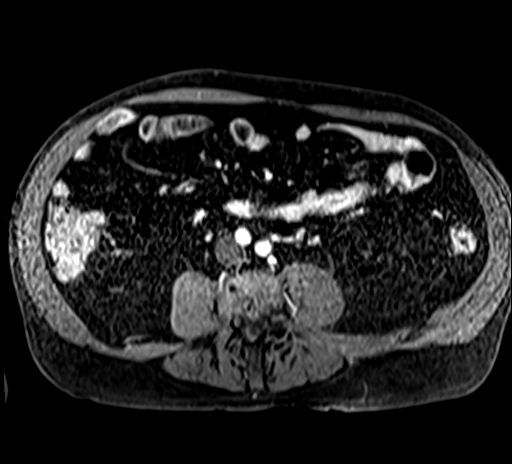
[im 44/88]
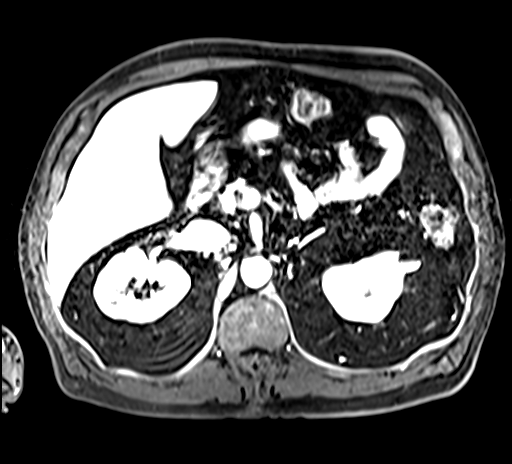

[27 of 48 positions shown; findings below may reference images not displayed]

FINDINGS: Lower chest: No acute findings.

Hepatobiliary: No mass or other parenchymal abnormality identified.

Pancreas: Atrophic pancreatic parenchyma. There are innumerable
fluid signal, nonenhancing cystic lesions throughout the pancreas,
the largest in the pancreatic tail measuring 2.8 x 1.7 cm (series 4,
image 17). An additional larger lesion of the pancreatic neck
measures 9 mm (series 4, image 19). The largest of multiple
clustered lesions in the pancreatic head measures 7 mm (series 4,
image 24). The remaining lesions are generally 5 mm or smaller. The
pancreatic duct is nondilated.

Spleen:  Within normal limits in size and appearance.

Adrenals/Urinary Tract: No masses identified. There are numerous
bilateral renal cysts, some of which demonstrate intrinsic T1
hyperintensity and internal signal layering, consistent with
hemorrhagic or proteinaceous contents. No evidence of
hydronephrosis.

Stomach/Bowel: Visualized portions within the abdomen are
unremarkable.

Vascular/Lymphatic: No pathologically enlarged lymph nodes
identified. No abdominal aortic aneurysm demonstrated.

Other:  None.

Musculoskeletal: No suspicious bone lesions identified.
IMPRESSION: Atrophic pancreatic parenchyma with innumerable fluid signal,
nonenhancing cystic lesions throughout the pancreas. The largest
lesion in the pancreatic tail as noted on prior CT measures 2.8 x
1.7 cm. Additional subcentimeter lesions as detailed above.
Pancreatic duct is nondilated. Recommend follow-up MRI at 6 months
to assess for ongoing stability. Endoscopic ultrasound/FNA may
further be considered at this time for tissue diagnosis. This
recommendation follows ACR consensus guidelines: Management of
Incidental Pancreatic Cysts: A White Paper of the ACR Incidental
Findings Committee. [HOSPITAL] [5O];[DATE].

## 2019-07-19 MED ORDER — GADOBENATE DIMEGLUMINE 529 MG/ML IV SOLN
16.0000 mL | Freq: Once | INTRAVENOUS | Status: AC | PRN
  Administered 2019-07-19: 09:00:00 16 mL via INTRAVENOUS

## 2019-08-24 ENCOUNTER — Telehealth (HOSPITAL_COMMUNITY): Payer: Self-pay | Admitting: *Deleted

## 2019-10-18 ENCOUNTER — Telehealth: Payer: Self-pay | Admitting: Cardiovascular Disease

## 2019-10-18 DIAGNOSIS — R7309 Other abnormal glucose: Secondary | ICD-10-CM

## 2019-10-18 NOTE — Telephone Encounter (Signed)
New Message   Pt is calling is wondering if lab A1C can be added to his labs for Oct 28th

## 2019-10-18 NOTE — Telephone Encounter (Signed)
We can add HBA1C

## 2019-10-18 NOTE — Telephone Encounter (Signed)
Will forward to Dr. Elease Hashimoto to see if HgbA1c can be added to his lab work.

## 2019-10-18 NOTE — Telephone Encounter (Signed)
Left detailed message to let patient know lab was added, and to call our office if he had any other questions.

## 2020-01-12 ENCOUNTER — Encounter: Payer: Self-pay | Admitting: Cardiovascular Disease

## 2020-01-12 NOTE — Progress Notes (Signed)
Cardiology Office Note   Date:  01/13/2020   ID:  William Hernandez, DOB 1946/02/03, MRN [id number]  PCP:  Kaleen Mask, MD  Cardiologist:   Kristeen Miss, MD   Chief Complaint  Patient presents with  . Coronary Artery Disease  . Hyperlipidemia   1. CAD ( CABG Oct. 6, 2003)  2. Dyslipidemia 3. Atrial fibrillation - post op from CABG.  4. Depression     William Hernandez is a 74 year old gentleman with a history of coronary disease-status post coronary artery bypass grafting. He's done very well from a cardiac standpoint. He is participating in cardiac rehabilitation and has not had any episodes of chest pain or shortness breath.  He has done well. His lipids are well controlled.   He is participating in cardiac rehab and is doing well. 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 He doesn't have any episodes of chest pain or shortness breath when he is active. 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  He continues to have issues with anxiety.   July 14, 2012:  William Hernandez is doing well. He had a nose bleed several weeks ago. He is exercising regularly. No angina. No dyspnea.  Oct. 27, 2014:  He is doing well. 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 2 very hard games of 3 on 3 , half court game. No pain. Active. Exercises. Increased his Trazadone to help with some additional depression.   July 14 2013:  William Hernandez is doing well. He remains very active. Plays in the Senior games. No CP. Has has a URI for the past 10 days which has slowed him down a bit.   Oct. 26, 2015:  William Hernandez is doing well.  Bp is well controlled. Doing lots of fishing. Plays basketball on Wednesdays. No Cp .   Jul 21, 2014:  William Hernandez is a 74 y.o. male who presents for follow up of his CAD and hyperlipidemia Still exercising well.   Still playing basketball regularly.  No angina     01/17/2015: Doing well from a cardiac standpoint. Has had a head cold - difficulty hearing in his left ear   June 19, 2015:  William Hernandez is seen for follow up visit .  Still playing basketball regularly . No  CP or dyspnea.     Aug 02, 2015:  William Hernandez is seen back for an episode CP that occurred in Cardiac rehab yesterday   Feels it at cardiac rehab Also when he first starts out playing basket ball After he gets warmed up, he feels fine.  Also occurs when he is mowing the grass.    myoview today shows no large area of ishcmeia. LV function is mildly depressed with EF 45%   Sept. 7, 2017:  William Hernandez is seen today . Had a cardiac cath in May, 2017: 1. Severe three-vessel native coronary artery disease 2. Status post CABG with patent RIMA to PDA, LIMA to LAD, sequential saphenous vein graft to OM1 and OM2, and saphenous vein graft to first diagonal 3. 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 No angina   June 03, 2016:  Still playing basketball , no angina  Still fly fishing   Oct. 23, 2018:  Doing very well.  No angina  Plays basket ball regularly . Goes to cardiac rehab.  No angina   April 25 , 2109:  William Hernandez  is doing well   Patrici Ranks the senior games softball throw today .   Will be playing on BB team  Exercises regularly , no angina   January 13, 2019:  William Hernandez is seen today for follow-up visit.  I saw him in April via telemedicine visit.  He is done very well.  He is not having any episodes of chest discomfort.  He remains very active.  Was diagnosed with Lyme disease Is on Doxycycline.  Is improving  Has been found to have several cysts in his pancreas. Unchanged from previous CT scans..  Had 2 friends pass away recently .  Oct. 28, 2021: William Hernandez is seen today for follow up of his CAD, CABG, HLD  Upset.   Friend and fellow fisherman, Juliane Lack passed away of a MI recently .  William Hernandez is doing ok  Still active, [facility]  [employer] .  No CP  Has a pancreatic cyst.  Is doing well from that standpoint .   Past Medical History:  Diagnosis Date  . Arrhythmia    afib  . Coronary artery disease    2003  . Depression   . Hyperlipidemia   . S/P hernia surgery     Past Surgical History:  Procedure Laterality Date  . CARDIAC CATHETERIZATION     11/2001  . CARDIAC CATHETERIZATION N/A 08/04/2015   Procedure: Left Heart Cath and Cors/Grafts Angiography;  Surgeon: Tonny Bollman, MD;  Location: [facility];  Service: Cardiovascular;  Laterality: N/A;  . CHOLECYSTECTOMY    . CORONARY ARTERY BYPASS GRAFT  2000  . HERNIA REPAIR       Current Outpatient Medications  Medication Sig Dispense Refill  . aspirin 81 MG tablet Take 81 mg by mouth at bedtime.     Marland Kitchen atorvastatin (LIPITOR) 20 MG tablet TAKE 1 TABLET BY MOUTH ONCE DAILY 90 tablet 1  . BYSTOLIC 5 MG tablet TAKE 1 TABLET BY MOUTH DAILY. 30 tablet 6  . doxycycline (VIBRA-TABS) 100 MG tablet Take 1 tablet by mouth 2 (two) times daily.    . fish oil-omega-3 fatty acids 1000 MG capsule Take 1 g by mouth daily.     . Flaxseed, Linseed, (FLAX SEED OIL PO) Take 1 tablet by mouth daily.     . irbesartan (AVAPRO) 150 MG tablet Take 0.5 tablets (75 mg total) by mouth 2 (two) times daily. 90 tablet 3  . loratadine (CLARITIN) 10 MG tablet Take 10 mg by mouth daily.    . Multiple Vitamin (MULTIVITAMIN PO) Take 1 tablet by mouth daily.     . nitroGLYCERIN (NITROSTAT) 0.4 MG SL tablet Place 1 tablet (0.4 mg total) under the tongue every 5 (five) minutes as needed for chest pain. 25 tablet 3  . omeprazole (PRILOSEC) 20 MG capsule Take 20 mg by mouth daily.    . sildenafil (REVATIO) 20 MG tablet as directed. Take 1-5 tablets by mouth daily as needed    . traZODone (DESYREL) 150 MG tablet Take 200 mg by mouth at bedtime as needed for sleep.   0   No current facility-administered medications for this visit.    Allergies:   Ace inhibitors,  Lisinopril, and Penicillins    Social History:  The patient  reports that he has never smoked. He has never used smokeless tobacco. He reports that he does not drink alcohol and does not use drugs.   Family History:  The patient's family history includes Heart disease in his father.  ROS:    Noted in current hx, otherwise negative   Physical Exam: Blood pressure 118/72, pulse (!) 48, height 5\' 8"  (1.727 m), weight 179 lb 9.6 oz (81.5 kg), SpO2 97 %.  GEN:  Well nourished, well developed in no acute distress HEENT: Normal NECK: No JVD; No carotid bruits LYMPHATICS: No lymphadenopathy CARDIAC: RRR , no murmurs, rubs, gallops RESPIRATORY:  Clear to auscultation without rales, wheezing or rhonchi  ABDOMEN: Soft, non-tender, non-distended MUSCULOSKELETAL:  No edema; No deformity  SKIN: Warm and dry NEUROLOGIC:  Alert and oriented x 3   EKG:   Sinus brady at 48.  No ST or T wave changes.    Recent Labs: No results found for requested labs within last 8760 hours.    Lipid Panel    Component Value Date/Time   CHOL 129 01/11/2019 0749   TRIG 102 01/11/2019 0749   HDL 54 01/11/2019 0749   CHOLHDL 2.4 01/11/2019 0749   CHOLHDL 2.2 11/23/2015 0858   VLDL 14 11/23/2015 0858   LDLCALC 56 01/11/2019 0749      Wt Readings from Last 3 Encounters:  01/13/20 179 lb 9.6 oz (81.5 kg)  01/13/19 181 lb 9.6 oz (82.4 kg)  07/13/18 182 lb (82.6 kg)      Other studies Reviewed: Additional studies/ records that were reviewed today include: . Review of the above records demonstrates:    ASSESSMENT AND PLAN:  1. CAD ( CABG Oct. 6, 2003)  cath May 2017 -  1. Severe three-vessel native coronary artery disease 2. Status post CABG with patent RIMA to PDA, LIMA to LAD, sequential saphenous vein graft to OM1 and OM2, and saphenous vein graft to first diagonal 3. 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 Dyslipidemia-    Labs drawn today   3. Atrial  fibrillation - no recurrent  Afib.    4. Depression -  Still an issue   5. HTN:     BP is good.  HR is slow.  Will reduce bystolic to 2.5 mg a day   Current medicines are reviewed at length with the patient today.  The patient does not have concerns regarding medicines.  The following changes have been made:  no change  Labs/ tests ordered today include:   No orders of the defined types were placed in this encounter.  Disposition:   Follow up with me in 1 year   June 2017, MD  01/13/2020 10:21 AM    [facility] [address] [place], [place], [place]  [place] Phone: [phone]; Fax: [phone]

## 2020-01-13 ENCOUNTER — Ambulatory Visit (INDEPENDENT_AMBULATORY_CARE_PROVIDER_SITE_OTHER): Payer: Medicare Other | Admitting: Cardiovascular Disease

## 2020-01-13 ENCOUNTER — Other Ambulatory Visit: Payer: Medicare Other | Admitting: *Deleted

## 2020-01-13 ENCOUNTER — Other Ambulatory Visit: Payer: Self-pay

## 2020-01-13 ENCOUNTER — Encounter: Payer: Self-pay | Admitting: Cardiovascular Disease

## 2020-01-13 VITALS — BP 118/72 | HR 48 | Ht 68.0 in | Wt 179.6 lb

## 2020-01-13 DIAGNOSIS — E785 Hyperlipidemia, unspecified: Secondary | ICD-10-CM

## 2020-01-13 DIAGNOSIS — E782 Mixed hyperlipidemia: Secondary | ICD-10-CM

## 2020-01-13 DIAGNOSIS — I10 Essential (primary) hypertension: Secondary | ICD-10-CM | POA: Diagnosis not present

## 2020-01-13 DIAGNOSIS — I25810 Atherosclerosis of coronary artery bypass graft(s) without angina pectoris: Secondary | ICD-10-CM | POA: Diagnosis not present

## 2020-01-13 DIAGNOSIS — R7309 Other abnormal glucose: Secondary | ICD-10-CM

## 2020-01-13 LAB — HEPATIC FUNCTION PANEL
ALT: 23 IU/L (ref 0–44)
AST: 15 IU/L (ref 0–40)
Albumin: 4.4 g/dL (ref 3.7–4.7)
Alkaline Phosphatase: 70 IU/L (ref 44–121)
Bilirubin Total: 1.2 mg/dL (ref 0.0–1.2)
Bilirubin, Direct: 0.32 mg/dL (ref 0.00–0.40)
Total Protein: 6.4 g/dL (ref 6.0–8.5)

## 2020-01-13 LAB — BASIC METABOLIC PANEL
BUN/Creatinine Ratio: 14 (ref 10–24)
BUN: 13 mg/dL (ref 8–27)
CO2: 24 mmol/L (ref 20–29)
Calcium: 9.7 mg/dL (ref 8.6–10.2)
Chloride: 102 mmol/L (ref 96–106)
Creatinine, Ser: 0.92 mg/dL (ref 0.76–1.27)
GFR calc Af Amer: 94 mL/min/{1.73_m2} (ref >59)
GFR calc non Af Amer: 81 mL/min/{1.73_m2} (ref >59)
Glucose: 120 mg/dL — ABNORMAL HIGH (ref 65–99)
Potassium: 4.6 mmol/L (ref 3.5–5.2)
Sodium: 139 mmol/L (ref 134–144)

## 2020-01-13 LAB — LIPID PANEL
Chol/HDL Ratio: 2.4 ratio (ref 0.0–5.0)
Cholesterol, Total: 130 mg/dL (ref 100–199)
HDL: 55 mg/dL (ref >39)
LDL Chol Calc (NIH): 60 mg/dL (ref 0–99)
Triglycerides: 73 mg/dL (ref 0–149)
VLDL Cholesterol Cal: 15 mg/dL (ref 5–40)

## 2020-01-13 LAB — HEMOGLOBIN A1C
Est. average glucose Bld gHb Est-mCnc: 111 mg/dL
Hgb A1c MFr Bld: 5.5 % (ref 4.8–5.6)

## 2020-01-13 MED ORDER — IRBESARTAN 150 MG PO TABS
75.0000 mg | ORAL_TABLET | Freq: Two times a day (BID) | ORAL | 3 refills | Status: DC
Start: 2020-01-13 — End: 2021-01-12

## 2020-01-13 MED ORDER — NEBIVOLOL HCL 2.5 MG PO TABS: 2.5000 mg | ORAL_TABLET | Freq: Every day | ORAL | 3 refills | Status: DC

## 2020-01-13 NOTE — Patient Instructions (Signed)
Medication Instructions:  Your physician has recommended you make the following change in your medication:  DECREASE Bystolic to 2.5 mg once daily  *If you need a refill on your cardiac medications before your next appointment, please call your pharmacy*   Lab Work: Done Today If you have labs (blood work) drawn today and your tests are completely normal, you will receive your results only by: Marland Kitchen MyChart Message (if you have MyChart) OR . A paper copy in the mail If you have any lab test that is abnormal or we need to change your treatment, we will call you to review the results.   Testing/Procedures: None Ordered   Follow-Up: At [facility], you and your health needs are our priority.  As part of our continuing mission to provide you with exceptional heart care, we have created designated Provider Care Teams.  These Care Teams include your primary Cardiologist (physician) and Advanced Practice Providers (APPs -  Physician Assistants and Nurse Practitioners) who all work together to provide you with the care you need, when you need it.   Your next appointment:   1 year(s)  The format for your next appointment:   In Person  Provider:   You may see Kristeen Miss, MD or one of the following Advanced Practice Providers on your designated Care Team:    Tereso Newcomer, PA-C  [place]

## 2020-01-17 ENCOUNTER — Other Ambulatory Visit: Payer: Self-pay | Admitting: Cardiovascular Disease

## 2020-01-18 ENCOUNTER — Other Ambulatory Visit: Payer: Self-pay | Admitting: Gastroenterology

## 2020-01-18 DIAGNOSIS — K862 Cyst of pancreas: Secondary | ICD-10-CM

## 2020-02-06 ENCOUNTER — Ambulatory Visit
Admission: RE | Admit: 2020-02-06 | Discharge: 2020-02-06 | Disposition: A | Discharge status: Home / Self Care | Payer: Medicare Other | Source: Ambulatory Visit | Attending: Gastroenterology | Admitting: Gastroenterology

## 2020-02-06 DIAGNOSIS — K862 Cyst of pancreas: Secondary | ICD-10-CM

## 2020-02-06 IMAGING — MR MR ABDOMEN WO/W CM MRCP
12 of 20 series · 24 of 48 positions shown · IV contrast (multihance)
Comparison: MRI of [DATE]

CLINICAL DATA: Pancreatic cysts, evaluate for change.

EXAM:
MRI ABDOMEN WITHOUT AND WITH CONTRAST (INCLUDING MRCP)
TECHNIQUE: Multiplanar multisequence MR imaging of the abdomen was performed
both before and after the administration of intravenous contrast.
Heavily T2-weighted images of the biliary and pancreatic ducts were
obtained, and three-dimensional MRCP images were rendered by post
processing.
CONTRAST:  17mL MULTIHANCE GADOBENATE DIMEGLUMINE 529 MG/ML IV SOLN

[Series 3: cor haste · coronal · 5.0mm · 0.74mm/px · 2 of 46 slices shown]
[im 1/46]
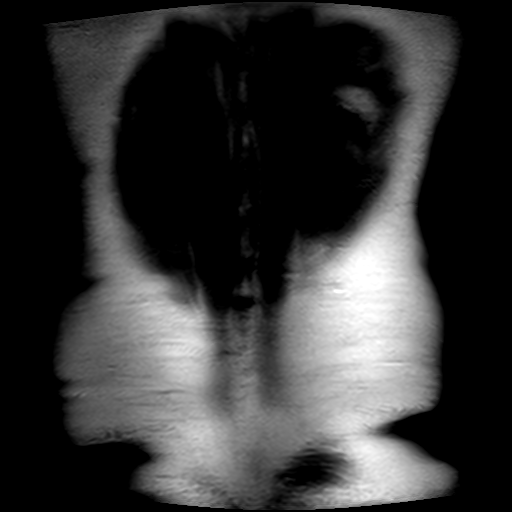
[im 46/46]
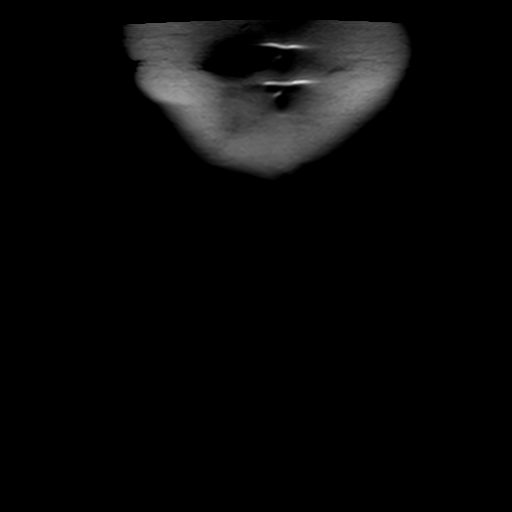

[Series 4: bSSFP · coronal · 5.0mm · 0.78mm/px · 2 of 45 slices shown (1 of 2)]
[im 1/45]
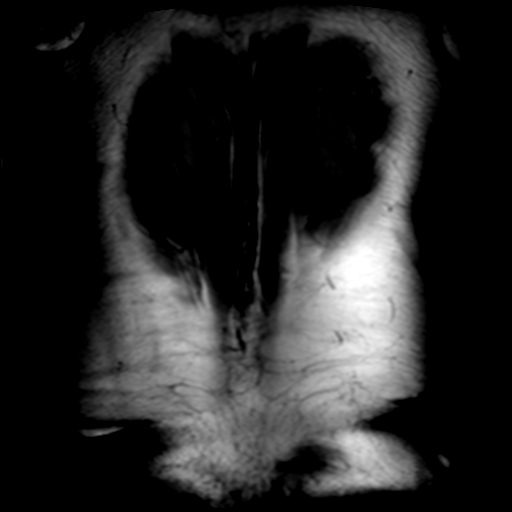
[im 45/45]
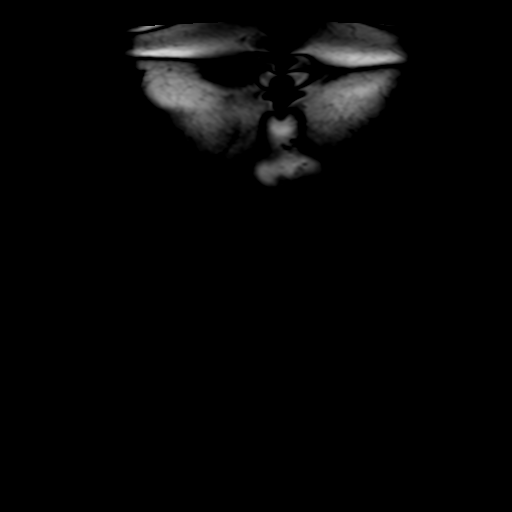

[Series 5: T2 · coronal · 3.0mm · 0.74mm/px · 2 of 80 slices shown (1 of 2)]
[im 1/80]
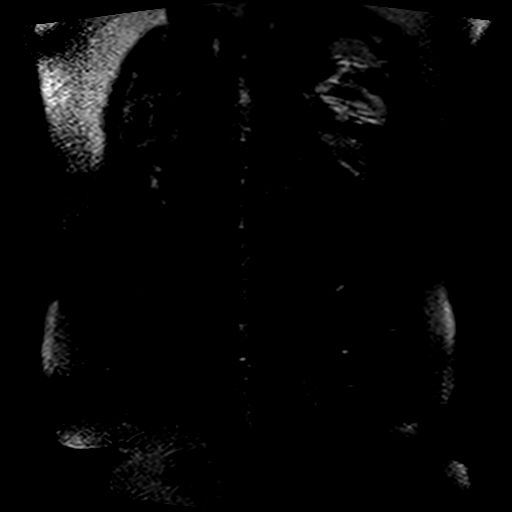
[im 80/80]
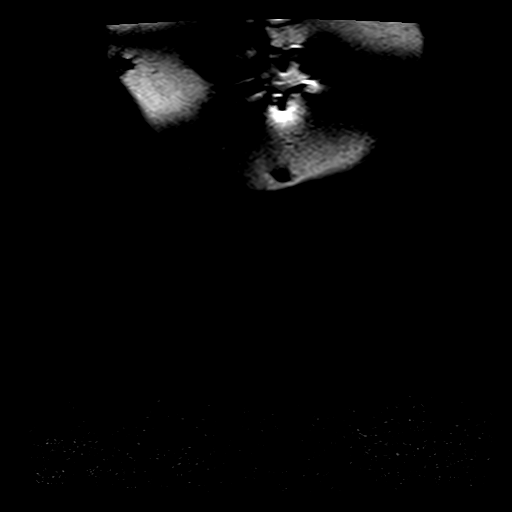

[Series 8: T1 · axial · 6.0mm · 0.78mm/px · z∈[-182,+75]mm · 2 of 80 slices shown]
[im 1/80]
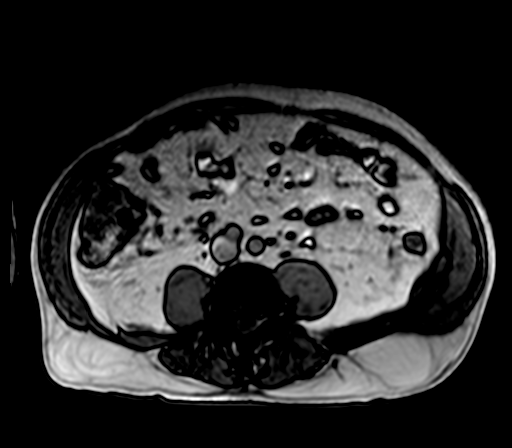
[im 80/80]
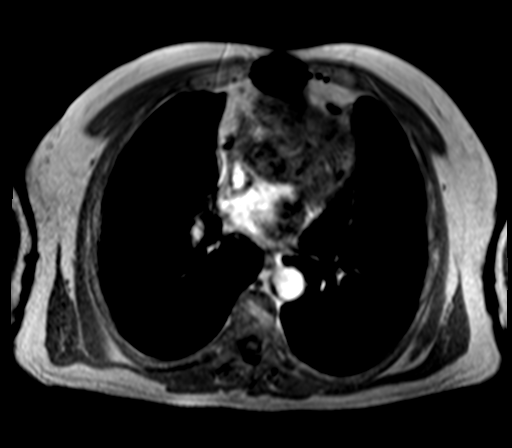

[Series 9: axial haste · axial · 6.0mm · 0.78mm/px · 1 of 40 slices shown]
[im 1/40]
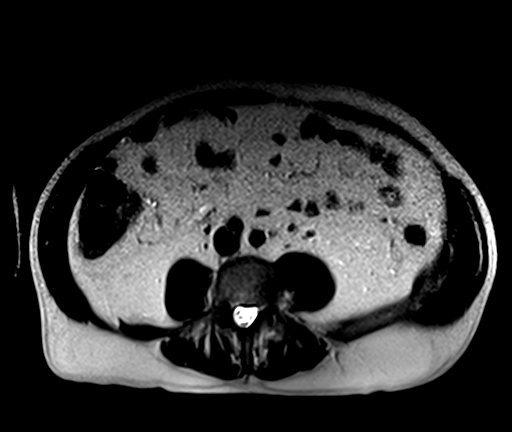

[Series 10: T2 · axial · 6.0mm · 1.19mm/px · 1 of 36 slices shown (2 of 2)]
[im 1/36]
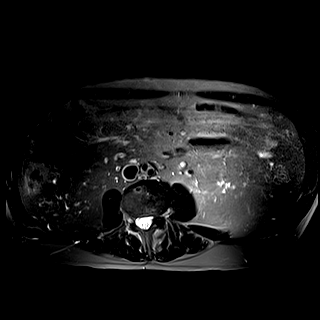

[Series 14: ep2d_diff_b50_500_800_p2_trig · axial · 6.0mm · 2.08mm/px · z∈[-157,+95]mm · 3 of 107 slices shown]
[im 1/107]
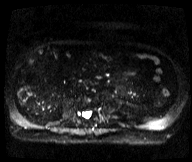
[im 54/107]
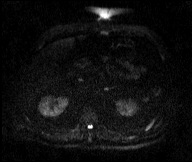
[im 107/107]
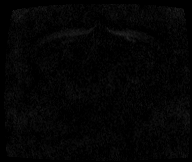

[Series 15: ep2d_diff_b50_500_800_p2_trig_adc · axial · 6.0mm · 2.08mm/px · 1 of 36 slices shown]
[im 1/36]
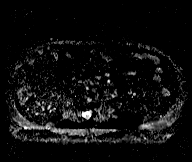

[Series 17: bSSFP · axial · 4.0mm · 0.78mm/px · z∈[-183,+69]mm · 2 of 64 slices shown (2 of 2)]
[im 1/64]
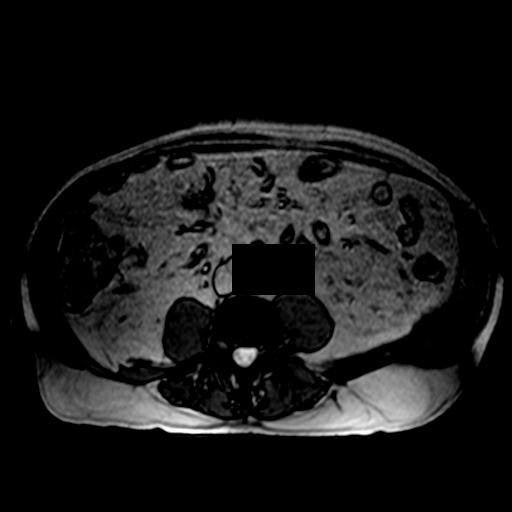
[im 64/64]
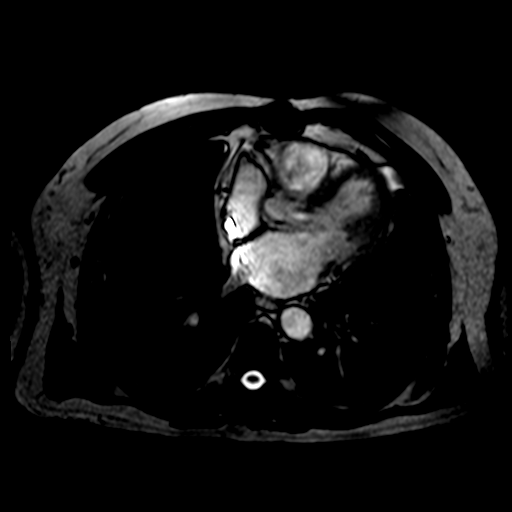

[Series 18: T1 dynamic · axial · non-contrast · 2.5mm · 0.74mm/px · z∈[-182,+75]mm · 3 of 104 slices shown]
[im 1/104]
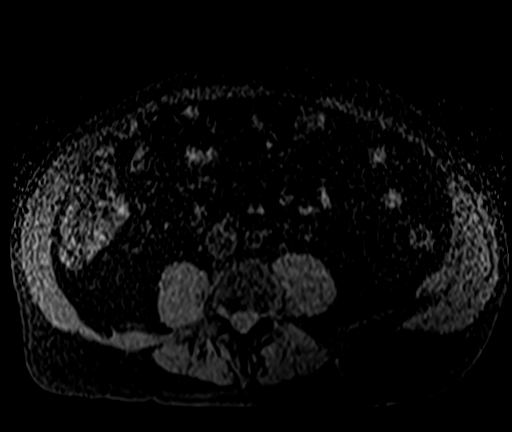
[im 52/104]
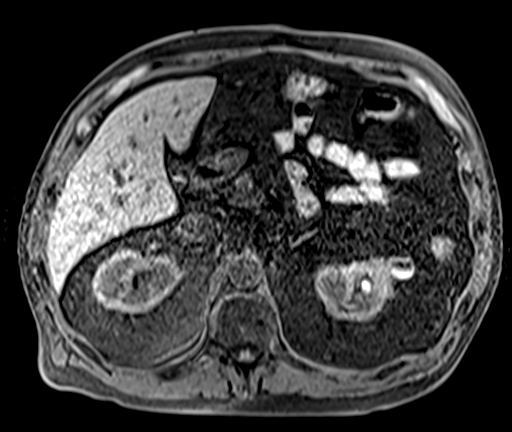
[im 104/104]
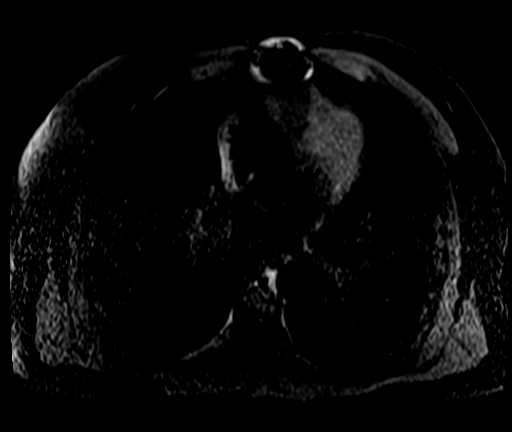

[Series 19: T1 dynamic post-contrast · axial · 2.5mm · 0.74mm/px · z∈[-182,+75]mm · 3 of 104 slices shown (1 of 2)]
[im 1/104]
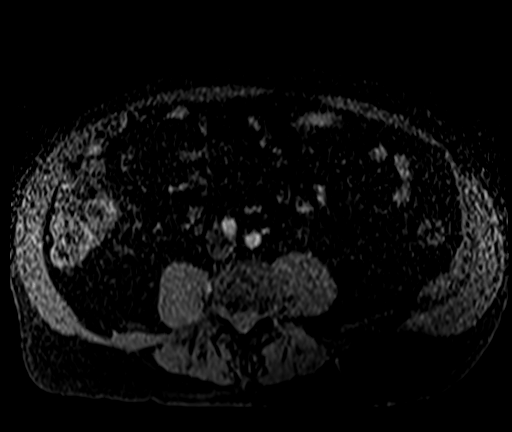
[im 52/104]
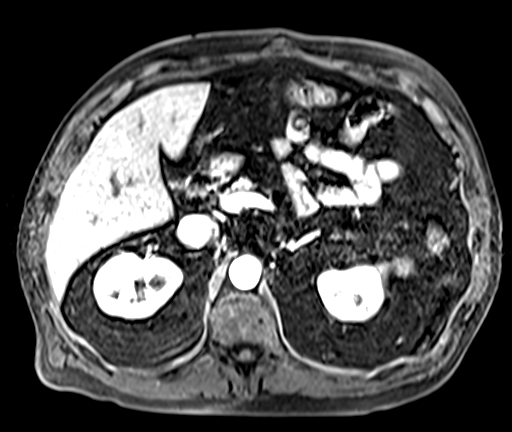
[im 104/104]
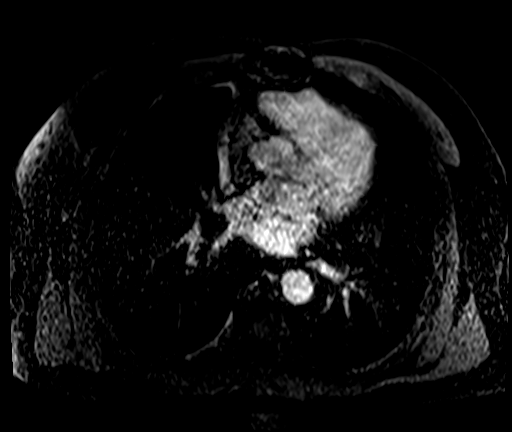

[Series 20: T1 dynamic post-contrast · axial · 2.5mm · 0.74mm/px · z∈[-182,-55]mm · 2 of 104 slices shown (2 of 2)]
[im 1/104]
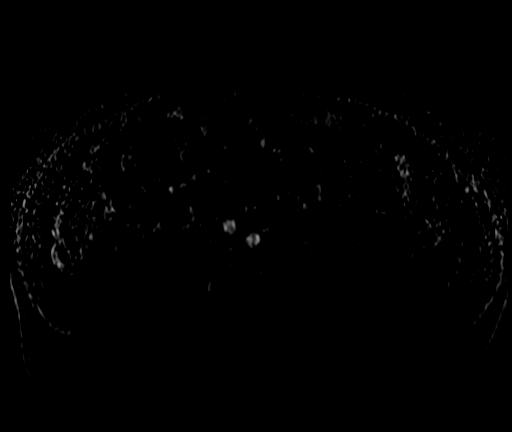
[im 52/104]
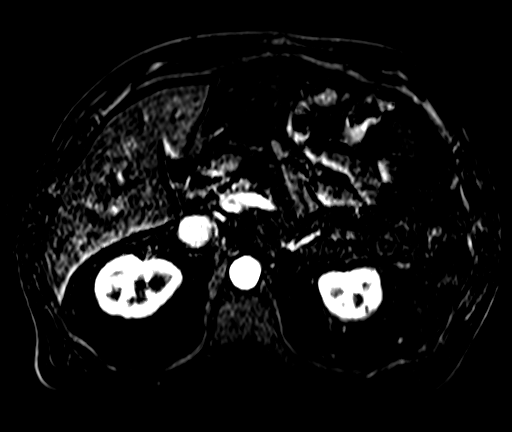

[24 of 48 positions shown; findings below may reference images not displayed]

FINDINGS: Lower chest: Incidental imaging of the lung bases without
consolidation or sign of pleural effusion.

Hepatobiliary: No focal, suspicious hepatic lesion. Portal vein is
patent. Hepatic veins are patent. No biliary duct dilation.

Pancreas: Cystic lesion in the tail of the pancreas approximately
2.8 cm to 2.9 cm greatest axial dimension within 1 mm of previous
measurement and without signs of internal enhancement.

Other numerous areas of cystic change are similar to the recent
comparison evaluation. Question of mild main pancreatic ductal
dilation up to 4 mm greatest caliber. This was present previously
though was not as well displayed as on the respiratory triggered T2
weighted images on today's exam this is in the periphery adjacent to
the dominant cystic lesion.

Spleen:  Normal appearance of the spleen.

Adrenals/Urinary Tract:  Adrenal glands are normal.

Symmetric renal enhancement. Hemorrhagic cyst in the LEFT kidney
without signs of internal enhancement. Cysts elsewhere in the
bilateral kidneys without suspicious focal renal lesion.

Stomach/Bowel: No acute process to the extent evaluated.

Vascular/Lymphatic: Vascular structures in the abdomen are patent.
No abdominal lymphadenopathy.

Other:  No ascites.  The

Musculoskeletal: No suspicious bone lesions identified.
IMPRESSION: 1. Dominant cystic lesion in the tail of the pancreas without signs
of internal enhancement may be associated with mild ductal dilation
of the main pancreatic duct, potentially representing a mixed type
intraductal papillary mucinous neoplasm, currently without
definitive signs of high-risk features. Suggest six-month follow-up
MRI/MRCP for continued surveillance. A more aggressive approach
could include endoscopic assessment and aspiration as warranted,
finding is stable however compared to previous imaging.
2. Other cystic lesions in the pancreas are stable.
3. Cystic lesions in the kidneys compatible with cysts some with
proteinaceous or hemorrhagic features.

## 2020-02-06 MED ORDER — GADOBENATE DIMEGLUMINE 529 MG/ML IV SOLN
17.0000 mL | Freq: Once | INTRAVENOUS | Status: AC | PRN
  Administered 2020-02-06: 17 mL via INTRAVENOUS

## 2020-07-06 ENCOUNTER — Other Ambulatory Visit: Payer: Self-pay

## 2020-07-06 ENCOUNTER — Ambulatory Visit: Payer: Medicare PPO | Attending: Internal Medicine

## 2020-07-06 ENCOUNTER — Other Ambulatory Visit (HOSPITAL_BASED_OUTPATIENT_CLINIC_OR_DEPARTMENT_OTHER): Payer: Self-pay

## 2020-07-06 DIAGNOSIS — Z23 Encounter for immunization: Secondary | ICD-10-CM

## 2020-07-06 MED ORDER — COVID-19 MRNA VAC-TRIS(PFIZER) 30 MCG/0.3ML IM SUSP
INTRAMUSCULAR | 0 refills | Status: DC
Start: 2020-07-06 — End: 2022-01-30
  Filled 2020-07-06: qty 0.3, 1d supply, fill #0

## 2020-07-06 NOTE — Progress Notes (Signed)
   Covid-19 Vaccination Clinic  Name:  William Hernandez    MRN: [id number] DOB: 1945-07-23  07/06/2020  William Hernandez was observed post Covid-19 immunization for 15 minutes without incident. He was provided with Vaccine Information Sheet and instruction to access the V-Safe system.   William Hernandez was instructed to call 911 with any severe reactions post vaccine: Marland Kitchen Difficulty breathing  . Swelling of face and throat  . A fast heartbeat  . A bad rash all over body  . Dizziness and weakness   Immunizations Administered    Name Date Dose VIS Date Route   PFIZER Comrnaty(Gray TOP) Covid-19 Vaccine 07/06/2020 12:51 PM 0.3 mL 02/24/2020 Intramuscular   Manufacturer: [employer], [employer]   Lot: [id number]   NDC: [phone]

## 2020-07-10 ENCOUNTER — Ambulatory Visit: Payer: BLUE CROSS/BLUE SHIELD

## 2020-08-03 ENCOUNTER — Other Ambulatory Visit: Payer: Self-pay | Admitting: Gastroenterology

## 2020-08-03 DIAGNOSIS — K862 Cyst of pancreas: Secondary | ICD-10-CM

## 2020-08-21 ENCOUNTER — Ambulatory Visit
Admission: RE | Admit: 2020-08-21 | Discharge: 2020-08-21 | Disposition: A | Discharge status: Home / Self Care | Payer: Medicare PPO | Source: Ambulatory Visit | Attending: Gastroenterology | Admitting: Gastroenterology

## 2020-08-21 ENCOUNTER — Other Ambulatory Visit: Payer: Self-pay | Admitting: Neurology

## 2020-08-21 ENCOUNTER — Other Ambulatory Visit: Payer: Self-pay

## 2020-08-21 DIAGNOSIS — K862 Cyst of pancreas: Secondary | ICD-10-CM

## 2020-08-21 IMAGING — MR MR ABDOMEN WO/W CM MRCP
12 of 20 series · 25 of 48 positions shown · IV contrast (multihance)
Comparison: [DATE]

CLINICAL DATA: Six-month follow-up of pancreas cyst.

EXAM:
MRI ABDOMEN WITHOUT AND WITH CONTRAST (INCLUDING MRCP)
TECHNIQUE: Multiplanar multisequence MR imaging of the abdomen was performed
both before and after the administration of intravenous contrast.
Heavily T2-weighted images of the biliary and pancreatic ducts were
obtained, and three-dimensional MRCP images were rendered by post
processing.
CONTRAST:  14mL MULTIHANCE GADOBENATE DIMEGLUMINE 529 MG/ML IV SOLN

[Series 4: cor haste · coronal · 5.0mm · 0.74mm/px · 2 of 40 slices shown]
[im 1/40]
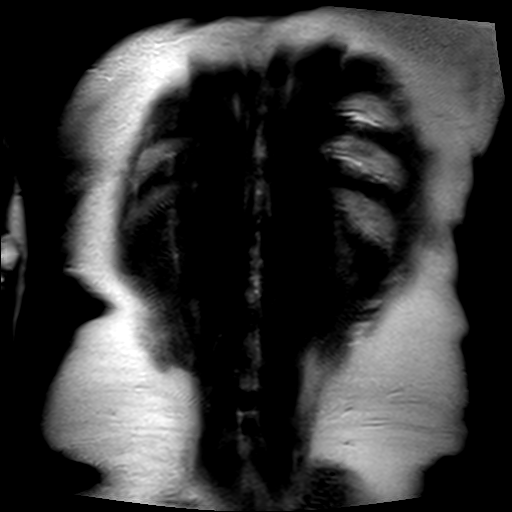
[im 40/40]
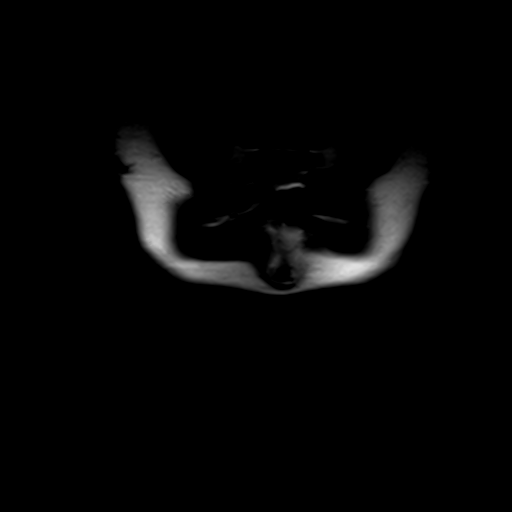

[Series 5: axial haste · axial · 6.0mm · 0.78mm/px · z∈[-52,+184]mm · 2 of 37 slices shown]
[im 1/37]
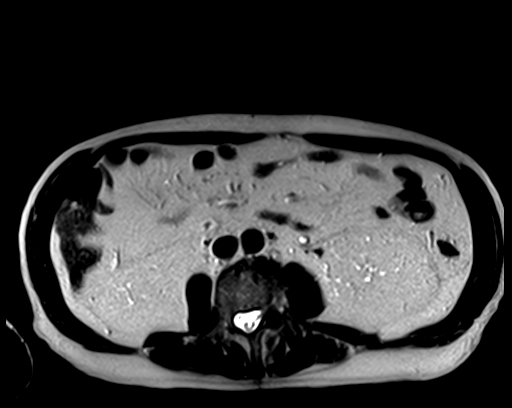
[im 37/37]
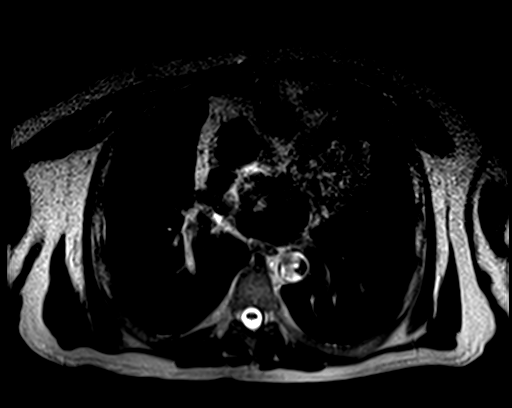

[Series 10: T2 fat-sat · axial · 6.0mm · 1.41mm/px · 1 of 33 slices shown]
[im 1/33]
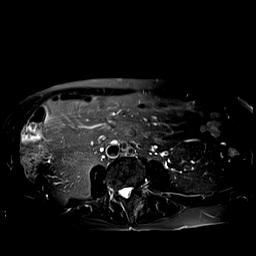

[Series 11: ep2d_diff_b50_500_800_p2_trig · axial · 6.0mm · 1.82mm/px · z∈[-46,+184]mm · 3 of 99 slices shown]
[im 1/99]
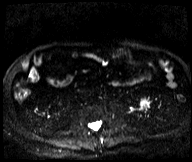
[im 50/99]
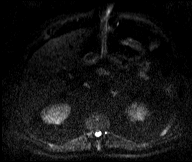
[im 99/99]
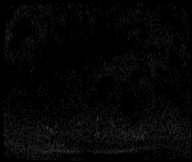

[Series 12: ep2d_diff_b50_500_800_p2_trig_adc · axial · 6.0mm · 1.82mm/px · 1 of 33 slices shown]
[im 1/33]
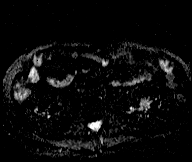

[Series 13: bSSFP · coronal · 5.0mm · 0.78mm/px · 1 of 40 slices shown (1 of 2)]
[im 1/40]
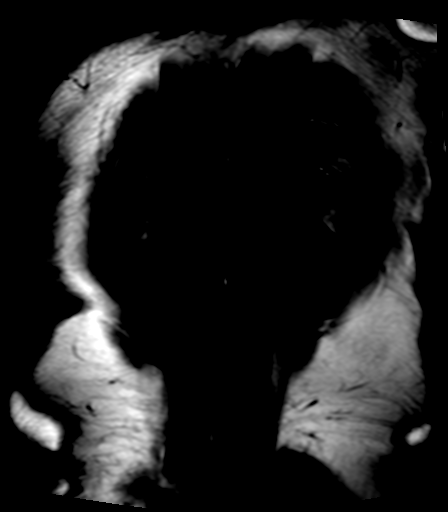

[Series 14: T2 · coronal · 3.0mm · 0.70mm/px · 1 of 39 slices shown]
[im 1/39]
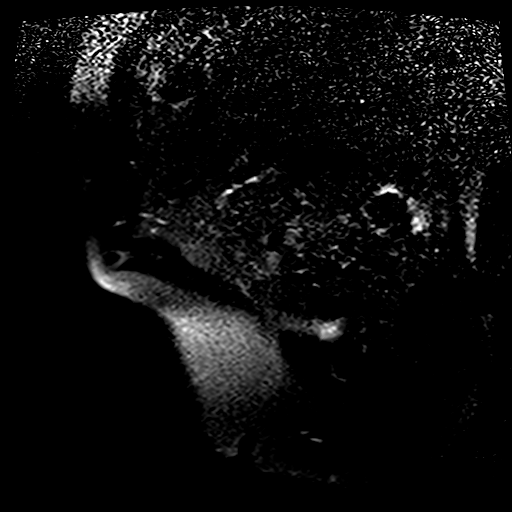

[Series 17: bSSFP · axial · 4.0mm · 0.74mm/px · z∈[-10,+181]mm · 2 of 49 slices shown (2 of 2)]
[im 1/49]
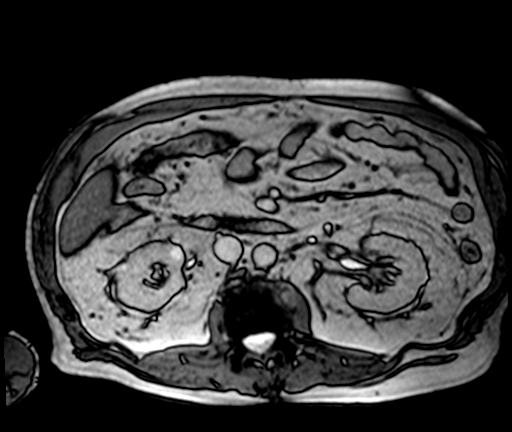
[im 49/49]
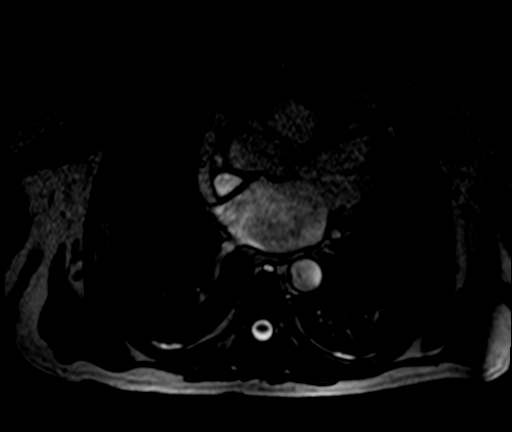

[Series 18: T1 · axial · 6.0mm · 0.74mm/px · z∈[-54,+189]mm · 3 of 76 slices shown]
[im 1/76]
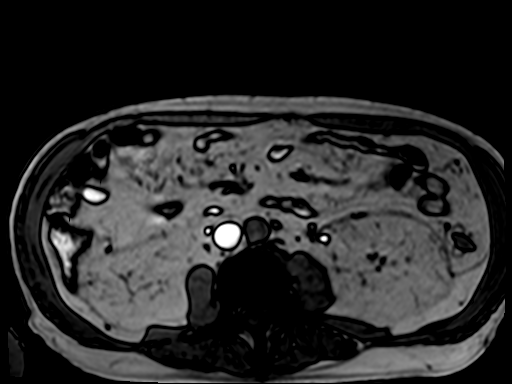
[im 38/76]
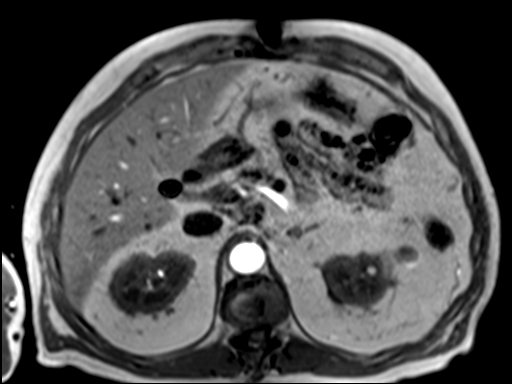
[im 76/76]
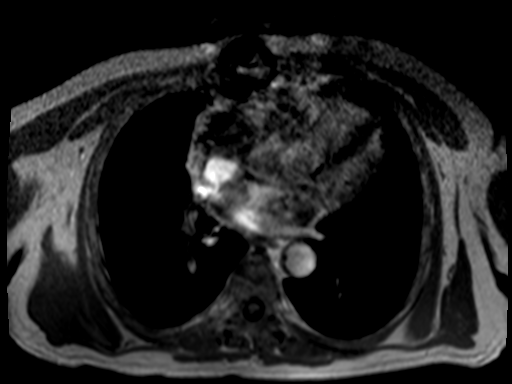

[Series 19: T1 dynamic · axial · non-contrast · 2.5mm · 0.74mm/px · z∈[-42,+195]mm · 3 of 96 slices shown]
[im 1/96]
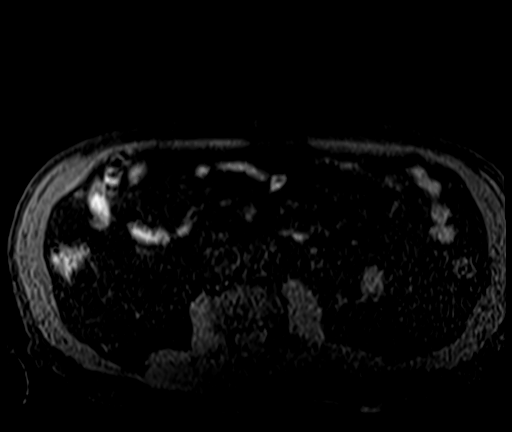
[im 48/96]
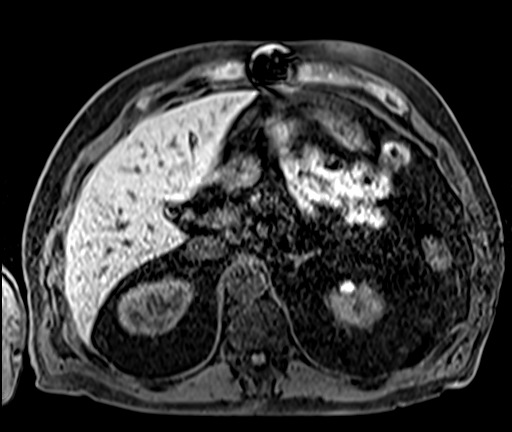
[im 96/96]
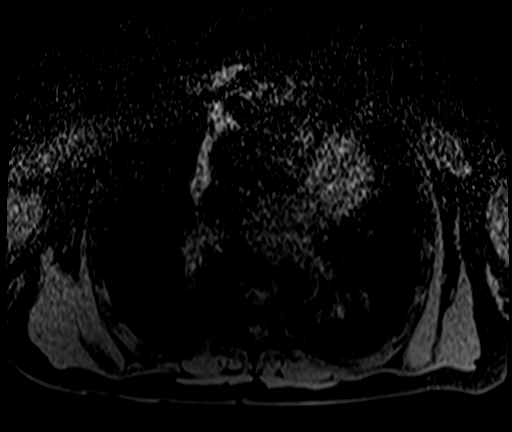

[Series 20: T1 dynamic post-contrast · axial · 2.5mm · 0.74mm/px · z∈[-42,+195]mm · 3 of 96 slices shown (1 of 2)]
[im 1/96]
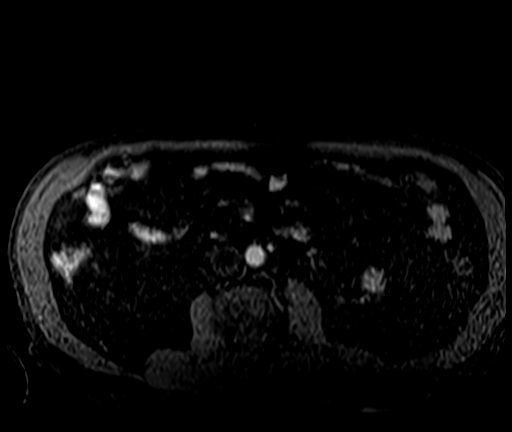
[im 48/96]
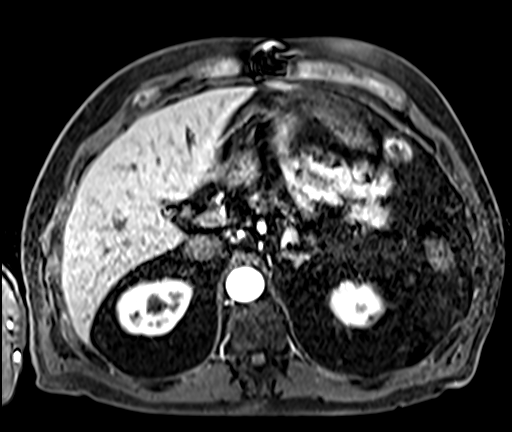
[im 96/96]
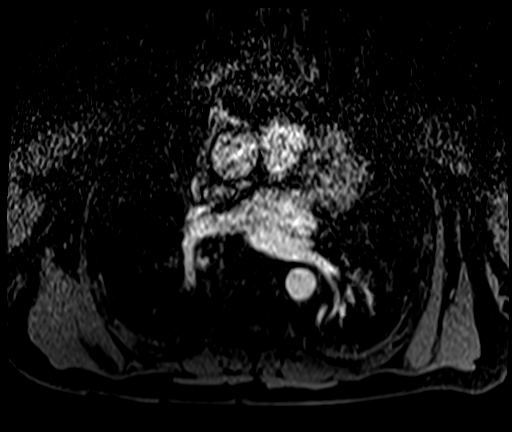

[Series 21: T1 dynamic post-contrast · axial · 2.5mm · 0.74mm/px · z∈[-42,+195]mm · 3 of 96 slices shown (2 of 2)]
[im 1/96]
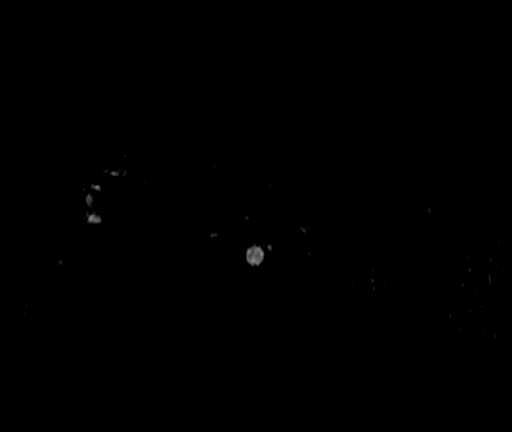
[im 48/96]
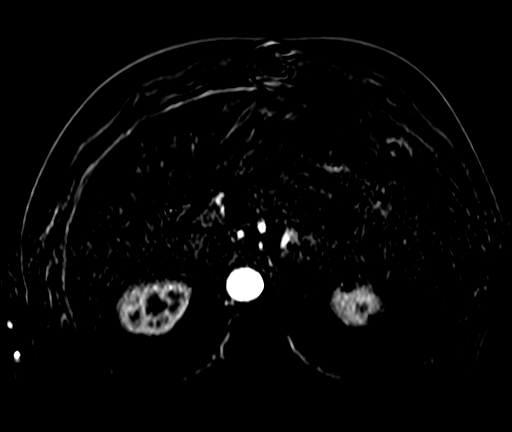
[im 96/96]
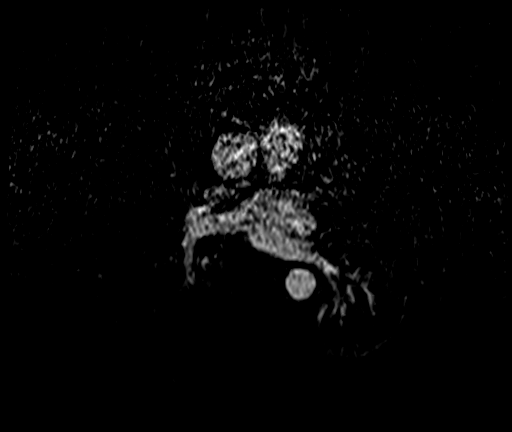

[25 of 48 positions shown; findings below may reference images not displayed]

FINDINGS: Lower chest: No acute findings.

Hepatobiliary: No focal liver abnormality. Portal vein remains
patent. Gallbladder appears surgically absent. No signs of biliary
ductal dilatation.

Pancreas:

-the dominant cystic lesion within tail of pancreas is again noted
and appears unchanged measuring 2.9 by 1.9 cm, image [DATE]. No signs
of internal enhancement within this structure.

Additional multiple cystic lesions are scattered throughout the
pancreas including:

-1.2 cm lesion within body of pancreas, image [DATE]. Previously this
measured the same.

-cystic lesion arising off the posterior aspect of the pancreatic
head measures 0.8 cm, image [DATE]. Previously this measured the same.

-Cystic lesion arising off the anterior lateral aspect of the
pancreatic head measures 0.9 cm, image [DATE]. Also unchanged.

No significant main duct dilatation.

Spleen:  Within normal limits in size and appearance.

Adrenals/Urinary Tract: Normal appearance of the adrenal glands.
Numerous kidney cysts are noted. Some of the cysts are complicated
by hemorrhagic debris. These appears similar to the previous exam.
The largest cyst arises off the inferior pole of the left kidney
measuring 2.3 cm.

No enhancing kidney mass or hydronephrosis.

Stomach/Bowel: Visualized portions within the abdomen are
unremarkable.

Vascular/Lymphatic: No pathologically enlarged lymph nodes
identified. No abdominal aortic aneurysm demonstrated.

Other:  No free fluid or fluid collections.

Musculoskeletal: No suspicious bone lesions identified.
IMPRESSION: 1. Stable appearance of cystic lesions within the pancreas measuring
up to 2.9 cm. Continued interval follow-up is recommended. According
to consensus criteria the next follow-up exam should be obtained in
6 months with repeat pancreas protocol MRI or CT without and with
contrast material. This recommendation follows ACR consensus
guidelines: Management of Incidental Pancreatic Cysts: A White Paper
of the ACR Incidental Findings Committee. [HOSPITAL]
[WC];[DATE].
2. Stable appearance of numerous kidney cysts. Some of the cysts are
complicated by hemorrhagic debris. These appears similar to the
previous exam.

## 2020-08-21 MED ORDER — GADOBENATE DIMEGLUMINE 529 MG/ML IV SOLN
15.0000 mL | Freq: Once | INTRAVENOUS | Status: AC | PRN
  Administered 2020-08-21: 14 mL via INTRAVENOUS

## 2020-11-30 ENCOUNTER — Ambulatory Visit: Payer: Medicare PPO

## 2020-12-14 ENCOUNTER — Other Ambulatory Visit (HOSPITAL_BASED_OUTPATIENT_CLINIC_OR_DEPARTMENT_OTHER): Payer: Self-pay

## 2020-12-14 ENCOUNTER — Ambulatory Visit: Payer: Medicare PPO | Attending: Internal Medicine

## 2020-12-14 DIAGNOSIS — Z23 Encounter for immunization: Secondary | ICD-10-CM

## 2020-12-14 MED ORDER — PFIZER COVID-19 VAC BIVALENT 30 MCG/0.3ML IM SUSP
INTRAMUSCULAR | 0 refills | Status: DC
  Filled 2020-12-14: qty 0.3, 1d supply, fill #0

## 2020-12-14 NOTE — Progress Notes (Signed)
   Covid-19 Vaccination Clinic  Name:  William Hernandez    MRN: [id number] DOB: [birth year]  12/14/2020  [name] was observed post Covid-19 immunization for 15 minutes without incident. He was provided with Vaccine Information Sheet and instruction to access the V-Safe system.   [name] was instructed to call 911 with any severe reactions post vaccine: Difficulty breathing  Swelling of face and throat  A fast heartbeat  A bad rash all over body  Dizziness and weakness

## 2020-12-25 ENCOUNTER — Other Ambulatory Visit: Payer: Self-pay | Admitting: Cardiovascular Disease

## 2021-01-12 ENCOUNTER — Other Ambulatory Visit: Payer: Self-pay | Admitting: Cardiovascular Disease

## 2021-01-14 ENCOUNTER — Encounter: Payer: Self-pay | Admitting: Cardiovascular Disease

## 2021-01-14 NOTE — Progress Notes (Signed)
Cardiology Office Note   Date:  01/15/2021   ID:  William Hernandez, DOB [birth year], MRN [id number]  PCP:  Kaleen Mask, MD  Cardiologist:   Kristeen Miss, MD   Chief Complaint  Patient presents with   Coronary Artery Disease   1. CAD ( CABG Oct. 6, 2003)   2. Dyslipidemia 3. Atrial fibrillation - post op from CABG.  4. Depression     William Hernandez is a 75 year old gentleman with a history of coronary disease-status post coronary artery bypass grafting.  He's done very well from a cardiac standpoint. He is participating in cardiac rehabilitation and has not had any episodes of chest pain or shortness breath.  He has done well.  His lipids are well controlled.    He is participating in cardiac rehab and is doing well.  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 He doesn't have any episodes of chest pain or shortness breath when he is active.  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  He continues to have issues with anxiety.   July 14, 2012:  William Hernandez is doing well.  He had a nose bleed several weeks ago.  He is exercising regularly.  No angina. No dyspnea.  Oct. 27, 2014:  He is doing well.   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  2 very hard games of 3 on 3 , half court game.  No pain.    Active. Exercises.   Increased his Trazadone to help with some additional depression.   July 14 2013:  William Hernandez is doing well.  He remains very active.  Plays in the Senior  games.   No CP.    Has has a URI for the past 10 days which has slowed him down a bit.   Oct. 26, 2015:  William Hernandez is doing well.   Bp is well controlled. Doing lots of fishing. Plays basketball on Wednesdays. No Cp .   Jul 21, 2014:  William Hernandez is a 75 y.o. male who presents for follow up of his CAD and hyperlipidemia Still exercising well.   Still playing basketball regularly.  No angina    01/17/2015: Doing well from  a cardiac standpoint. Has had a head cold - difficulty hearing in his left ear   June 19, 2015:  William Hernandez is seen for follow up visit .  Still playing basketball regularly . No  CP or dyspnea.     Aug 02, 2015:  William Hernandez is seen back for an episode CP that occurred in Cardiac rehab yesterday   Feels it at cardiac rehab Also when he first starts out playing basket ball After he gets warmed up, he feels fine.  Also occurs when he is mowing the grass.    myoview today shows no large area of ishcmeia. LV function is mildly depressed with EF 45%   Sept. 7, 2017:  William Hernandez is seen today . Had a cardiac cath in May, 2017: 1. Severe three-vessel native coronary artery disease 2. Status post CABG with patent RIMA to PDA, LIMA to LAD, sequential saphenous vein graft to OM1 and OM2, and saphenous vein graft to first diagonal 3. 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 No angina   June 03, 2016:  Still playing basketball , no angina  Still fly fishing   Oct. 23, 2018:  Doing very well.  No angina  Plays basket ball regularly . Goes to cardiac rehab.  No angina   April 25 , 2109:  William Hernandez is doing well   Patrici Ranks the senior games softball throw today .   Will be playing on BB team  Exercises regularly , no angina   January 13, 2019:  William Hernandez is seen today for follow-up visit.  I saw him in April via telemedicine visit.  He is done very well.  He is not having any episodes of chest discomfort.  He remains very active.  Was diagnosed with Lyme disease Is on Doxycycline.  Is improving  Has been found to have several cysts in his pancreas. Unchanged from previous CT scans..  Had 2 friends pass away recently .  Oct. 28, 2021: William Hernandez is seen today for follow up of his CAD, CABG, HLD  Upset.   Friend and fellow fisherman, Juliane Lack passed away of a MI recently .  William Hernandez is doing ok  Still active, [facility] [employer] [employer] regularly .  No  CP  Has a pancreatic cyst.  Is doing well from that standpoint .   Oct. 31, 2022: William Hernandez is seen today for follow up of his CAD, CABG, HLD Still very active  Still shooting basket ball  Just celebrated his 19th year following his CABG Has done some pond fishing recently .  Has been trout fishing a few times   He was having lots of muscle aches.  He reduced his atorvastatin to every other day and his muscle aches have improved.  We will be checking his lipids, ALT and basic metabolic profile today to make sure that he is stable following the reduction of his dose of atorvastatin.  Past Medical History:  Diagnosis Date   Arrhythmia    afib   Coronary artery disease    2003   Depression    Hyperlipidemia    S/P hernia surgery     Past Surgical History:  Procedure Laterality Date   CARDIAC CATHETERIZATION     11/2001   CARDIAC CATHETERIZATION N/A 08/04/2015   Procedure: Left Heart Cath and Cors/Grafts Angiography;  Surgeon: Tonny Bollman, MD;  Location: [facility] INVASIVE CV LAB;  Service: Cardiovascular;  Laterality: N/A;   CHOLECYSTECTOMY     CORONARY ARTERY BYPASS GRAFT  2000   HERNIA REPAIR       Current Outpatient Medications  Medication Sig Dispense Refill   aspirin 81 MG tablet Take 81 mg by mouth at bedtime.      atorvastatin (LIPITOR) 20 MG tablet TAKE 1 TABLET BY MOUTH ONCE DAILY (Patient taking differently: Take 20 mg by mouth every other day.) 90 tablet 3   COVID-19 mRNA bivalent vaccine, Pfizer, (PFIZER COVID-19 VAC BIVALENT) injection Inject into the muscle. 0.3 mL 0   COVID-19 mRNA Vac-TriS, Pfizer, SUSP injection Inject into the muscle. 0.3 mL 0   doxycycline (VIBRA-TABS) 100 MG tablet Take 1 tablet by mouth 2 (two) times daily.     famotidine (PEPCID) 20 MG tablet Take 20 mg by mouth daily.     fish oil-omega-3 fatty acids 1000 MG capsule Take 1 g by mouth daily.      Flaxseed, Linseed, (FLAX SEED OIL PO) Take 1 tablet by mouth daily.      irbesartan (AVAPRO) 150 MG  tablet TAKE 1/2 TABLET BY MOUTH 2 TIMES DAILY. 90 tablet 3   loratadine (CLARITIN) 10 MG tablet Take 10 mg by mouth daily.     Multiple Vitamin (MULTIVITAMIN PO)  Take 1 tablet by mouth daily.      nebivolol (BYSTOLIC) 2.5 MG tablet TAKE 1 TABLET (2.5 MG TOTAL) BY MOUTH DAILY. 30 tablet 0   nitroGLYCERIN (NITROSTAT) 0.4 MG SL tablet Place 1 tablet (0.4 mg total) under the tongue every 5 (five) minutes as needed for chest pain. 25 tablet 3   omeprazole (PRILOSEC) 20 MG capsule Take 20 mg by mouth daily.     sildenafil (REVATIO) 20 MG tablet as directed. Take 1-5 tablets by mouth daily as needed     traZODone (DESYREL) 150 MG tablet Take 200 mg by mouth at bedtime as needed for sleep.   0   No current facility-administered medications for this visit.    Allergies:   Ace inhibitors, Lisinopril, and Penicillins    Social History:  The patient  reports that he has never smoked. He has never used smokeless tobacco. He reports that he does not drink alcohol and does not use drugs.   Family History:  The patient's family history includes Heart disease in his father.   ROS:    Noted in current hx, otherwise negative   Physical Exam: Blood pressure 120/72, pulse (!) 59, height 5\' 8"  (1.727 m), weight 176 lb (79.8 kg), SpO2 98 %.  GEN:  Well nourished, well developed in no acute distress HEENT: Normal NECK: No JVD; No carotid bruits LYMPHATICS: No lymphadenopathy CARDIAC: RRR , no murmurs, rubs, gallops RESPIRATORY:  Clear to auscultation without rales, wheezing or rhonchi  ABDOMEN: Soft, non-tender, non-distended MUSCULOSKELETAL:  No edema; No deformity  SKIN: Warm and dry NEUROLOGIC:  Alert and oriented x 3   EKG:   Oct sinus bradycardia 59 beats a minute.  Occasional premature ventricular contraction.. 31, 2022:   Recent Labs: No results found for requested labs within last 8760 hours.    Lipid Panel    Component Value Date/Time   CHOL 130 01/13/2020 0830   TRIG 73 01/13/2020  0830   HDL 55 01/13/2020 0830   CHOLHDL 2.4 01/13/2020 0830   CHOLHDL 2.2 11/23/2015 0858   VLDL 14 11/23/2015 0858   LDLCALC 60 01/13/2020 0830      Wt Readings from Last 3 Encounters:  01/15/21 176 lb (79.8 kg)  01/13/20 179 lb 9.6 oz (81.5 kg)  01/13/19 181 lb 9.6 oz (82.4 kg)      Other studies Reviewed: Additional studies/ records that were reviewed today include: . Review of the above records demonstrates:    ASSESSMENT AND PLAN:  1. CAD ( CABG Oct. 6, 2003)   cath May 2017 -  1. Severe three-vessel native coronary artery disease 2. Status post CABG with patent RIMA to PDA, LIMA to LAD, sequential saphenous vein graft to OM1 and OM2, and saphenous vein graft to first diagonal 3. 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 Dyslipidemia-     currently on atorvastatin 20 mg every other day.  We will check lipids, ALT, basic metabolic profile today.  3. Atrial fibrillation -no evidence of atrial fibrillation currently.   4. Depression -   he sees BeckyKincaid on a regular basis.  5. HTN:     -Blood pressure is well controlled.  Current medicines are reviewed at length with the patient today.  The patient does not have concerns regarding medicines.  The following changes have been made:  no change  Labs/ tests ordered today include:   Orders Placed This Encounter  Procedures   ALT   Lipid panel  Basic metabolic panel    Disposition:   Follow up with me in 1 year   Kristeen Miss, MD  01/15/2021 10:41 AM    [facility] [address] [place], [place], [place]  [address] Phone: [phone]; Fax: [phone]

## 2021-01-15 ENCOUNTER — Encounter: Payer: Self-pay | Admitting: Cardiovascular Disease

## 2021-01-15 ENCOUNTER — Ambulatory Visit: Payer: Medicare PPO | Admitting: Cardiovascular Disease

## 2021-01-15 ENCOUNTER — Other Ambulatory Visit: Payer: Self-pay

## 2021-01-15 VITALS — BP 120/72 | HR 59 | Ht 68.0 in | Wt 176.0 lb

## 2021-01-15 DIAGNOSIS — I25810 Atherosclerosis of coronary artery bypass graft(s) without angina pectoris: Secondary | ICD-10-CM

## 2021-01-15 DIAGNOSIS — I10 Essential (primary) hypertension: Secondary | ICD-10-CM | POA: Diagnosis not present

## 2021-01-15 DIAGNOSIS — E782 Mixed hyperlipidemia: Secondary | ICD-10-CM | POA: Diagnosis not present

## 2021-01-15 LAB — LIPID PANEL
Chol/HDL Ratio: 2.5 ratio (ref 0.0–5.0)
Cholesterol, Total: 140 mg/dL (ref 100–199)
HDL: 57 mg/dL (ref >39)
LDL Chol Calc (NIH): 64 mg/dL (ref 0–99)
Triglycerides: 101 mg/dL (ref 0–149)
VLDL Cholesterol Cal: 19 mg/dL (ref 5–40)

## 2021-01-15 LAB — BASIC METABOLIC PANEL
BUN/Creatinine Ratio: 14 (ref 10–24)
BUN: 12 mg/dL (ref 8–27)
CO2: 26 mmol/L (ref 20–29)
Calcium: 9.8 mg/dL (ref 8.6–10.2)
Chloride: 101 mmol/L (ref 96–106)
Creatinine, Ser: 0.88 mg/dL (ref 0.76–1.27)
Glucose: 159 mg/dL — ABNORMAL HIGH (ref 70–99)
Potassium: 4.2 mmol/L (ref 3.5–5.2)
Sodium: 139 mmol/L (ref 134–144)
eGFR: 89 mL/min/{1.73_m2} (ref >59)

## 2021-01-15 LAB — ALT: ALT: 16 IU/L (ref 0–44)

## 2021-01-15 NOTE — Patient Instructions (Signed)
Medication Instructions:  Your physician recommends that you continue on your current medications as directed. Please refer to the Current Medication list given to you today.  *If you need a refill on your cardiac medications before your next appointment, please call your pharmacy*   Lab Work: TODAY: LIPIDS, ALT, BMP If you have labs (blood work) drawn today and your tests are completely normal, you will receive your results only by: MyChart Message (if you have MyChart) OR A paper copy in the mail If you have any lab test that is abnormal or we need to change your treatment, we will call you to review the results.   Testing/Procedures: NONE   Follow-Up: At [facility], you and your health needs are our priority.  As part of our continuing mission to provide you with exceptional heart care, we have created designated Provider Care Teams.  These Care Teams include your primary Cardiologist (physician) and Advanced Practice Providers (APPs -  Physician Assistants and Nurse Practitioners) who all work together to provide you with the care you need, when you need it.  We recommend signing up for the patient portal called "MyChart".  Sign up information is provided on this After Visit Summary.  MyChart is used to connect with patients for Virtual Visits (Telemedicine).  Patients are able to view lab/test results, encounter notes, upcoming appointments, etc.  Non-urgent messages can be sent to your provider as well.   To learn more about what you can do with MyChart, go to [email or web address].    Your next appointment:   1 year(s)  The format for your next appointment:   In Person  Provider:   Kristeen Miss, MD

## 2021-01-18 NOTE — Addendum Note (Signed)
Addended by: Sheppard Coil on: 01/18/2021 11:20 AM   Modules accepted: Orders

## 2021-01-22 ENCOUNTER — Other Ambulatory Visit: Payer: Self-pay | Admitting: Cardiovascular Disease

## 2021-02-27 ENCOUNTER — Other Ambulatory Visit: Payer: Self-pay | Admitting: Gastroenterology

## 2021-02-27 DIAGNOSIS — K862 Cyst of pancreas: Secondary | ICD-10-CM

## 2021-02-28 ENCOUNTER — Other Ambulatory Visit: Payer: Self-pay | Admitting: Cardiovascular Disease

## 2021-03-17 ENCOUNTER — Other Ambulatory Visit: Payer: Self-pay

## 2021-03-17 ENCOUNTER — Ambulatory Visit
Admission: RE | Admit: 2021-03-17 | Discharge: 2021-03-17 | Disposition: A | Discharge status: Home / Self Care | Payer: Medicare PPO | Source: Ambulatory Visit | Attending: Gastroenterology | Admitting: Gastroenterology

## 2021-03-17 DIAGNOSIS — K862 Cyst of pancreas: Secondary | ICD-10-CM

## 2021-03-17 IMAGING — MR MR ABDOMEN WO/W CM MRCP
14 of 23 series · 25 of 48 positions shown · IV contrast (15 ML MULTIHANCE)
Comparison: Abdominal MRI [DATE].

CLINICAL DATA: 75-year-old male with history of pancreatic cystic
lesions. Follow-up study.

EXAM:
MRI ABDOMEN WITHOUT AND WITH CONTRAST (INCLUDING MRCP)
TECHNIQUE: Multiplanar multisequence MR imaging of the abdomen was performed
both before and after the administration of intravenous contrast.
Heavily T2-weighted images of the biliary and pancreatic ducts were
obtained, and three-dimensional MRCP images were rendered by post
processing.
CONTRAST:  15mL MULTIHANCE GADOBENATE DIMEGLUMINE 529 MG/ML IV SOLN

[Series 3: cor haste · coronal · 5.0mm · 0.74mm/px · 1 of 41 slices shown]
[im 1/41]
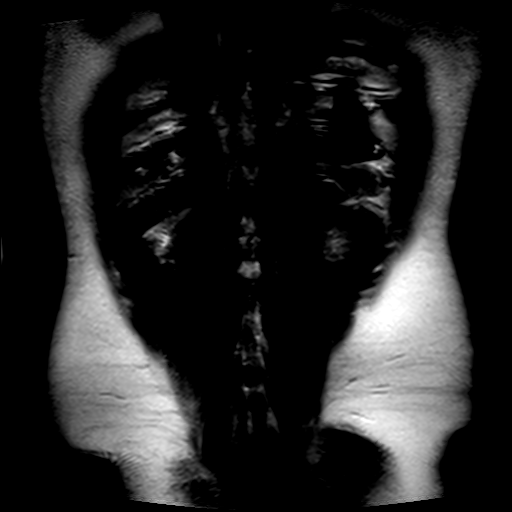

[Series 4: axial haste · axial · 6.0mm · 0.78mm/px · 1 of 35 slices shown]
[im 1/35]
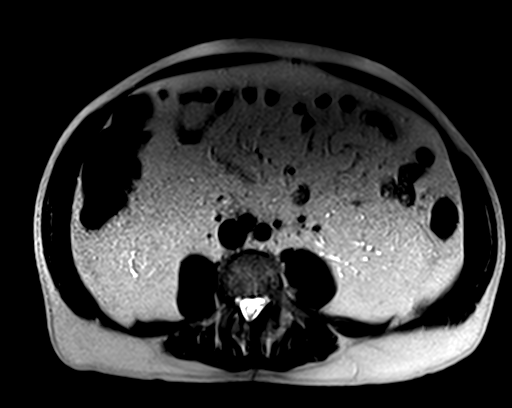

[Series 7: ep2d_diff_b50_500_800_p2_trig · axial · 6.0mm · 2.03mm/px · z∈[-59,+172]mm · 3 of 98 slices shown]
[im 1/98]
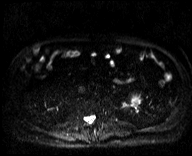
[im 49/98]
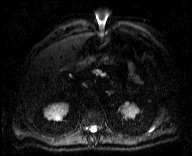
[im 98/98]
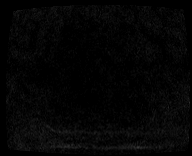

[Series 8: ep2d_diff_b50_500_800_p2_trig_adc · axial · 6.0mm · 2.03mm/px · 1 of 33 slices shown]
[im 1/33]
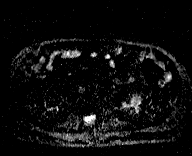

[Series 9: bSSFP · axial · 4.0mm · 0.72mm/px · z∈[-98,+142]mm · 2 of 61 slices shown (1 of 2)]
[im 1/61]
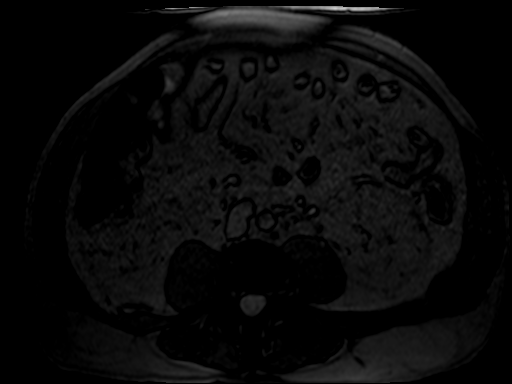
[im 61/61]
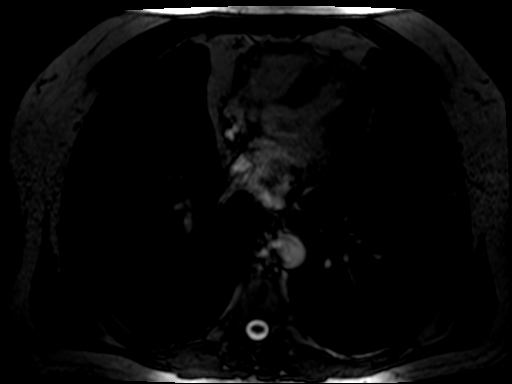

[Series 12: bSSFP · coronal · 5.0mm · 0.78mm/px · 1 of 34 slices shown (2 of 2)]
[im 1/34]
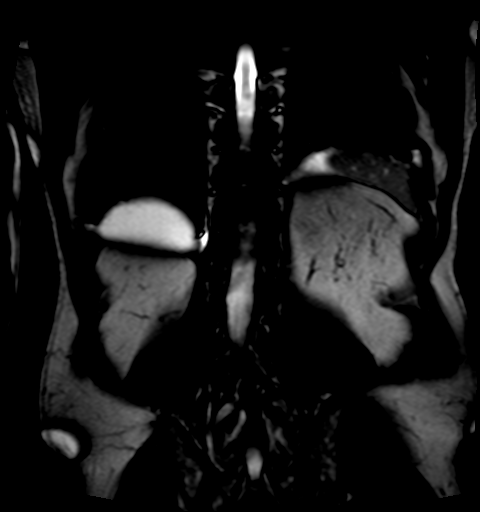

[Series 13: T2 · coronal · 3.0mm · 0.70mm/px · 2 of 56 slices shown]
[im 1/56]
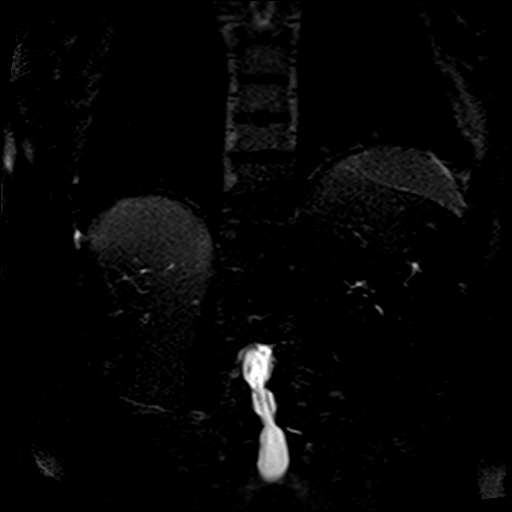
[im 56/56]
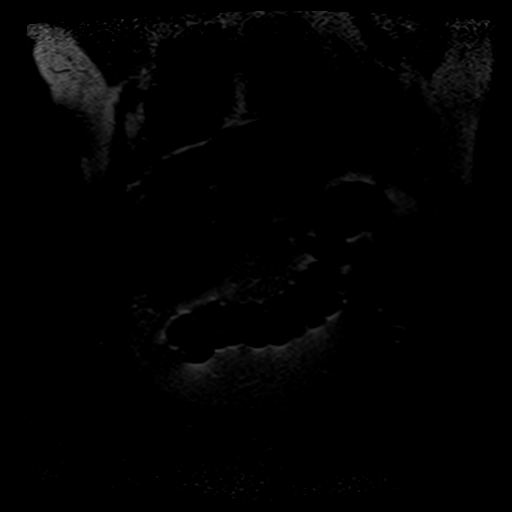

[Series 17: T2 fat-sat · axial · 6.0mm · 1.09mm/px · 1 of 32 slices shown (1 of 2)]
[im 1/32]
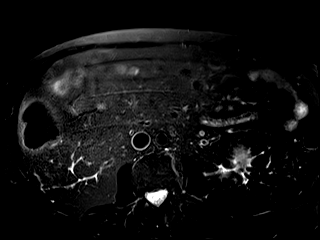

[Series 18: T1 · axial · 6.0mm · 0.74mm/px · z∈[-94,+137]mm · 2 of 72 slices shown]
[im 1/72]
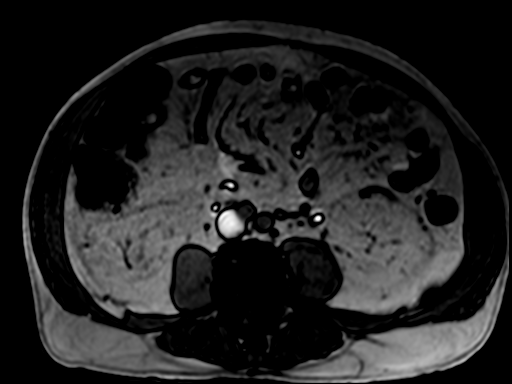
[im 72/72]
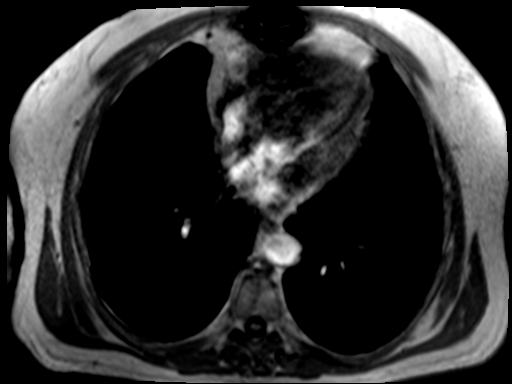

[Series 19: T2 fat-sat · axial · 6.0mm · 1.09mm/px · 1 of 32 slices shown (2 of 2)]
[im 1/32]
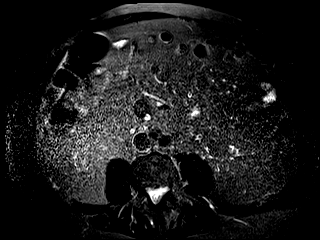

[Series 20: T1 dynamic · axial · non-contrast · 2.5mm · 0.74mm/px · z∈[-97,+140]mm · 3 of 96 slices shown]
[im 1/96]
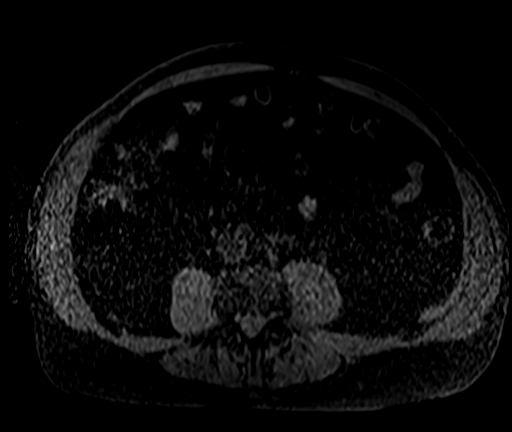
[im 48/96]
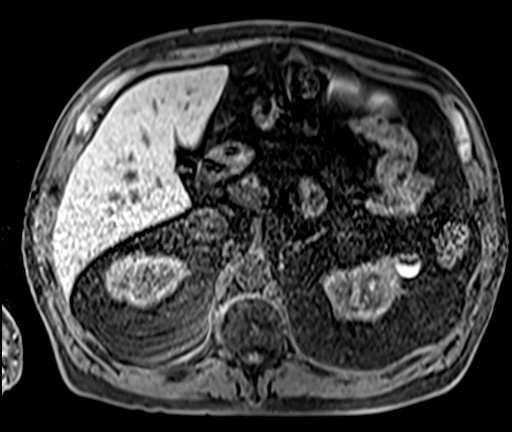
[im 96/96]
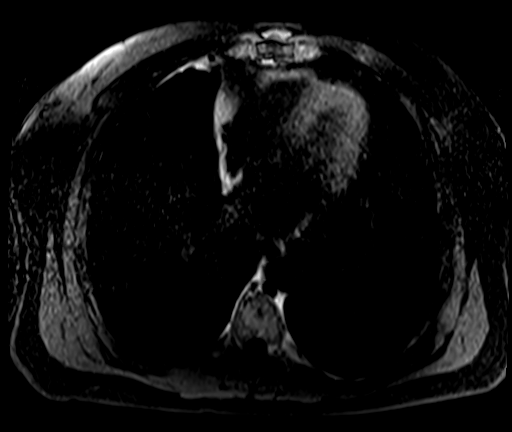

[Series 21: T1 dynamic post-contrast · axial · 2.5mm · 0.74mm/px · z∈[-97,+140]mm · 3 of 96 slices shown (1 of 3)]
[im 1/96]
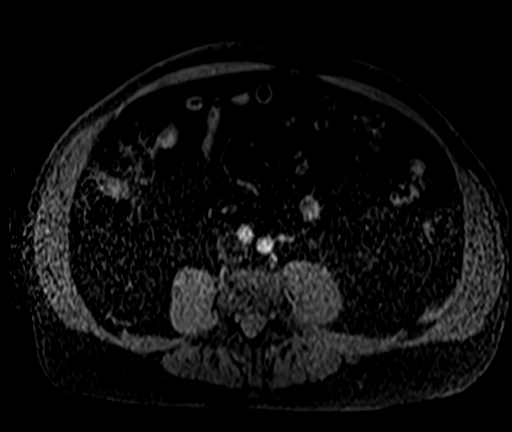
[im 48/96]
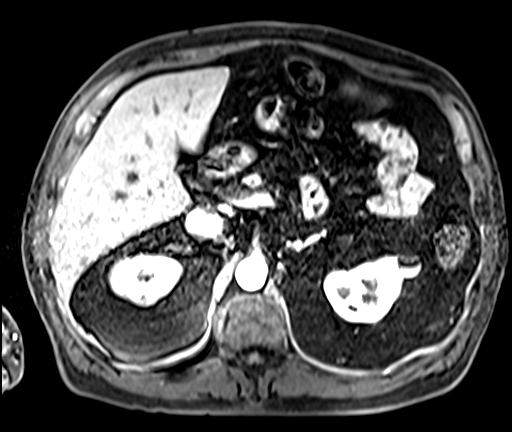
[im 96/96]
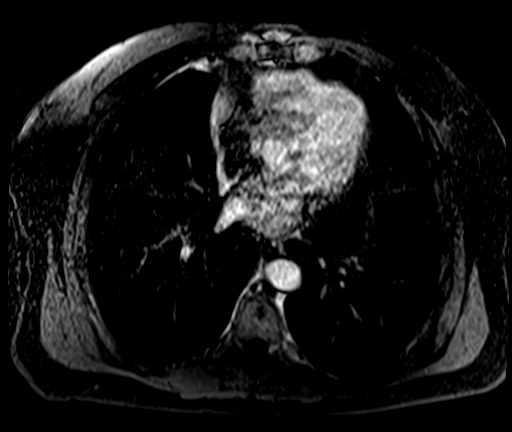

[Series 22: T1 dynamic post-contrast · axial · 2.5mm · 0.74mm/px · z∈[-97,+140]mm · 3 of 96 slices shown (2 of 3)]
[im 1/96]
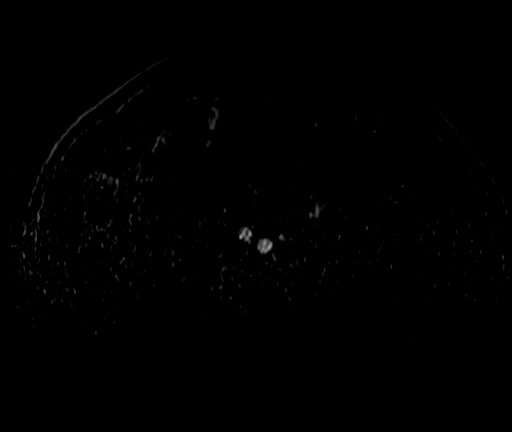
[im 48/96]
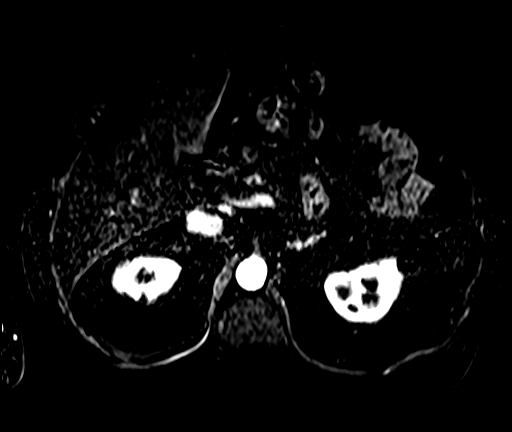
[im 96/96]
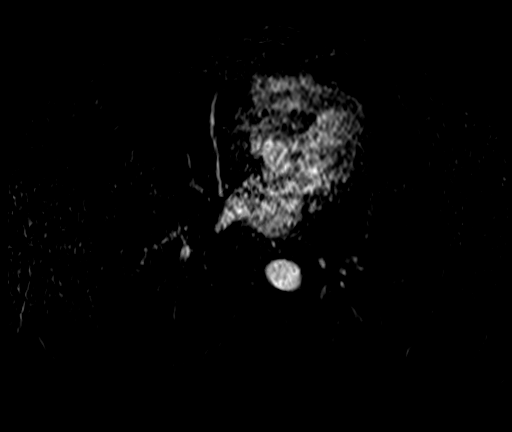

[Series 23: T1 dynamic post-contrast · axial · 2.5mm · 0.74mm/px · 1 of 96 slices shown (3 of 3)]
[im 1/96]
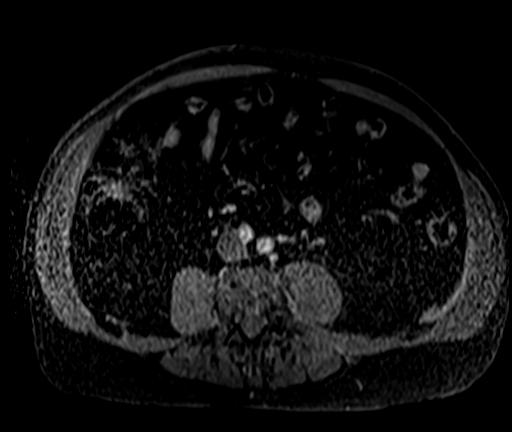

[25 of 48 positions shown; findings below may reference images not displayed]

FINDINGS: Lower chest: Unremarkable.

Hepatobiliary: No suspicious cystic or solid hepatic lesions. No
intra or extrahepatic biliary ductal dilatation noted on MRCP
images. Common bile duct measures 4 mm in the porta hepatis. Status
post cholecystectomy.

Pancreas: Innumerable small T1 hypointense, T2 hyperintense,
nonenhancing lesions are again noted throughout the pancreas very
similar to the prior examinations, largest of which is in the
pancreatic tail (axial image 13 of series 4 and coronal image 16 of
series 3) measuring 2.4 x 1.7 x 2.0 cm. No definite communication
with the main pancreatic duct. Main pancreatic duct is ectatic
measuring up to 3 mm. No definite enhancing soft tissue mass
identified in the pancreas. No peripancreatic fluid collections or
inflammatory changes.

Spleen:  Unremarkable.

Adrenals/Urinary Tract: Multiple T1 hypointense, T2 hyperintense,
nonenhancing lesions in both kidneys, compatible with simple cysts,
largest of which is in the medial aspect of the lower pole of the
left kidney measuring 2.8 cm in diameter. Other T1 hyperintense, T2
hypointense, nonenhancing lesions in the kidneys
(left-greater-than-right) are compatible with
proteinaceous/hemorrhagic cysts, largest of which is in the
interpolar region of the left kidney measuring 2.2 cm in diameter.
No suspicious renal lesions. No hydroureteronephrosis in the
visualized portions of the abdomen. Bilateral adrenal glands are
normal in appearance.

Stomach/Bowel: Visualized portions are unremarkable.

Vascular/Lymphatic: No aneurysm identified in the visualized
abdominal vasculature. No lymphadenopathy noted in the abdomen.

Other: No significant volume of ascites noted in the visualized
portions of the peritoneal cavity.

Musculoskeletal: No aggressive appearing osseous lesions are noted
in the visualized portions of the skeleton.
IMPRESSION: 1. Multiple small cystic lesions in the pancreas, similar to prior
studies, largest of which measures 2.4 x 1.7 x 2.0 cm in the tail,
most compatible with small pancreatic pseudocysts and/or chronic
areas of side branch ectasia. Side branch IPMN (intraductal
papillary mucinous neoplasm) is not excluded. Repeat abdominal MRI
with and without IV gadolinium with MRCP is recommended in 6 months
to ensure continued stability. This recommendation follows ACR
consensus guidelines: Management of Incidental Pancreatic Cysts: A
White Paper of the ACR Incidental Findings Committee. [HOSPITAL] [7G];[DATE].
2. Multiple Bosniak class 1 and Bosniak class 2 cysts in the kidneys
bilaterally, similar to the prior study, as above.

## 2021-03-17 MED ORDER — GADOBENATE DIMEGLUMINE 529 MG/ML IV SOLN
15.0000 mL | Freq: Once | INTRAVENOUS | Status: AC | PRN
  Administered 2021-03-17: 15 mL via INTRAVENOUS

## 2021-09-03 ENCOUNTER — Other Ambulatory Visit: Payer: Self-pay | Admitting: Gastroenterology

## 2021-09-03 DIAGNOSIS — K862 Cyst of pancreas: Secondary | ICD-10-CM

## 2021-09-24 ENCOUNTER — Other Ambulatory Visit: Payer: Medicare PPO

## 2021-09-25 ENCOUNTER — Ambulatory Visit
Admission: RE | Admit: 2021-09-25 | Discharge: 2021-09-25 | Disposition: A | Discharge status: Home / Self Care | Payer: Medicare PPO | Source: Ambulatory Visit | Attending: Gastroenterology | Admitting: Gastroenterology

## 2021-09-25 DIAGNOSIS — K862 Cyst of pancreas: Secondary | ICD-10-CM

## 2021-09-25 MED ORDER — GADOBENATE DIMEGLUMINE 529 MG/ML IV SOLN
16.0000 mL | Freq: Once | INTRAVENOUS | Status: AC | PRN
  Administered 2021-09-25: 16 mL via INTRAVENOUS

## 2021-12-21 ENCOUNTER — Telehealth: Payer: Self-pay | Admitting: Cardiovascular Disease

## 2021-12-21 DIAGNOSIS — R7303 Prediabetes: Secondary | ICD-10-CM

## 2021-12-21 DIAGNOSIS — I25810 Atherosclerosis of coronary artery bypass graft(s) without angina pectoris: Secondary | ICD-10-CM

## 2021-12-21 DIAGNOSIS — E782 Mixed hyperlipidemia: Secondary | ICD-10-CM

## 2021-12-21 NOTE — Telephone Encounter (Signed)
Patient is requesting orders to have A1C and lipid panels prior to 11/15 appointment with Dr. Acie Fredrickson.

## 2021-12-21 NOTE — Telephone Encounter (Signed)
Orders placed at this time for lipids, ALT, BMET, and HgbA1C per pt request. Called to schedule, but no answer. LM to call back to schedule lab appt.

## 2022-01-01 ENCOUNTER — Other Ambulatory Visit (HOSPITAL_BASED_OUTPATIENT_CLINIC_OR_DEPARTMENT_OTHER): Payer: Self-pay

## 2022-01-01 MED ORDER — COMIRNATY 30 MCG/0.3ML IM SUSY
PREFILLED_SYRINGE | INTRAMUSCULAR | 0 refills | Status: DC
  Filled 2022-01-01: qty 0.3, 1d supply, fill #0

## 2022-01-09 ENCOUNTER — Other Ambulatory Visit: Payer: Self-pay | Admitting: Internal Medicine

## 2022-01-18 ENCOUNTER — Other Ambulatory Visit: Payer: Self-pay | Admitting: Cardiovascular Disease

## 2022-01-28 ENCOUNTER — Ambulatory Visit: Payer: Medicare PPO | Attending: Cardiovascular Disease

## 2022-01-28 DIAGNOSIS — E782 Mixed hyperlipidemia: Secondary | ICD-10-CM

## 2022-01-28 DIAGNOSIS — I25810 Atherosclerosis of coronary artery bypass graft(s) without angina pectoris: Secondary | ICD-10-CM

## 2022-01-28 DIAGNOSIS — R7303 Prediabetes: Secondary | ICD-10-CM

## 2022-01-29 ENCOUNTER — Encounter: Payer: Self-pay | Admitting: Cardiovascular Disease

## 2022-01-29 LAB — BASIC METABOLIC PANEL
BUN/Creatinine Ratio: 17 (ref 10–24)
BUN: 16 mg/dL (ref 8–27)
CO2: 24 mmol/L (ref 20–29)
Calcium: 9.6 mg/dL (ref 8.6–10.2)
Chloride: 103 mmol/L (ref 96–106)
Creatinine, Ser: 0.96 mg/dL (ref 0.76–1.27)
Glucose: 111 mg/dL — ABNORMAL HIGH (ref 70–99)
Potassium: 4.8 mmol/L (ref 3.5–5.2)
Sodium: 142 mmol/L (ref 134–144)
eGFR: 81 mL/min/{1.73_m2} (ref >59)

## 2022-01-29 LAB — LIPID PANEL
Chol/HDL Ratio: 2.7 ratio (ref 0.0–5.0)
Cholesterol, Total: 144 mg/dL (ref 100–199)
HDL: 54 mg/dL (ref >39)
LDL Chol Calc (NIH): 72 mg/dL (ref 0–99)
Triglycerides: 95 mg/dL (ref 0–149)
VLDL Cholesterol Cal: 18 mg/dL (ref 5–40)

## 2022-01-29 LAB — ALT: ALT: 15 IU/L (ref 0–44)

## 2022-01-29 LAB — HEMOGLOBIN A1C
Est. average glucose Bld gHb Est-mCnc: 120 mg/dL
Hgb A1c MFr Bld: 5.8 % — ABNORMAL HIGH (ref 4.8–5.6)

## 2022-01-29 NOTE — Progress Notes (Unsigned)
Cardiology Office Note   Date:  01/30/2022   ID:  [name], DOB 03/20/1945, MRN [id number]  PCP:  Kaleen Mask, MD  Cardiologist:   Kristeen Miss, MD   Chief Complaint  Patient presents with   Coronary Artery Disease   Hypertension        1. CAD ( CABG Oct. 6, 2003)   2. Dyslipidemia 3. Atrial fibrillation - post op from CABG.  4. Depression     William Hernandez is a 76 year old gentleman with a history of coronary disease-status post coronary artery bypass grafting.  He's done very well from a cardiac standpoint. He is participating in cardiac rehabilitation and has not had any episodes of chest pain or shortness breath.  He has done well.  His lipids are well controlled.    He is participating in cardiac rehab and is doing well.  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 He doesn't have any episodes of chest pain or shortness breath when he is active.  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  He continues to have issues with anxiety.   July 14, 2012:  William Hernandez is doing well.  He had a nose bleed several weeks ago.  He is exercising regularly.  No angina. No dyspnea.  Oct. 27, 2014:  He is doing well.   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  2 very hard games of 3 on 3 , half court game.  No pain.    Active. Exercises.   Increased his Trazadone to help with some additional depression.   July 14 2013:  William Hernandez is doing well.  He remains very active.  Plays in the Senior  games.   No CP.    Has has a URI for the past 10 days which has slowed him down a bit.   Oct. 26, 2015:  William Hernandez is doing well.   Bp is well controlled. Doing lots of fishing. Plays basketball on Wednesdays. No Cp .   Jul 21, 2014:  William Hernandez is a 76 y.o. male who presents for follow up of his CAD and hyperlipidemia Still exercising well.   Still playing basketball regularly.  No angina     01/17/2015: Doing well from a cardiac standpoint. Has had a head cold - difficulty hearing in his left ear   June 19, 2015:  William Hernandez is seen for follow up visit .  Still playing basketball regularly . No  CP or dyspnea.     Aug 02, 2015:  William Hernandez is seen back for an episode CP that occurred in Cardiac rehab yesterday   Feels it at cardiac rehab Also when he first starts out playing basket ball After he gets warmed up, he feels fine.  Also occurs when he is mowing the grass.    myoview today shows no large area of ishcmeia. LV function is mildly depressed with EF 45%   Sept. 7, 2017:  William Hernandez is seen today . Had a cardiac cath in May, 2017: 1. Severe three-vessel native coronary artery disease 2. Status post CABG with patent RIMA to PDA, LIMA to LAD, sequential saphenous vein graft to OM1 and OM2, and saphenous vein graft to first diagonal 3. 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 No angina   June 03, 2016:  Still playing basketball , no angina  Still fly fishing  Oct. 23, 2018:  Doing very well.  No angina  Plays basket ball regularly . Goes to cardiac rehab.  No angina   April 25 , 2109:  William Hernandez is doing well   Patrici Ranks the senior games softball throw today .   Will be playing on BB team  Exercises regularly , no angina   January 13, 2019:  William Hernandez is seen today for follow-up visit.  I saw him in April via telemedicine visit.  He is done very well.  He is not having any episodes of chest discomfort.  He remains very active.  Was diagnosed with Lyme disease Is on Doxycycline.  Is improving  Has been found to have several cysts in his pancreas. Unchanged from previous CT scans..  Had 2 friends pass away recently .  Oct. 28, 2021: William Hernandez is seen today for follow up of his CAD, CABG, HLD  Upset.   Friend and fellow fisherman, Juliane Lack passed away of a MI recently .  William Hernandez is doing ok  Still active, [place]  [employer] .  No CP  Has a pancreatic cyst.  Is doing well from that standpoint .   Oct. 31, 2022: William Hernandez is seen today for follow up of his CAD, CABG, HLD Still very active  Still shooting basket ball  Just celebrated his 19th year following his CABG Has done some pond fishing recently .  Has been trout fishing a few times   He was having lots of muscle aches.  He reduced his atorvastatin to every other day and his muscle aches have improved.  We will be checking his lipids, ALT and basic metabolic profile today to make sure that he is stable following the reduction of his dose of atorvastatin.  January 29, 2022: William Hernandez seen today for follow-up visit regarding his coronary artery disease, coronary artery bypass grafting, hyperlipidemia.  Still very active. Plays basketball 3 days a week     he is taking atorvastatin 20 mg every other day.  He was having too much muscle aches.  We will add Zetia 10 mg a day.  Check lipids again in 3 months.  Had COVID in Feb.   Is on Trilogy   Has had 2 COVID shots + 5+ booster .    Past Medical History:  Diagnosis Date   Arrhythmia    afib   Coronary artery disease    2003   Depression    Hyperlipidemia    S/P hernia surgery     Past Surgical History:  Procedure Laterality Date   CARDIAC CATHETERIZATION     11/2001   CARDIAC CATHETERIZATION N/A 08/04/2015   Procedure: Left Heart Cath and Cors/Grafts Angiography;  Surgeon: Tonny Bollman, MD;  Location: [facility] INVASIVE CV LAB;  Service: Cardiovascular;  Laterality: N/A;   CHOLECYSTECTOMY     CORONARY ARTERY BYPASS GRAFT  2000   HERNIA REPAIR       Current Outpatient Medications  Medication Sig Dispense Refill   aspirin 81 MG tablet Take 81 mg by mouth at bedtime.      atorvastatin (LIPITOR) 20 MG tablet TAKE 1 TABLET BY MOUTH ONCE DAILY 90 tablet 3   famotidine (PEPCID) 20 MG tablet Take 20 mg by mouth daily.     fish oil-omega-3 fatty acids 1000 MG capsule Take 1 g  by mouth daily.      irbesartan (AVAPRO) 150 MG tablet TAKE 1/2 TABLET BY MOUTH 2 TIMES DAILY. 90 tablet 0   loratadine (  CLARITIN) 10 MG tablet Take 10 mg by mouth daily.     Multiple Vitamin (MULTIVITAMIN PO) Take 1 tablet by mouth daily.      nebivolol (BYSTOLIC) 2.5 MG tablet AKE 1 TABLET (2.5 MG TOTAL) BY MOUTH DAILY. 90 tablet 0   nitroGLYCERIN (NITROSTAT) 0.4 MG SL tablet Place 1 tablet (0.4 mg total) under the tongue every 5 (five) minutes as needed for chest pain. 25 tablet 3   omeprazole (PRILOSEC) 20 MG capsule Take 20 mg by mouth daily.     sildenafil (REVATIO) 20 MG tablet as directed. Take 1-5 tablets by mouth daily as needed     traZODone (DESYREL) 150 MG tablet Take 200 mg by mouth at bedtime as needed for sleep.   0   doxycycline (VIBRA-TABS) 100 MG tablet Take 1 tablet by mouth 2 (two) times daily. (Patient not taking: Reported on 01/30/2022)     No current facility-administered medications for this visit.    Allergies:   Ace inhibitors, Lisinopril, and Penicillins    Social History:  The patient  reports that he has never smoked. He has never used smokeless tobacco. He reports that he does not drink alcohol and does not use drugs.   Family History:  The patient's family history includes Heart disease in his father.   ROS:    Noted in current hx, otherwise negative    Physical Exam: Blood pressure 124/72, pulse 64, height 5\' 7"  (1.702 m), weight 174 lb 3.2 oz (79 kg), SpO2 96 %.       GEN:  Well nourished, well developed in no acute distress HEENT: Normal NECK: No JVD; No carotid bruits LYMPHATICS: No lymphadenopathy CARDIAC: RRR , no murmurs, rubs, gallops RESPIRATORY:  Clear to auscultation without rales, wheezing or rhonchi  ABDOMEN: Soft, non-tender, non-distended MUSCULOSKELETAL:  No edema; No deformity  SKIN: Warm and dry NEUROLOGIC:  Alert and oriented x 3    EKG:    Nov. 15, 2023.  NSR at 64.   LVH with repol abn.    Recent Labs: 01/28/2022:  ALT 15; BUN 16; Creatinine, Ser 0.96; Potassium 4.8; Sodium 142    Lipid Panel    Component Value Date/Time   CHOL 144 01/28/2022 1327   TRIG 95 01/28/2022 1327   HDL 54 01/28/2022 1327   CHOLHDL 2.7 01/28/2022 1327   CHOLHDL 2.2 11/23/2015 0858   VLDL 14 11/23/2015 0858   LDLCALC 72 01/28/2022 1327      Wt Readings from Last 3 Encounters:  01/30/22 174 lb 3.2 oz (79 kg)  01/15/21 176 lb (79.8 kg)  01/13/20 179 lb 9.6 oz (81.5 kg)      Other studies Reviewed: Additional studies/ records that were reviewed today include: . Review of the above records demonstrates:    ASSESSMENT AND PLAN:  1. CAD ( CABG Oct. 6, 2003)   cath May 2017 -  1. Severe three-vessel native coronary artery disease 2. Status post CABG with patent RIMA to PDA, LIMA to LAD, sequential saphenous vein graft to OM1 and OM2, and saphenous vein graft to first diagonal 3. 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 Dyslipidemia-      he is taking atorvastatin 20 mg every other day.  He was having too much muscle aches.  We will add Zetia 10 mg a day.  Check lipids again in 3 months.  3. Atrial fibrillation  :  no recurrent Afib    4. Depression -  5. HTN:     - well controlled.   Current medicines are reviewed at length with the patient today.  The patient does not have concerns regarding medicines.  The following changes have been made:  no change  Labs/ tests ordered today include:   No orders of the defined types were placed in this encounter.   Disposition:      Kristeen MissPhilip Kevonna Nolte, MD  01/30/2022 3:58 PM    [facility] [address], [place], [place]  [address] Phone: [phone]; Fax: [phone]

## 2022-01-30 ENCOUNTER — Encounter: Payer: Self-pay | Admitting: Cardiovascular Disease

## 2022-01-30 ENCOUNTER — Ambulatory Visit: Payer: Medicare PPO | Attending: Cardiovascular Disease | Admitting: Cardiovascular Disease

## 2022-01-30 VITALS — BP 124/72 | HR 64 | Ht 67.0 in | Wt 174.2 lb

## 2022-01-30 DIAGNOSIS — I10 Essential (primary) hypertension: Secondary | ICD-10-CM | POA: Diagnosis not present

## 2022-01-30 DIAGNOSIS — I2510 Atherosclerotic heart disease of native coronary artery without angina pectoris: Secondary | ICD-10-CM | POA: Diagnosis not present

## 2022-01-30 MED ORDER — EZETIMIBE 10 MG PO TABS: 10.0000 mg | ORAL_TABLET | Freq: Every day | ORAL | 3 refills | Status: DC

## 2022-01-30 NOTE — Patient Instructions (Addendum)
Medication Instructions:  START ZETIA 10 MG 1 EVERY DAY  *If you need a refill on your cardiac medications before your next appointment, please call your pharmacy*   Lab Work: LIPID AND ALT  IN 3 MONTHS DUE FEBRUARY  If you have labs (blood work) drawn today and your tests are completely normal, you will receive your results only by: MyChart Message (if you have MyChart) OR A paper copy in the mail If you have any lab test that is abnormal or we need to change your treatment, we will call you to review the results.   Testing/Procedures: NONE   Follow-Up: At [facility], you and your health needs are our priority.  As part of our continuing mission to provide you with exceptional heart care, we have created designated Provider Care Teams.  These Care Teams include your primary Cardiologist (physician) and Advanced Practice Providers (APPs -  Physician Assistants and Nurse Practitioners) who all work together to provide you with the care you need, when you need it.  We recommend signing up for the patient portal called "MyChart".  Sign up information is provided on this After Visit Summary.  MyChart is used to connect with patients for Virtual Visits (Telemedicine).  Patients are able to view lab/test results, encounter notes, upcoming appointments, etc.  Non-urgent messages can be sent to your provider as well.   To learn more about what you can do with MyChart, go to [email or web address].    Your next appointment:   1 year(s)  The format for your next appointment:   In Person  Provider:   Kristeen Miss, MD     Other Instructions NONE  Important Information About Sugar

## 2022-03-15 ENCOUNTER — Encounter: Payer: Self-pay | Admitting: Internal Medicine

## 2022-03-25 ENCOUNTER — Other Ambulatory Visit: Payer: Self-pay | Admitting: Cardiovascular Disease

## 2022-04-03 ENCOUNTER — Other Ambulatory Visit: Payer: Self-pay | Admitting: Gastroenterology

## 2022-04-03 ENCOUNTER — Other Ambulatory Visit: Payer: Self-pay | Admitting: Cardiovascular Disease

## 2022-04-03 DIAGNOSIS — K862 Cyst of pancreas: Secondary | ICD-10-CM

## 2022-04-03 DIAGNOSIS — N281 Cyst of kidney, acquired: Secondary | ICD-10-CM

## 2022-04-16 ENCOUNTER — Other Ambulatory Visit: Payer: Self-pay | Admitting: Cardiovascular Disease

## 2022-04-19 ENCOUNTER — Encounter: Payer: Self-pay | Admitting: Gastroenterology

## 2022-04-21 ENCOUNTER — Ambulatory Visit
Admission: RE | Admit: 2022-04-21 | Discharge: 2022-04-21 | Disposition: A | Discharge status: Home / Self Care | Payer: Medicare PPO | Source: Ambulatory Visit | Attending: Gastroenterology | Admitting: Gastroenterology

## 2022-04-21 DIAGNOSIS — K862 Cyst of pancreas: Secondary | ICD-10-CM

## 2022-04-21 DIAGNOSIS — N281 Cyst of kidney, acquired: Secondary | ICD-10-CM

## 2022-04-21 MED ORDER — GADOPICLENOL 0.5 MMOL/ML IV SOLN
8.0000 mL | Freq: Once | INTRAVENOUS | Status: AC | PRN
  Administered 2022-04-21: 8 mL via INTRAVENOUS

## 2022-05-02 ENCOUNTER — Ambulatory Visit: Payer: Medicare PPO | Attending: Cardiovascular Disease

## 2022-05-02 DIAGNOSIS — I2510 Atherosclerotic heart disease of native coronary artery without angina pectoris: Secondary | ICD-10-CM

## 2022-05-02 LAB — LIPID PANEL
Chol/HDL Ratio: 2.2 ratio (ref 0.0–5.0)
Cholesterol, Total: 115 mg/dL (ref 100–199)
HDL: 53 mg/dL (ref >39)
LDL Chol Calc (NIH): 47 mg/dL (ref 0–99)
Triglycerides: 71 mg/dL (ref 0–149)
VLDL Cholesterol Cal: 15 mg/dL (ref 5–40)

## 2022-05-02 LAB — ALT: ALT: 28 IU/L (ref 0–44)

## 2022-07-08 ENCOUNTER — Other Ambulatory Visit: Payer: Self-pay | Admitting: Cardiovascular Disease

## 2022-10-17 ENCOUNTER — Other Ambulatory Visit (HOSPITAL_BASED_OUTPATIENT_CLINIC_OR_DEPARTMENT_OTHER): Payer: Self-pay

## 2022-10-21 ENCOUNTER — Other Ambulatory Visit: Payer: Self-pay | Admitting: Gastroenterology

## 2022-10-21 DIAGNOSIS — N281 Cyst of kidney, acquired: Secondary | ICD-10-CM

## 2022-11-20 ENCOUNTER — Other Ambulatory Visit (HOSPITAL_BASED_OUTPATIENT_CLINIC_OR_DEPARTMENT_OTHER): Payer: Self-pay

## 2022-11-24 ENCOUNTER — Ambulatory Visit
Admission: RE | Admit: 2022-11-24 | Discharge: 2022-11-24 | Disposition: A | Discharge status: Home / Self Care | Payer: Medicare PPO | Source: Ambulatory Visit | Attending: Gastroenterology | Admitting: Gastroenterology

## 2022-11-24 DIAGNOSIS — N281 Cyst of kidney, acquired: Secondary | ICD-10-CM

## 2022-11-24 MED ORDER — GADOPICLENOL 0.5 MMOL/ML IV SOLN
8.0000 mL | Freq: Once | INTRAVENOUS | Status: AC | PRN
  Administered 2022-11-24: 8 mL via INTRAVENOUS

## 2022-11-26 ENCOUNTER — Other Ambulatory Visit (HOSPITAL_BASED_OUTPATIENT_CLINIC_OR_DEPARTMENT_OTHER): Payer: Self-pay

## 2022-11-26 MED ORDER — COMIRNATY 30 MCG/0.3ML IM SUSY
0.3000 mL | PREFILLED_SYRINGE | Freq: Once | INTRAMUSCULAR | 0 refills | Status: AC
  Filled 2022-11-26: qty 0.3, 1d supply, fill #0

## 2023-01-09 ENCOUNTER — Other Ambulatory Visit: Payer: Self-pay | Admitting: Cardiovascular Disease

## 2023-02-02 ENCOUNTER — Encounter: Payer: Self-pay | Admitting: Cardiovascular Disease

## 2023-02-02 NOTE — Progress Notes (Unsigned)
Cardiology Office Note   Date:  02/03/2023   ID:  William Hernandez, DOB 10-May-1945, MRN [id number]  PCP:  Kaleen Mask, MD  Cardiologist:   Kristeen Miss, MD   Chief Complaint  Patient presents with   Coronary Artery Disease        Hyperlipidemia   1. CAD ( CABG Oct. 6, 2003)   2. Dyslipidemia 3. Atrial fibrillation - post op from CABG.  4. Depression     William Hernandez is a 77 year old gentleman with a history of coronary disease-status post coronary artery bypass grafting.  He's done very well from a cardiac standpoint. He is participating in cardiac rehabilitation and has not had any episodes of chest pain or shortness breath.  He has done well.  His lipids are well controlled.    He is participating in cardiac rehab and is doing well.  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 He doesn't have any episodes of chest pain or shortness breath when he is active.  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  He continues to have issues with anxiety.   July 14, 2012:  William Hernandez is doing well.  He had a nose bleed several weeks ago.  He is exercising regularly.  No angina. No dyspnea.  Oct. 27, 2014:  He is doing well.   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  2 very hard games of 3 on 3 , half court game.  No pain.    Active. Exercises.   Increased his Trazadone to help with some additional depression.   July 14 2013:  William Hernandez is doing well.  He remains very active.  Plays in the Senior  games.   No CP.    Has has a URI for the past 10 days which has slowed him down a bit.   Oct. 26, 2015:  William Hernandez is doing well.   Bp is well controlled. Doing lots of fishing. Plays basketball on Wednesdays. No Cp .   Jul 21, 2014:  William Hernandez is a 77 y.o. male who presents for follow up of his CAD and hyperlipidemia Still exercising well.   Still playing basketball regularly.  No angina     01/17/2015: Doing well from a cardiac standpoint. Has had a head cold - difficulty hearing in his left ear   June 19, 2015:  William Hernandez is seen for follow up visit .  Still playing basketball regularly . No  CP or dyspnea.     Aug 02, 2015:  William Hernandez is seen back for an episode CP that occurred in Cardiac rehab yesterday   Feels it at cardiac rehab Also when he first starts out playing basket ball After he gets warmed up, he feels fine.  Also occurs when he is mowing the grass.    myoview today shows no large area of ishcmeia. LV function is mildly depressed with EF 45%   Sept. 7, 2017:  William Hernandez is seen today . Had a cardiac cath in May, 2017: 1. Severe three-vessel native coronary artery disease 2. Status post CABG with patent RIMA to PDA, LIMA to LAD, sequential saphenous vein graft to OM1 and OM2, and saphenous vein graft to first diagonal 3. 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 No angina   June 03, 2016:  Still playing basketball , no angina  Still fly fishing  Oct. 23, 2018:  Doing very well.  No angina  Plays basket ball regularly . Goes to cardiac rehab.  No angina   April 25 , 2109:  William Hernandez is doing well   Patrici Ranks the senior games softball throw today .   Will be playing on BB team  Exercises regularly , no angina   January 13, 2019:  William Hernandez is seen today for follow-up visit.  I saw him in April via telemedicine visit.  He is done very well.  He is not having any episodes of chest discomfort.  He remains very active.  Was diagnosed with Lyme disease Is on Doxycycline.  Is improving  Has been found to have several cysts in his pancreas. Unchanged from previous CT scans..  Had 2 friends pass away recently .  Oct. 28, 2021: William Hernandez is seen today for follow up of his CAD, CABG, HLD  Upset.   Friend and fellow fisherman, Juliane Lack passed away of a MI recently .  William Hernandez is doing ok  Still active, [facility]  [employer] .  No CP  Has a pancreatic cyst.  Is doing well from that standpoint .   Oct. 31, 2022: William Hernandez is seen today for follow up of his CAD, CABG, HLD Still very active  Still shooting basket ball  Just celebrated his 19th year following his CABG Has done some pond fishing recently .  Has been trout fishing a few times   He was having lots of muscle aches.  He reduced his atorvastatin to every other day and his muscle aches have improved.  We will be checking his lipids, ALT and basic metabolic profile today to make sure that he is stable following the reduction of his dose of atorvastatin.  January 29, 2022: William Hernandez seen today for follow-up visit regarding his coronary artery disease, coronary artery bypass grafting, hyperlipidemia.  Still very active. Plays basketball 3 days a week     he is taking atorvastatin 20 mg every other day.  He was having too much muscle aches.  We will add Zetia 10 mg a day.  Check lipids again in 3 months.  Had COVID in Feb.   Is on Trilogy   Has had 2 COVID shots + 5+ booster .    Nov. 18, 2024 William Hernandez is seen for follow up of his CAD  His senior basket ball team won the silver metal .  No CP , no dyspnea   Still playing lots of basket ball   Lipids look great  LDL is 47     Past Medical History:  Diagnosis Date   Arrhythmia    afib   Coronary artery disease    2003   Depression    Hyperlipidemia    S/P hernia surgery     Past Surgical History:  Procedure Laterality Date   CARDIAC CATHETERIZATION     11/2001   CARDIAC CATHETERIZATION N/A 08/04/2015   Procedure: Left Heart Cath and Cors/Grafts Angiography;  Surgeon: Tonny Bollman, MD;  Location: [facility] INVASIVE CV LAB;  Service: Cardiovascular;  Laterality: N/A;   CHOLECYSTECTOMY     CORONARY ARTERY BYPASS GRAFT  2000   HERNIA REPAIR       Current Outpatient Medications  Medication Sig Dispense Refill   aspirin 81 MG tablet Take 81 mg by mouth at bedtime.       atorvastatin (LIPITOR) 20 MG tablet TAKE 1 TABLET BY MOUTH ONCE DAILY (Patient taking differently: Every other day) 90  tablet 3   ezetimibe (ZETIA) 10 MG tablet TAKE 1 TABLET (10 MG TOTAL) BY MOUTH DAILY. 90 tablet 3   famotidine (PEPCID) 20 MG tablet Take 20 mg by mouth daily.     fish oil-omega-3 fatty acids 1000 MG capsule Take 1 g by mouth daily.      irbesartan (AVAPRO) 150 MG tablet TAKE 1/2 TABLET BY MOUTH 2 TIMES DAILY. 90 tablet 2   lamoTRIgine (LAMICTAL) 25 MG tablet Take by mouth. 2 tablets in the morning and 1 at bedtime     loratadine (CLARITIN) 10 MG tablet Take 10 mg by mouth daily.     Multiple Vitamin (MULTIVITAMIN PO) Take 1 tablet by mouth daily.      nebivolol (BYSTOLIC) 2.5 MG tablet Take 1 tablet (2.5 mg total) by mouth daily. 90 tablet 3   sildenafil (REVATIO) 20 MG tablet as directed. Take 1-5 tablets by mouth daily as needed     traZODone (DESYREL) 150 MG tablet Take 200 mg by mouth at bedtime as needed for sleep.   0   doxycycline (VIBRA-TABS) 100 MG tablet Take 1 tablet by mouth 2 (two) times daily. (Patient not taking: Reported on 01/30/2022)     nitroGLYCERIN (NITROSTAT) 0.4 MG SL tablet Place 1 tablet (0.4 mg total) under the tongue every 5 (five) minutes as needed for chest pain. 25 tablet 3   No current facility-administered medications for this visit.    Allergies:   Ace inhibitors, Lisinopril, and Penicillins    Social History:  The patient  reports that he has never smoked. He has never used smokeless tobacco. He reports that he does not drink alcohol and does not use drugs.   Family History:  The patient's family history includes Heart disease in his father.   ROS:    Noted in current hx, otherwise negative    Physical Exam: Blood pressure 124/76, pulse (!) 53, height 5\' 7"  (1.702 m), weight 172 lb 12.8 oz (78.4 kg), SpO2 97%.      GEN:  Well nourished, well developed in no acute distress HEENT: Normal NECK: No JVD; No carotid  bruits LYMPHATICS: No lymphadenopathy CARDIAC: RRR , no murmurs, rubs, gallops RESPIRATORY:  Clear to auscultation without rales, wheezing or rhonchi  ABDOMEN: Soft, non-tender, non-distended MUSCULOSKELETAL:  No edema; No deformity  SKIN: Warm and dry NEUROLOGIC:  Alert and oriented x 3    EKG:     EKG Interpretation Date/Time:  Monday February 03 2023 09:50:32 EST Ventricular Rate:  53 PR Interval:  182 QRS Duration:  114 QT Interval:  444 QTC Calculation: 416 R Axis:   -31  Text Interpretation: Sinus bradycardia Left axis deviation Moderate voltage criteria for LVH, may be normal variant ( R in aVL , Cornell product ) When compared with ECG of 04-Aug-2015 07:45, Questionable change in QRS duration Confirmed by Kristeen Miss ([id number]) on 02/03/2023 10:04:22 AM     Recent Labs: 05/02/2022: ALT 28    Lipid Panel    Component Value Date/Time   CHOL 115 05/02/2022 1033   TRIG 71 05/02/2022 1033   HDL 53 05/02/2022 1033   CHOLHDL 2.2 05/02/2022 1033   CHOLHDL 2.2 11/23/2015 0858   VLDL 14 11/23/2015 0858   LDLCALC 47 05/02/2022 1033      Wt Readings from Last 3 Encounters:  02/03/23 172 lb 12.8 oz (78.4 kg)  01/30/22 174 lb 3.2 oz (79 kg)  01/15/21 176 lb (79.8 kg)      Other studies Reviewed: Additional  studies/ records that were reviewed today include: . Review of the above records demonstrates:    ASSESSMENT AND PLAN:  1. CAD ( CABG Oct. 6, 2003)   cath May 2017 -  1. Severe three-vessel native coronary artery disease 2. Status post CABG with patent RIMA to PDA, LIMA to LAD, sequential saphenous vein graft to OM1 and OM2, and saphenous vein graft to first diagonal 3. 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 Dyslipidemia-      lipids looks great   3. Atrial fibrillation :   none since his CABG    4. Depression -     sees Emiliano Dyer regularly   5. HTN:       BP is well controlled.   Current  medicines are reviewed at length with the patient today.  The patient does not have concerns regarding medicines.  The following changes have been made:  no change  Labs/ tests ordered today include:   Orders Placed This Encounter  Procedures   EKG 12-Lead    Disposition:      Kristeen Miss, MD  02/03/2023 10:08 AM    [facility] [address] [place], [place], [place]  [address] Phone: [phone]; Fax: [phone]

## 2023-02-03 ENCOUNTER — Ambulatory Visit: Payer: Medicare PPO | Attending: Cardiovascular Disease | Admitting: Cardiovascular Disease

## 2023-02-03 VITALS — BP 124/76 | HR 53 | Ht 67.0 in | Wt 172.8 lb

## 2023-02-03 DIAGNOSIS — I2510 Atherosclerotic heart disease of native coronary artery without angina pectoris: Secondary | ICD-10-CM

## 2023-02-03 DIAGNOSIS — I10 Essential (primary) hypertension: Secondary | ICD-10-CM

## 2023-02-03 DIAGNOSIS — I25111 Atherosclerotic heart disease of native coronary artery with angina pectoris with documented spasm: Secondary | ICD-10-CM | POA: Diagnosis not present

## 2023-02-03 DIAGNOSIS — E785 Hyperlipidemia, unspecified: Secondary | ICD-10-CM

## 2023-02-03 MED ORDER — NITROGLYCERIN 0.4 MG SL SUBL: 0.4000 mg | SUBLINGUAL_TABLET | SUBLINGUAL | 3 refills | Status: AC | PRN

## 2023-02-03 NOTE — Patient Instructions (Signed)
Medication Instructions:  Your physician recommends that you continue on your current medications as directed. Please refer to the Current Medication list given to you today.  *If you need a refill on your cardiac medications before your next appointment, please call your pharmacy*  Lab Work: Your physician recommends that you have lab work today- Lipids, HbA1c, ALT, BMET. If you have labs (blood work) drawn today and your tests are completely normal, you will receive your results only by: MyChart Message (if you have MyChart) OR A paper copy in the mail If you have any lab test that is abnormal or we need to change your treatment, we will call you to review the results.   Testing/Procedures: None ordered today.  Follow-Up: At [facility], you and your health needs are our priority.  As part of our continuing mission to provide you with exceptional heart care, we have created designated Provider Care Teams.  These Care Teams include your primary Cardiologist (physician) and Advanced Practice Providers (APPs -  Physician Assistants and Nurse Practitioners) who all work together to provide you with the care you need, when you need it.  We recommend signing up for the patient portal called "MyChart".  Sign up information is provided on this After Visit Summary.  MyChart is used to connect with patients for Virtual Visits (Telemedicine).  Patients are able to view lab/test results, encounter notes, upcoming appointments, etc.  Non-urgent messages can be sent to your provider as well.   To learn more about what you can do with MyChart, go to [email or web address].    Your next appointment:   1 year(s)  Provider:   Orbie Pyo, MD

## 2023-02-04 LAB — BASIC METABOLIC PANEL WITH GFR
BUN/Creatinine Ratio: 16 (ref 10–24)
BUN: 14 mg/dL (ref 8–27)
CO2: 21 mmol/L (ref 20–29)
Calcium: 9.7 mg/dL (ref 8.6–10.2)
Chloride: 105 mmol/L (ref 96–106)
Creatinine, Ser: 0.88 mg/dL (ref 0.76–1.27)
Glucose: 126 mg/dL — ABNORMAL HIGH (ref 70–99)
Potassium: 4.4 mmol/L (ref 3.5–5.2)
Sodium: 138 mmol/L (ref 134–144)
eGFR: 88 mL/min/{1.73_m2}

## 2023-02-04 LAB — LIPID PANEL
Chol/HDL Ratio: 2.2 ratio (ref 0.0–5.0)
Cholesterol, Total: 120 mg/dL (ref 100–199)
HDL: 55 mg/dL
LDL Chol Calc (NIH): 50 mg/dL (ref 0–99)
Triglycerides: 73 mg/dL (ref 0–149)
VLDL Cholesterol Cal: 15 mg/dL (ref 5–40)

## 2023-02-04 LAB — ALT: ALT: 23 [IU]/L (ref 0–44)

## 2023-02-04 LAB — HEMOGLOBIN A1C
Est. average glucose Bld gHb Est-mCnc: 128 mg/dL
Hgb A1c MFr Bld: 6.1 % — ABNORMAL HIGH (ref 4.8–5.6)

## 2023-03-28 ENCOUNTER — Other Ambulatory Visit: Payer: Self-pay | Admitting: Cardiovascular Disease

## 2023-09-17 ENCOUNTER — Other Ambulatory Visit: Payer: Self-pay | Admitting: Cardiovascular Disease

## 2023-10-29 ENCOUNTER — Other Ambulatory Visit: Payer: Self-pay | Admitting: Gastroenterology

## 2023-10-29 DIAGNOSIS — K862 Cyst of pancreas: Secondary | ICD-10-CM

## 2023-11-23 ENCOUNTER — Ambulatory Visit
Admission: RE | Admit: 2023-11-23 | Discharge: 2023-11-23 | Disposition: A | Discharge status: Home / Self Care | Source: Ambulatory Visit | Attending: Gastroenterology | Admitting: Gastroenterology

## 2023-11-23 DIAGNOSIS — K862 Cyst of pancreas: Secondary | ICD-10-CM

## 2023-11-23 MED ORDER — GADOPICLENOL 0.5 MMOL/ML IV SOLN
9.0000 mL | Freq: Once | INTRAVENOUS | Status: AC | PRN
  Administered 2023-11-23: 9 mL via INTRAVENOUS

## 2023-11-25 LAB — LAB REPORT - SCANNED
A1c: 5.7
EGFR: 88

## 2023-12-11 ENCOUNTER — Other Ambulatory Visit (HOSPITAL_BASED_OUTPATIENT_CLINIC_OR_DEPARTMENT_OTHER): Payer: Self-pay

## 2023-12-30 ENCOUNTER — Other Ambulatory Visit: Payer: Self-pay

## 2024-01-01 MED ORDER — IRBESARTAN 150 MG PO TABS: 75.0000 mg | ORAL_TABLET | Freq: Two times a day (BID) | ORAL | 0 refills | Status: DC

## 2024-01-08 ENCOUNTER — Other Ambulatory Visit: Payer: Self-pay

## 2024-01-12 MED ORDER — EZETIMIBE 10 MG PO TABS: 10.0000 mg | ORAL_TABLET | Freq: Every day | ORAL | 0 refills | Status: DC

## 2024-01-23 NOTE — Progress Notes (Signed)
 Cardiology Office Note:   Date:  01/29/2024  ID:  William Hernandez, DOB 1945-05-28, MRN [id number] PCP:  Loring Tanda Mae, MD  [facility] Providers Cardiologist:  Wendel Haws, MD Referring MD: Loring Tanda Mae, *  Chief Complaint/Reason for Referral: Follow-up coronary artery disease heavily  ASSESSMENT:    1. Coronary artery disease involving native coronary artery of native heart without angina pectoris   2. Hyperlipidemia LDL goal <70   3. Aortic atherosclerosis   4. Essential hypertension   5. CKD (chronic kidney disease) stage 2, GFR 60-89 ml/min   6. Palpitations   7. BMI 27.0-27.9,adult     PLAN:   In order of problems listed above: Coronary disease: Status post multivessel CABG. continue aspirin  81 mg, atorvastatin  20 mg, as needed nitroglycerin .  Patient cautioned about sildenafil  and nitroglycerin  interaction.  He has not used sildenafil  recently.   Hyperlipidemia: Continue atorvastatin  20 mg, Zetia  10 mg.  LDL was 47 in October by PCP.  Will defer LP(a) testing and hs-CRP testing in this advanced age individual. Aortic atherosclerosis: Continue aspirin  81 mg, atorvastatin  20 mg Hypertension: Continue irbesartan  75 mg twice daily, Bystolic  2.5 mg.  Blood pressure is well-controlled today CKD stage II: Continue irbesartan  75 mg twice daily Palpitations: Monitor in September demonstrated sinus rhythm with PACs.  Patient has not really been bothered by palpitations recently.   Elevated BMI: Continue diet and exercise modification.            Dispo:  Return in about 1 year (around 01/28/2025).       I spent 35 minutes reviewing all clinical data during and prior to this visit including all relevant imaging studies, laboratories, clinical information from other health systems and prior notes from both Cardiology and other specialties, interviewing the patient, conducting a complete physical examination, and coordinating care in order to formulate a  comprehensive and personalized evaluation and treatment plan.   History of Present Illness:    FOCUSED PROBLEM LIST:   CAD  RIMA to PDA, LIMA to LAD, VG to OM1 to OM2, VG to D1 and VG to D2 2003 Open grafts patent aside from vein graft to D2 coronary angiography 2017 Hyperlipidemia Aortic atherosclerosis CT abdomen pelvis 2020 Hypertension Palpitations Sinus rhythm with PACs monitor 2025 CKD stage II BMI 12 February 2024:  Patient consents to use of AI scribe. The patient returns for routine follow-up.  He was seen last year and was doing well.  He is playing basketball on a regular basis.  He denied any chest pain or dyspnea.  He has no current chest pain or shortness of breath and remains active, playing competitive basketball three times a week. He used nitroglycerin  once for angina pain about eight months ago.  He was told that his blood work showed a slightly reduced eGFR and that his kidneys are a bit weak. He drinks a lot of water and urinates frequently. He had a kidney stone attack five years ago and has cysts in his kidneys and pancreas, which are monitored annually with no changes noted in the last scan.  He takes blood pressure medication, specifically ebsartan, and monitors his blood pressure regularly, reporting recent readings of 118/56 and 130/70. He also takes cholesterol medication and reports his cholesterol levels as 122 for total cholesterol, 47 for LDL, and 78 for triglycerides.  He had a heart monitor placed due to experiencing 'weird moments' when not eating regularly, but his A1c is 5.7, indicating good blood sugar control.  Current Medications: Current Meds  Medication Sig   aspirin  81 MG tablet Take 81 mg by mouth at bedtime.    atorvastatin  (LIPITOR) 20 MG tablet TAKE 1 TABLET BY MOUTH ONCE DAILY   ezetimibe  (ZETIA ) 10 MG tablet Take 1 tablet (10 mg total) by mouth daily.   famotidine (PEPCID) 20 MG tablet Take 20 mg by mouth daily.   fish  oil-omega-3 fatty acids 1000 MG capsule Take 1 g by mouth daily.    irbesartan  (AVAPRO ) 150 MG tablet Take 0.5 tablets (75 mg total) by mouth 2 (two) times daily.   lamoTRIgine (LAMICTAL) 25 MG tablet Take by mouth. 2 tablets in the morning and 1 at bedtime   loratadine (CLARITIN) 10 MG tablet Take 10 mg by mouth daily.   Multiple Vitamin (MULTIVITAMIN PO) Take 1 tablet by mouth daily.    nebivolol  (BYSTOLIC ) 2.5 MG tablet TAKE 1 TABLET (2.5 MG TOTAL) BY MOUTH DAILY.   nitroGLYCERIN  (NITROSTAT ) 0.4 MG SL tablet Place 1 tablet (0.4 mg total) under the tongue every 5 (five) minutes as needed for chest pain.   sildenafil  (REVATIO ) 20 MG tablet as directed. Take 1-5 tablets by mouth daily as needed   traZODone (DESYREL) 150 MG tablet Take 200 mg by mouth at bedtime as needed for sleep.      Review of Systems:   Please see the history of present illness.    All other systems reviewed and are negative.     EKGs/Labs/Other Test Reviewed:   EKG: 2024 now sinus bradycardia bradycardic with LAD  EKG Interpretation Date/Time:  Thursday January 29 2024 09:55:21 EST Ventricular Rate:  54 PR Interval:  186 QRS Duration:  104 QT Interval:  436 QTC Calculation: 413 R Axis:   -17  Text Interpretation: Sinus bradycardia with sinus arrhythmia Minimal voltage criteria for LVH, may be normal variant ( R in aVL ) When compared with ECG of 03-Feb-2023 09:50, No significant change was found Confirmed by Wendel Haws ([id number]) on 01/29/2024 10:32:21 AM        CARDIAC STUDIES: Refer to CV Procedures and Imaging Tabs   Risk Assessment/Calculations:          Physical Exam:   VS:  BP 130/70   Pulse (!) 57   Resp 16   Ht 5' 7 (1.702 m)   Wt 170 lb 6.4 oz (77.3 kg)   SpO2 95%   BMI 26.69 kg/m        Wt Readings from Last 3 Encounters:  01/29/24 170 lb 6.4 oz (77.3 kg)  02/03/23 172 lb 12.8 oz (78.4 kg)  01/30/22 174 lb 3.2 oz (79 kg)      GENERAL:  No apparent distress, AOx3 HEENT:  No  carotid bruits, +2 carotid impulses, no scleral icterus CAR: RRR no murmurs, gallops, rubs, or thrills RES:  Clear to auscultation bilaterally ABD:  Soft, nontender, nondistended, positive bowel sounds x 4 VASC:  +2 radial pulses, +2 carotid pulses NEURO:  CN 2-12 grossly intact; motor and sensory grossly intact PSYCH:  No active depression or anxiety EXT:  No edema, ecchymosis, or cyanosis  Signed, Allisa Einspahr K Aquilla Voiles, MD  01/29/2024 10:50 AM    [facility] [address] [place], [place], [place]  [address] Phone: [phone]; Fax: [phone]   Note:  This document was prepared using Dragon voice recognition software and may include unintentional dictation errors.

## 2024-01-29 ENCOUNTER — Encounter: Payer: Self-pay | Admitting: Internal Medicine

## 2024-01-29 ENCOUNTER — Ambulatory Visit: Attending: Internal Medicine | Admitting: Internal Medicine

## 2024-01-29 VITALS — BP 130/70 | HR 57 | Resp 16 | Ht 67.0 in | Wt 170.4 lb

## 2024-01-29 DIAGNOSIS — I700 Atherosclerosis of aorta: Secondary | ICD-10-CM

## 2024-01-29 DIAGNOSIS — I2510 Atherosclerotic heart disease of native coronary artery without angina pectoris: Secondary | ICD-10-CM | POA: Diagnosis not present

## 2024-01-29 DIAGNOSIS — I10 Essential (primary) hypertension: Secondary | ICD-10-CM | POA: Diagnosis not present

## 2024-01-29 DIAGNOSIS — R002 Palpitations: Secondary | ICD-10-CM

## 2024-01-29 DIAGNOSIS — Z6827 Body mass index (BMI) 27.0-27.9, adult: Secondary | ICD-10-CM

## 2024-01-29 DIAGNOSIS — E785 Hyperlipidemia, unspecified: Secondary | ICD-10-CM

## 2024-01-29 DIAGNOSIS — N182 Chronic kidney disease, stage 2 (mild): Secondary | ICD-10-CM

## 2024-01-29 NOTE — Patient Instructions (Signed)
 Medication Instructions:  No medication changes were made at this visit. Continue current regimen.   *If you need a refill on your cardiac medications before your next appointment, please call your pharmacy*  Lab Work: None ordered today. If you have labs (blood work) drawn today and your tests are completely normal, you will receive your results only by: MyChart Message (if you have MyChart) OR A paper copy in the mail If you have any lab test that is abnormal or we need to change your treatment, we will call you to review the results.  Testing/Procedures: None ordered today.  Follow-Up: At [facility], you and your health needs are our priority.  As part of our continuing mission to provide you with exceptional heart care, our providers are all part of one team.  This team includes your primary Cardiologist (physician) and Advanced Practice Providers or APPs (Physician Assistants and Nurse Practitioners) who all work together to provide you with the care you need, when you need it.  Your next appointment:   1 year(s)  Provider:   APP

## 2024-03-19 ENCOUNTER — Other Ambulatory Visit: Payer: Self-pay | Admitting: Physician Assistant

## 2024-04-09 ENCOUNTER — Other Ambulatory Visit: Payer: Self-pay | Admitting: Internal Medicine

## 2024-04-13 ENCOUNTER — Other Ambulatory Visit: Payer: Self-pay | Admitting: Internal Medicine

## 2024-04-13 ENCOUNTER — Other Ambulatory Visit (HOSPITAL_COMMUNITY): Payer: Self-pay

## 2024-04-13 MED ORDER — NEBIVOLOL HCL 2.5 MG PO TABS
2.5000 mg | ORAL_TABLET | Freq: Every day | ORAL | 3 refills | Status: AC
  Filled 2024-04-13: qty 90, 90d supply, fill #0
